# Patient Record
Sex: Male | Born: 1960
Health system: Southern US, Community
[De-identification: ages and names within clinical notes are randomized; demographics above are authoritative.]

## PROBLEM LIST (undated history)

## (undated) DIAGNOSIS — C61 Malignant neoplasm of prostate: Secondary | ICD-10-CM

## (undated) DIAGNOSIS — K219 Gastro-esophageal reflux disease without esophagitis: Principal | ICD-10-CM

## (undated) DIAGNOSIS — M7580 Other shoulder lesions, unspecified shoulder: Secondary | ICD-10-CM

## (undated) DIAGNOSIS — R9431 Abnormal electrocardiogram [ECG] [EKG]: Secondary | ICD-10-CM

## (undated) DIAGNOSIS — K222 Esophageal obstruction: Secondary | ICD-10-CM

## (undated) DIAGNOSIS — R011 Cardiac murmur, unspecified: Secondary | ICD-10-CM

## (undated) DIAGNOSIS — Z9889 Other specified postprocedural states: Secondary | ICD-10-CM

## (undated) DIAGNOSIS — D126 Benign neoplasm of colon, unspecified: Secondary | ICD-10-CM

## (undated) DIAGNOSIS — E785 Hyperlipidemia, unspecified: Secondary | ICD-10-CM

## (undated) DIAGNOSIS — R7303 Prediabetes: Secondary | ICD-10-CM

## (undated) DIAGNOSIS — D374 Neoplasm of uncertain behavior of colon: Secondary | ICD-10-CM

## (undated) DIAGNOSIS — I10 Essential (primary) hypertension: Secondary | ICD-10-CM

## (undated) DIAGNOSIS — K6389 Other specified diseases of intestine: Secondary | ICD-10-CM

## (undated) HISTORY — DX: Neoplasm of uncertain behavior of colon: D37.4

## (undated) HISTORY — DX: Gastro-esophageal reflux disease without esophagitis: K21.9

## (undated) HISTORY — PX: PROSTATE BIOPSY: SHX241

## (undated) HISTORY — DX: Benign neoplasm of colon, unspecified: D12.6

## (undated) HISTORY — DX: Esophageal obstruction: K22.2

## (undated) HISTORY — DX: Other specified diseases of intestine: K63.89

## (undated) HISTORY — DX: Essential (primary) hypertension: I10

## (undated) HISTORY — DX: Malignant neoplasm of prostate: C61

## (undated) HISTORY — DX: Other shoulder lesions, unspecified shoulder: M75.80

## (undated) HISTORY — DX: Hyperlipidemia, unspecified: E78.5

---

## 2002-08-26 ENCOUNTER — Emergency Department (HOSPITAL_COMMUNITY): Admission: EM | Admit: 2002-08-26 | Discharge: 2002-08-26 | Payer: Self-pay | Admitting: Emergency Medicine

## 2002-08-27 ENCOUNTER — Emergency Department (HOSPITAL_COMMUNITY): Admission: EM | Admit: 2002-08-27 | Discharge: 2002-08-27 | Payer: Self-pay | Admitting: Emergency Medicine

## 2004-11-28 ENCOUNTER — Ambulatory Visit: Payer: Self-pay | Admitting: Internal Medicine

## 2004-11-28 ENCOUNTER — Encounter: Admission: RE | Admit: 2004-11-28 | Discharge: 2004-11-28 | Payer: Self-pay | Admitting: Family Medicine

## 2007-11-27 ENCOUNTER — Ambulatory Visit: Payer: Self-pay | Admitting: Family Medicine

## 2007-11-27 DIAGNOSIS — I10 Essential (primary) hypertension: Secondary | ICD-10-CM | POA: Insufficient documentation

## 2007-12-01 LAB — CONVERTED CEMR LAB
ALT: 21 units/L (ref 0–53)
AST: 17 units/L (ref 0–37)
Albumin: 3.7 g/dL (ref 3.5–5.2)
Alkaline Phosphatase: 51 units/L (ref 39–117)
BUN: 10 mg/dL (ref 6–23)
Basophils Absolute: 0 10*3/uL (ref 0.0–0.1)
Basophils Relative: 0.1 % (ref 0.0–1.0)
Bilirubin, Direct: 0.1 mg/dL (ref 0.0–0.3)
CO2: 31 meq/L (ref 19–32)
Calcium: 9.1 mg/dL (ref 8.4–10.5)
Chloride: 103 meq/L (ref 96–112)
Cholesterol: 215 mg/dL (ref 0–200)
Creatinine, Ser: 1.1 mg/dL (ref 0.4–1.5)
Direct LDL: 155.4 mg/dL
Eosinophils Absolute: 0.2 10*3/uL (ref 0.0–0.6)
Eosinophils Relative: 3.1 % (ref 0.0–5.0)
GFR calc Af Amer: 93 mL/min
GFR calc non Af Amer: 77 mL/min
Glucose, Bld: 96 mg/dL (ref 70–99)
HCT: 42 % (ref 39.0–52.0)
HDL: 35 mg/dL — ABNORMAL LOW (ref >39.0)
Hemoglobin: 14.3 g/dL (ref 13.0–17.0)
Lymphocytes Relative: 39.5 % (ref 12.0–46.0)
MCHC: 34.2 g/dL (ref 30.0–36.0)
MCV: 78.7 fL (ref 78.0–100.0)
Monocytes Absolute: 0.6 10*3/uL (ref 0.2–0.7)
Monocytes Relative: 9.5 % (ref 3.0–11.0)
Neutro Abs: 2.7 10*3/uL (ref 1.4–7.7)
Neutrophils Relative %: 47.8 % (ref 43.0–77.0)
PSA: 1.14 ng/mL (ref 0.10–4.00)
Platelets: 219 10*3/uL (ref 150–400)
Potassium: 3.8 meq/L (ref 3.5–5.1)
RBC: 5.34 M/uL (ref 4.22–5.81)
RDW: 13.8 % (ref 11.5–14.6)
Sodium: 141 meq/L (ref 135–145)
TSH: 1.2 microintl units/mL (ref 0.35–5.50)
Total Bilirubin: 0.9 mg/dL (ref 0.3–1.2)
Total CHOL/HDL Ratio: 6.1
Total Protein: 7.4 g/dL (ref 6.0–8.3)
Triglycerides: 93 mg/dL (ref 0–149)
VLDL: 19 mg/dL (ref 0–40)
WBC: 5.8 10*3/uL (ref 4.5–10.5)

## 2007-12-31 ENCOUNTER — Ambulatory Visit: Payer: Self-pay | Admitting: Family Medicine

## 2007-12-31 DIAGNOSIS — E782 Mixed hyperlipidemia: Secondary | ICD-10-CM | POA: Insufficient documentation

## 2008-02-11 ENCOUNTER — Ambulatory Visit: Payer: Self-pay | Admitting: Family Medicine

## 2008-02-17 LAB — CONVERTED CEMR LAB
ALT: 19 units/L (ref 0–53)
AST: 22 units/L (ref 0–37)
BUN: 14 mg/dL (ref 6–23)
CO2: 32 meq/L (ref 19–32)
Calcium: 9.5 mg/dL (ref 8.4–10.5)
Chloride: 103 meq/L (ref 96–112)
Cholesterol: 149 mg/dL (ref 0–200)
Creatinine, Ser: 1.1 mg/dL (ref 0.4–1.5)
GFR calc Af Amer: 93 mL/min
GFR calc non Af Amer: 77 mL/min
Glucose, Bld: 96 mg/dL (ref 70–99)
HDL: 37.5 mg/dL — ABNORMAL LOW (ref >39.0)
LDL Cholesterol: 97 mg/dL (ref 0–99)
Potassium: 4.3 meq/L (ref 3.5–5.1)
Sodium: 140 meq/L (ref 135–145)
Total CHOL/HDL Ratio: 4
Triglycerides: 72 mg/dL (ref 0–149)
VLDL: 14 mg/dL (ref 0–40)

## 2008-02-29 ENCOUNTER — Telehealth (INDEPENDENT_AMBULATORY_CARE_PROVIDER_SITE_OTHER): Payer: Self-pay | Admitting: Internal Medicine

## 2008-05-02 ENCOUNTER — Ambulatory Visit: Payer: Self-pay | Admitting: Family Medicine

## 2008-05-06 ENCOUNTER — Telehealth (INDEPENDENT_AMBULATORY_CARE_PROVIDER_SITE_OTHER): Payer: Self-pay | Admitting: Internal Medicine

## 2008-05-12 ENCOUNTER — Ambulatory Visit: Payer: Self-pay | Admitting: Internal Medicine

## 2008-05-12 DIAGNOSIS — F528 Other sexual dysfunction not due to a substance or known physiological condition: Secondary | ICD-10-CM | POA: Insufficient documentation

## 2008-05-18 ENCOUNTER — Telehealth (INDEPENDENT_AMBULATORY_CARE_PROVIDER_SITE_OTHER): Payer: Self-pay | Admitting: Internal Medicine

## 2008-06-13 ENCOUNTER — Ambulatory Visit: Payer: Self-pay | Admitting: Family Medicine

## 2008-08-30 ENCOUNTER — Ambulatory Visit: Payer: Self-pay | Admitting: Family Medicine

## 2008-10-12 ENCOUNTER — Ambulatory Visit: Payer: Self-pay | Admitting: Family Medicine

## 2009-01-31 ENCOUNTER — Telehealth (INDEPENDENT_AMBULATORY_CARE_PROVIDER_SITE_OTHER): Payer: Self-pay | Admitting: Internal Medicine

## 2009-04-27 ENCOUNTER — Ambulatory Visit: Payer: Self-pay | Admitting: Family Medicine

## 2009-04-27 DIAGNOSIS — E669 Obesity, unspecified: Secondary | ICD-10-CM | POA: Insufficient documentation

## 2009-04-27 LAB — CONVERTED CEMR LAB
ALT: 24 units/L (ref 0–53)
AST: 24 units/L (ref 0–37)
BUN: 13 mg/dL (ref 6–23)
CO2: 29 meq/L (ref 19–32)
Calcium: 9.1 mg/dL (ref 8.4–10.5)
Chloride: 106 meq/L (ref 96–112)
Cholesterol: 157 mg/dL (ref 0–200)
Creatinine, Ser: 1 mg/dL (ref 0.4–1.5)
GFR calc non Af Amer: 102.57 mL/min (ref >60)
Glucose, Bld: 93 mg/dL (ref 70–99)
HDL: 37.6 mg/dL — ABNORMAL LOW (ref >39.00)
LDL Cholesterol: 105 mg/dL — ABNORMAL HIGH (ref 0–99)
PSA: 1.87 ng/mL (ref 0.10–4.00)
Potassium: 4.3 meq/L (ref 3.5–5.1)
Sodium: 140 meq/L (ref 135–145)
Total CHOL/HDL Ratio: 4
Triglycerides: 72 mg/dL (ref 0.0–149.0)
VLDL: 14.4 mg/dL (ref 0.0–40.0)

## 2009-05-25 ENCOUNTER — Ambulatory Visit: Payer: Self-pay | Admitting: Family Medicine

## 2009-05-25 DIAGNOSIS — J309 Allergic rhinitis, unspecified: Secondary | ICD-10-CM | POA: Insufficient documentation

## 2009-06-29 ENCOUNTER — Ambulatory Visit: Payer: Self-pay | Admitting: Family Medicine

## 2009-08-15 ENCOUNTER — Ambulatory Visit: Payer: Self-pay | Admitting: Family Medicine

## 2009-10-31 ENCOUNTER — Ambulatory Visit: Payer: Self-pay | Admitting: Family Medicine

## 2009-11-01 LAB — CONVERTED CEMR LAB
ALT: 20 units/L (ref 0–53)
AST: 17 units/L (ref 0–37)
BUN: 12 mg/dL (ref 6–23)
CO2: 29 meq/L (ref 19–32)
Calcium: 8.6 mg/dL (ref 8.4–10.5)
Chloride: 104 meq/L (ref 96–112)
Cholesterol: 141 mg/dL (ref 0–200)
Creatinine, Ser: 0.9 mg/dL (ref 0.4–1.5)
GFR calc non Af Amer: 115.58 mL/min (ref >60)
Glucose, Bld: 93 mg/dL (ref 70–99)
HDL: 41.3 mg/dL (ref >39.00)
LDL Cholesterol: 86 mg/dL (ref 0–99)
Potassium: 3.9 meq/L (ref 3.5–5.1)
Sodium: 141 meq/L (ref 135–145)
Total CHOL/HDL Ratio: 3
Triglycerides: 68 mg/dL (ref 0.0–149.0)
VLDL: 13.6 mg/dL (ref 0.0–40.0)

## 2009-11-20 ENCOUNTER — Ambulatory Visit: Payer: Self-pay | Admitting: Family Medicine

## 2010-01-31 ENCOUNTER — Ambulatory Visit: Payer: Self-pay | Admitting: Family Medicine

## 2010-04-25 ENCOUNTER — Ambulatory Visit: Payer: Self-pay | Admitting: Family Medicine

## 2010-11-12 ENCOUNTER — Ambulatory Visit: Payer: Self-pay | Admitting: Family Medicine

## 2010-11-13 LAB — CONVERTED CEMR LAB
ALT: 16 units/L (ref 0–53)
AST: 17 units/L (ref 0–37)
Albumin: 4 g/dL (ref 3.5–5.2)
Alkaline Phosphatase: 60 units/L (ref 39–117)
BUN: 14 mg/dL (ref 6–23)
Basophils Absolute: 0 10*3/uL (ref 0.0–0.1)
Bilirubin, Direct: 0.1 mg/dL (ref 0.0–0.3)
CO2: 30 meq/L (ref 19–32)
Calcium: 9.2 mg/dL (ref 8.4–10.5)
Chloride: 103 meq/L (ref 96–112)
Cholesterol: 214 mg/dL — ABNORMAL HIGH (ref 0–200)
Direct LDL: 153.3 mg/dL
Eosinophils Absolute: 0.2 10*3/uL (ref 0.0–0.7)
Eosinophils Relative: 3.1 % (ref 0.0–5.0)
GFR calc non Af Amer: 100.75 mL/min (ref >60)
Glucose, Bld: 115 mg/dL — ABNORMAL HIGH (ref 70–99)
HCT: 43.2 % (ref 39.0–52.0)
HDL: 42.8 mg/dL (ref >39.00)
Hemoglobin: 14.4 g/dL (ref 13.0–17.0)
Lymphocytes Relative: 46.4 % — ABNORMAL HIGH (ref 12.0–46.0)
Lymphs Abs: 3.4 10*3/uL (ref 0.7–4.0)
MCHC: 33.4 g/dL (ref 30.0–36.0)
MCV: 81.8 fL (ref 78.0–100.0)
Monocytes Absolute: 0.5 10*3/uL (ref 0.1–1.0)
Monocytes Relative: 7 % (ref 3.0–12.0)
Neutro Abs: 3.1 10*3/uL (ref 1.4–7.7)
Neutrophils Relative %: 43 % (ref 43.0–77.0)
Platelets: 235 10*3/uL (ref 150.0–400.0)
Potassium: 3.9 meq/L (ref 3.5–5.1)
RDW: 14.6 % (ref 11.5–14.6)
Sodium: 141 meq/L (ref 135–145)
TSH: 2.11 microintl units/mL (ref 0.35–5.50)
Total Bilirubin: 0.5 mg/dL (ref 0.3–1.2)
Triglycerides: 87 mg/dL (ref 0.0–149.0)
VLDL: 17.4 mg/dL (ref 0.0–40.0)
WBC: 7.3 10*3/uL (ref 4.5–10.5)

## 2010-11-28 ENCOUNTER — Ambulatory Visit: Payer: Self-pay | Admitting: Family Medicine

## 2011-01-15 NOTE — Assessment & Plan Note (Signed)
Summary: 2 MONTH FOLLOW UP/[facility]H   Vital Signs:  Patient profile:   50 year old male Height:      73 inches Weight:      265 pounds BMI:     35.09 Temp:     98.2 degrees F oral Pulse rate:   76 / minute Pulse rhythm:   regular BP sitting:   148 / 82  (left arm) Cuff size:   large  Vitals Entered By: Lewanda Rife LPN (Apr 25, 2010 10:55 AM)  Serial Vital Signs/Assessments:  Time      Position  BP       Pulse  Resp  Temp     By                     130/80                         Judith Part MD  CC: two month f/u   CC:  two month f/u.  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 Zestril 2)  ! * Hctz  Past History:  Past Medical History: Last updated: 11/20/2009 Hyperlipidemia Hypertension  Degenerative hypertrophic spurs in the lower thoracic and mid lumbar spine  Family History: Last updated: 11/29/07 Father: died of cirrhoisis liver--ETOH abuse Mother: died at 46's--MI, DM,HPB Siblings: 1 br--L&W  DM-0 MI--Maunt CVA- 0 Prostate Cancer-0 Breast Cancer-0 Ovarian Cancer-0 Uterine Cancer-0 Colon Cancer-0 Drug/ ETOH Abuse-0 Depression-   Social History: Last updated: 04/25/2010 Marital Status: divorced Children: 2--25-20 Occupation: works for the [employer]. works 2 jobs/ long hours  no time for exercise currently non smoker  Risk Factors: Alcohol Use: 0 (04/27/2009) Caffeine Use: 1 (04/27/2009) Exercise: yes (04/27/2009)  Risk Factors: Smoking Status: never (04/27/2009) Passive Smoke Exposure: yes  (29-Nov-2007)  Social History: Marital Status: divorced Children: 2--25-20 Occupation: works for the [employer]. works 2 jobs/ long hours  no time for exercise currently non smoker  Review of Systems General:  Denies fatigue, loss of appetite, and malaise. Eyes:  Denies blurring and eye irritation. CV:  Denies chest pain or discomfort, palpitations, and shortness of breath with exertion. Resp:  Denies cough, shortness of breath, and wheezing. GI:  Denies abdominal pain, change in bowel habits, and indigestion. MS:  Denies muscle aches and cramps. Derm:  Denies poor wound healing and rash. Neuro:  Denies headaches, numbness, and tingling. Psych:  mood is ok . Endo:  Denies cold intolerance, excessive thirst, excessive urination, and heat intolerance. Heme:  Denies abnormal bruising and bleeding.  Physical Exam  General:  overweight but generally well appearing  Head:  normocephalic, atraumatic, and no abnormalities observed.   Eyes:  vision grossly intact, pupils equal, pupils round, and pupils reactive to light.   Mouth:  pharynx pink and moist.   Neck:  supple with full  rom and no masses or thyromegally, no JVD or carotid bruit  Lungs:  Normal respiratory effort, chest expands symmetrically. Lungs are clear to auscultation, no crackles or wheezes. Heart:  Normal rate and regular rhythm. S1 and S2 normal without gallop, murmur, click, rub or other extra sounds. Abdomen:  no renal bruits  Msk:  No deformity or scoliosis noted of thoracic or lumbar spine.   Extremities:  No clubbing, cyanosis, edema, or deformity noted with normal full range of motion of all joints.   Neurologic:  sensation intact to light touch, gait normal, and DTRs symmetrical and normal.   Skin:  Intact without suspicious lesions or rashes Cervical Nodes:  No lymphadenopathy noted Psych:  normal affect, talkative and pleasant    Impression & Recommendations:  Problem # 1:   HYPERTENSION (ICD-401.9) Assessment Improved  much imp bp now on combo of med without side eff disc healthy diet (low simple sugar/ choose complex carbs/ low sat fat) diet and exercise in detail  plan lab and f/u for check up in december  His updated medication list for this problem includes:    Norvasc 10 Mg Tabs (Amlodipine besylate) .Marland Kitchen... 1 once daily for bp    Cozaar 50 Mg Tabs (Losartan potassium) .Marland Kitchen... 1 by mouth once daily  BP today: 148/82-- re check 130/80 L arm with large cuff Prior BP: 160/90 (01/31/2010)  Labs Reviewed: K+: 3.9 (10/31/2009) Creat: : 0.9 (10/31/2009)   Chol: 141 (10/31/2009)   HDL: 41.30 (10/31/2009)   LDL: 86 (10/31/2009)   TG: 68.0 (10/31/2009)  Problem # 2:  HYPERLIPIDEMIA (ICD-272.2) Assessment: Unchanged  well controlled on statin and diet last check  lab and f/u in dec planned is doing better avoiding sat fats in diet - rev this  His updated medication list for this problem includes:    Simvastatin 20 Mg Tabs (Simvastatin) .Marland Kitchen... 1 once daily for cholesterol  Labs Reviewed: SGOT: 17 (10/31/2009)   SGPT: 20 (10/31/2009)   HDL:41.30 (10/31/2009), 37.60 (04/27/2009)  LDL:86 (10/31/2009), 105 (78/46/9629)  Chol:141 (10/31/2009), 157 (04/27/2009)  Trig:68.0 (10/31/2009), 72.0 (04/27/2009)  Complete Medication List: 1)  Simvastatin 20 Mg Tabs (Simvastatin) .Marland Kitchen.. 1 once daily for cholesterol 2)  Norvasc 10 Mg Tabs (Amlodipine besylate) .Marland Kitchen.. 1 once daily for bp 3)  Fish Oil Oil (Fish oil) .... Three daily 4)  Multivitamins Tabs (Multiple vitamin) .... One daily 5)  Clarinex 5 Mg Tabs (Desloratadine) .Marland Kitchen.. 1 once daily for congestion as needed 6)  Cozaar 50 Mg Tabs (Losartan potassium) .Marland Kitchen.. 1 by mouth once daily  Patient Instructions: 1)  blood pressure is better today 2)  no change in medicines 3)  keep working on healthy diet and exercise 4)  schedule fasting labs and  then f/u for PE -- wellness/ lipid v70.0, 401.1   Current Allergies (reviewed  today): ! ZESTRIL ! * HCTZ

## 2011-01-15 NOTE — Assessment & Plan Note (Signed)
Summary: 2 MONTH FOLLOW UP/[facility]   Vital Signs:  Patient profile:   50 year old male Height:      73 inches Weight:      266.75 pounds BMI:     35.32 Temp:     98.5 degrees F oral Pulse rate:   76 / minute Pulse rhythm:   regular BP sitting:   160 / 90  (left arm) Cuff size:   large  Vitals Entered By: Lewanda Rife LPN (January 31, 2010 11:13 AM)  History of Present Illness: last visit -- given zestril - and that gave him some bad cough tried to call and nurse never called him back   bp is much better at home 130s /70s  worse here  that was just with norvasc  coming off hctz stopped the sexual side eff is back to working outside now     Allergies (verified): 1)  ! Zestril 2)  ! 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 works 2 jobs/ long hours  non smoker  Risk Factors: Alcohol Use: 0 (04/27/2009) Caffeine Use: 1 (04/27/2009) Exercise: yes (04/27/2009)  Risk Factors: Smoking Status: never (04/27/2009) Passive Smoke Exposure: yes (December 02, 2007)  Review of Systems General:  Denies fatigue, fever, loss of appetite, and malaise. Eyes:  Denies blurring and eye irritation. CV:  Denies chest pain or discomfort, lightheadness, near fainting, and palpitations. Resp:  Denies cough and wheezing. GI:  Denies abdominal pain, bloody stools, change in bowel habits, and indigestion. GU:  Denies urinary  frequency and urinary hesitancy. MS:  Denies joint pain, joint redness, joint swelling, and muscle aches. Derm:  Denies itching, lesion(s), poor wound healing, and rash. Neuro:  Denies headaches, numbness, tingling, visual disturbances, and weakness. Psych:  Denies anxiety and depression. Endo:  Denies cold intolerance, excessive thirst, excessive urination, and heat intolerance.  Physical Exam  General:  overweight but generally well appearing  Head:  normocephalic, atraumatic, and no abnormalities observed.   Eyes:  vision grossly intact, pupils equal, pupils round, and pupils reactive to light.   Mouth:  pharynx pink and moist.   Neck:  supple with full rom and no masses or thyromegally, no JVD or carotid bruit  Chest Wall:  No deformities, masses, tenderness or gynecomastia noted. Lungs:  Normal respiratory effort, chest expands symmetrically. Lungs are clear to auscultation, no crackles or wheezes. Heart:  Normal rate and regular rhythm. S1 and S2 normal without gallop, murmur, click, rub or other extra sounds. Abdomen:  no renal bruits  Msk:  No deformity or scoliosis noted of thoracic or lumbar spine.   Pulses:  R and L carotid,radial,femoral,dorsalis pedis and posterior tibial pulses are full and equal bilaterally Extremities:  No clubbing, cyanosis, edema, or deformity noted with normal full range of motion of all joints.   Neurologic:  sensation intact to light touch, gait normal, and DTRs symmetrical and normal.   Skin:  Intact without suspicious lesions or rashes Cervical Nodes:  No lymphadenopathy noted Psych:  normal affect, talkative and pleasant  Impression & Recommendations:  Problem # 1:  HYPERTENSION (ICD-401.9) Assessment Deteriorated intolerant of ace with cough-- so stopped that and bp back up trial of cozaar 50-and will update if side eff  disc lifestyle change- more active now f/u 2 mo  The following medications were removed from the medication list:     Zestril 40 Mg Tabs (Lisinopril) .Marland Kitchen... 1 by mouth once daily His updated medication list for this problem includes:    Norvasc 10 Mg Tabs (Amlodipine besylate) .Marland Kitchen... 1 once daily for bp    Cozaar 50 Mg Tabs (Losartan potassium) .Marland Kitchen... 1 by mouth once daily  Orders: Prescription Created Electronically [phone])  Complete Medication List: 1)  Simvastatin 20 Mg Tabs (Simvastatin) .Marland Kitchen.. 1 once daily for cholesterol 2)  Norvasc 10 Mg Tabs (Amlodipine besylate) .Marland Kitchen.. 1 once daily for bp 3)  Fish Oil Oil (Fish oil) .... Three daily 4)  Multivitamins Tabs (Multiple vitamin) .... One daily 5)  Clarinex 5 Mg Tabs (Desloratadine) .Marland Kitchen.. 1 once daily for congestion as needed 6)  Cozaar 50 Mg Tabs (Losartan potassium) .Marland Kitchen.. 1 by mouth once daily  Patient Instructions: 1)  keep checking your blood pressure at home  2)  stay as active as you can  3)  avoid salt and drink a lot of water  4)  follow up with me in 2 months 5)  start cozaar- update me if any side effects or problems 6)  I sent that px to your pharmacy  Prescriptions: COZAAR 50 MG TABS (LOSARTAN POTASSIUM) 1 by mouth once daily  #30 x 11   Entered and Authorized by:   Judith Part MD   Signed by:   Judith Part MD on 01/31/2010   Method used:   Electronically to        [id number] ([facility])       [address]       [place], [place]  [address]       Ph: [phone]       Fax: [phone]   RxID:   [id number]   Current Allergies (reviewed today): ! ZESTRIL ! * HCTZ

## 2011-01-17 NOTE — Assessment & Plan Note (Signed)
Summary: acid reflux/do   Vital Signs:  Patient profile:   50 year old male Height:      73 inches Weight:      269.25 pounds BMI:     35.65 Temp:     98.1 degrees F oral Pulse rate:   84 / minute Pulse rhythm:   regular BP sitting:   150 / 96  (left arm) Cuff size:   large  Vitals Entered By: Delilah Shan CMA Duncan Dull) (November 28, 2010 4:18 PM) CC: acid reflux   History of Present Illness: "Heartburn for about 1 week".  Worse after eating.  Occ burping.  No FC.  Occ nausea.  No pain in arm or jaw.  Occ cough noted with burning in throat.  Can happen at rest.  No related to exertion.  Not SOB.  Taking prilosec with some relief.  No tob, no alcohol.    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  Eating breakfast, lunch and occ skips dinner due to second job.  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  Sleeping okay.  Cough at night.  No NSAIDS.    Allergies: 1)  ! Zestril 2)  ! * Hctz  Social History: Marital Status: divorced, lives wtih girlfriend Children: 2 Occupation: works for the [employer]. works 2 jobs/ long hours  no time for exercise currently non smoker Lives in [place]  Review of Systems       See HPI.  Otherwise negative.    Physical Exam  General:  NAD NCAT, MMM neck w/o LA, supple RRR clear to auscultation bilaterally  abdomen soft ext w/o edema    Impression & Recommendations:  Problem # 1:  HEARTBURN (ICD-787.1) Improving on PPI.  30day sample of prilosec given.  D/w patient ZO:XWRUEA loss, elevating head of bed and using prilosec.  If persistent, would consider further work up (ie possible H pylori).  Okay for outpatient follow up.  He agrees with plan.  D/w patient about avoid triggers and relevant anatomy d/w patient.   Complete Medication List: 1)  Simvastatin 20 Mg Tabs (Simvastatin) .Marland Kitchen.. 1 once daily for cholesterol 2)  Norvasc 10 Mg Tabs (Amlodipine besylate) .Marland Kitchen.. 1 once daily for bp 3)   Multivitamins Tabs (Multiple vitamin) .... One daily 4)  Clarinex 5 Mg Tabs (Desloratadine) .Marland Kitchen.. 1 once daily for congestion as needed 5)  Cozaar 50 Mg Tabs (Losartan potassium) .Marland Kitchen.. 1 by mouth once daily 6)  Prilosec 20 Mg Cpdr (Omeprazole) .Marland Kitchen.. 1 by mouth once daily  Patient Instructions: 1)  Elevate the head of your bed, don't take ibuprofen or aleve, and take 1 prilosec a day.  Try to get off the medicine in about 1 month.  If you aren't getting better, let me know.  Try to work on losing weight gradually.  Take care.    Orders Added: 1)  Est. Patient Level III [54098]    Current Allergies (reviewed today): ! ZESTRIL ! * HCTZ

## 2011-01-29 ENCOUNTER — Ambulatory Visit (INDEPENDENT_AMBULATORY_CARE_PROVIDER_SITE_OTHER): Payer: 59 | Admitting: Family Medicine

## 2011-01-29 ENCOUNTER — Encounter: Payer: Self-pay | Admitting: Family Medicine

## 2011-01-29 DIAGNOSIS — B9789 Other viral agents as the cause of diseases classified elsewhere: Secondary | ICD-10-CM

## 2011-01-30 ENCOUNTER — Other Ambulatory Visit: Payer: Self-pay | Admitting: Family Medicine

## 2011-01-30 ENCOUNTER — Other Ambulatory Visit (INDEPENDENT_AMBULATORY_CARE_PROVIDER_SITE_OTHER): Payer: 59

## 2011-01-30 ENCOUNTER — Encounter (INDEPENDENT_AMBULATORY_CARE_PROVIDER_SITE_OTHER): Payer: Self-pay | Admitting: *Deleted

## 2011-01-30 DIAGNOSIS — I10 Essential (primary) hypertension: Secondary | ICD-10-CM

## 2011-01-30 DIAGNOSIS — E785 Hyperlipidemia, unspecified: Secondary | ICD-10-CM

## 2011-01-30 DIAGNOSIS — E782 Mixed hyperlipidemia: Secondary | ICD-10-CM

## 2011-01-30 LAB — BASIC METABOLIC PANEL
BUN: 16 mg/dL (ref 6–23)
Calcium: 9.2 mg/dL (ref 8.4–10.5)
Chloride: 107 mEq/L (ref 96–112)
Creatinine, Ser: 1 mg/dL (ref 0.4–1.5)
GFR: 97.31 mL/min (ref >60.00)
Potassium: 4.4 mEq/L (ref 3.5–5.1)
Sodium: 141 mEq/L (ref 135–145)

## 2011-01-30 LAB — HEPATIC FUNCTION PANEL
AST: 16 U/L (ref 0–37)
Albumin: 3.9 g/dL (ref 3.5–5.2)
Alkaline Phosphatase: 57 U/L (ref 39–117)
Bilirubin, Direct: 0.1 mg/dL (ref 0.0–0.3)
Total Bilirubin: 0.6 mg/dL (ref 0.3–1.2)
Total Protein: 7.4 g/dL (ref 6.0–8.3)

## 2011-01-30 LAB — LDL CHOLESTEROL, DIRECT: Direct LDL: 150.5 mg/dL

## 2011-01-30 LAB — LIPID PANEL
Cholesterol: 205 mg/dL — ABNORMAL HIGH (ref 0–200)
HDL: 42.2 mg/dL (ref >39.00)
Total CHOL/HDL Ratio: 5
Triglycerides: 74 mg/dL (ref 0.0–149.0)
VLDL: 14.8 mg/dL (ref 0.0–40.0)

## 2011-02-01 ENCOUNTER — Encounter: Payer: 59 | Admitting: Family Medicine

## 2011-02-06 NOTE — Assessment & Plan Note (Signed)
Summary: CONGESTION,WHEEZING/CLE  [facility]   Vital Signs:  Patient profile:   50 year old male Height:      73 inches Weight:      263 pounds BMI:     34.82 O2 Sat:      98 % on Room air Temp:     97.9 degrees F oral Pulse rate:   88 / minute Pulse rhythm:   regular BP sitting:   144 / 80  (left arm) Cuff size:   large  Vitals Entered By: Delilah Shan CMA Duncan Dull) (January 29, 2011 9:14 AM)  O2 Flow:  Room air CC: Congestion, wheezing   History of Present Illness: Some chest congestion.  Sx going for a week.  + Sick contacts.   No wheeze/cough per patient.  Some pain on R side of chest, intermittent, not worse with deep breath.  It may or may not happen with rest or activity.  No fevers but + chills.  Some nausea.  "It feels like I'm breathing cold air- like I'm more sensitive to it."  the dull pain coincides with the recent symptoms.  Exercise tolerance had been good/baseline w/o symptoms until this past week.  No L sided CP, no pain in L arm.  Has had some mild aches recently.    Allergies: 1)  ! Zestril 2)  ! * Hctz  Review of Systems       See HPI.  Otherwise negative.    Physical Exam  General:  no apparent distress normocephalic atraumatic tm wnl op wnl but nasal irritation noted neck supple w/o LA regular rate and rhythm  clear to auscultation bilaterally, no wheeze/rales/ronchi r chest tender to palpation on anterior/inferior ribs w/o skin changes ext well perfused   Impression & Recommendations:  Problem # 1:  VIRAL INFECTION-UNSPEC (ICD-079.99) I think this is likely a benign process that should resolve.  With the aches and chills it is likely an infectious process.  He is nontoxic and I don't suspect sig chest pathology, esp with his exam today.  follow up as needed.  Supportive tx o/w, he agrees.   I talked to him about his labs and he needs to reschedule his physical.  He'll work on this.  he knows his sugar was mildly elevated and this needs attention-  diet/exercise/weight loss.  Complete Medication List: 1)  Simvastatin 20 Mg Tabs (Simvastatin) .Marland Kitchen.. 1 once daily for cholesterol 2)  Norvasc 10 Mg Tabs (Amlodipine besylate) .Marland Kitchen.. 1 once daily for bp 3)  Multivitamins Tabs (Multiple vitamin) .... One daily 4)  Clarinex 5 Mg Tabs (Desloratadine) .Marland Kitchen.. 1 once daily for congestion as needed 5)  Cozaar 50 Mg Tabs (Losartan potassium) .Marland Kitchen.. 1 by mouth once daily 6)  Prilosec 20 Mg Cpdr (Omeprazole) .Marland Kitchen.. 1 by mouth once daily  Patient Instructions: 1)  Get plenty of rest, drink lots of clear liquids, and use Tylenol for fever and comfort.  Let us know if you continue to have symptoms.  Notify a doctor if you get short of breath or have an increase in chest pain.  This should gradually get better.     Orders Added: 1)  Est. Patient Level III [[address]]    Current Allergies (reviewed today): ! ZESTRIL ! * HCTZ

## 2011-02-06 NOTE — Letter (Signed)
Summary: Out of Work  [facility]  [address] [place], [place] [address]   Phone: [phone]  Fax: [phone]    January 29, 2011   Employee:  Daniel [name]    To Whom It May Concern:   For Medical reasons, please excuse the above named employee from work for the following dates:  Start:   today   End:   return 02/04/11, potentially contagious in the meantime  If you need additional information, please feel free to contact our office.         Sincerely,    Crawford Givens MD

## 2011-03-09 ENCOUNTER — Encounter: Payer: Self-pay | Admitting: Family Medicine

## 2011-04-30 ENCOUNTER — Other Ambulatory Visit: Payer: Self-pay | Admitting: *Deleted

## 2011-04-30 MED ORDER — LOSARTAN POTASSIUM 50 MG PO TABS: 50.0000 mg | ORAL_TABLET | Freq: Every day | ORAL | Status: DC

## 2011-06-14 ENCOUNTER — Encounter: Payer: Self-pay | Admitting: Family Medicine

## 2011-06-14 ENCOUNTER — Ambulatory Visit (INDEPENDENT_AMBULATORY_CARE_PROVIDER_SITE_OTHER): Payer: 59 | Admitting: Family Medicine

## 2011-06-14 DIAGNOSIS — I10 Essential (primary) hypertension: Secondary | ICD-10-CM

## 2011-06-14 DIAGNOSIS — E782 Mixed hyperlipidemia: Secondary | ICD-10-CM

## 2011-06-14 DIAGNOSIS — R079 Chest pain, unspecified: Secondary | ICD-10-CM | POA: Insufficient documentation

## 2011-06-14 LAB — LDL CHOLESTEROL, DIRECT: Direct LDL: 148.9 mg/dL

## 2011-06-14 LAB — CK TOTAL AND CKMB (NOT AT ARMC)
CK, MB: 1.5 ng/mL (ref 0.3–4.0)
Relative Index: 0.8 (ref 0.0–2.5)
Total CK: 180 U/L (ref 7–232)

## 2011-06-14 LAB — TROPONIN I: Troponin I: <0.01 ng/mL (ref <0.06)

## 2011-06-14 MED ORDER — OMEPRAZOLE 20 MG PO CPDR: 40.0000 mg | DELAYED_RELEASE_CAPSULE | Freq: Every day | ORAL | Status: DC

## 2011-06-14 NOTE — Patient Instructions (Addendum)
I don't think this is coming from the chest, likely more reflux. increase simvastatin to 40 mg daily (2 pills). Start omeprazole 40mg  daily (2 pills of 20mg ) to see if can help with symptoms. Avoid of citrus, fatty foods, chocolate, peppermint, and excessive alcohol, along with sodas, orange juice (acidic drinks) At least a few hours between dinner and bed, minimize naps after eating. Return in 1 week for follow up with PCP.  Call us with questions. We will call you at [phone] with results.  If you haven't heard by 2pm call us. If not improving as expected, or return of chest pain, please seek urgent medical evaluation.

## 2011-06-14 NOTE — Progress Notes (Signed)
Subjective:    Patient ID: Daniel [name], male    DOB: [birth year], 50 y.o.   MRN: [id number]  HPI CC: chest pain  50yo with h/o HTN, HLD, nonsmoker presents with 2 wk h/o substernal chest pain traveling to left breast.  Pain described as burning in mid chest, left breast pain described as "like someone with a fist in my chest".  Not sharp stabbing, dull achey, no pressure/tightness sensation.  Associated with nausea, not diaphoresis.  Not exhertional.  Occasional shortness of breath.  Movement makes pain better.  Nothing makes it worse.  Sometimes bad.  Did have 1 episode severe acid reflux 2 mo ago.  Currently 5/10.  Worst pain Wednesday 8-9/10, lasted 10-15 min.    Nonsmoker, no EtOH.  On omeprazole 20mg  daily, stopped taking 2 mo ago.    At home bp 137/80 last night.  Previously 160 sbp 3 days ago. Tends to run high occasionally.  Endorses eating good.  Lab Results  Component Value Date   LDLCALC 86 10/31/2009  + family history - mom with CAD/MI at age 72.    Seen 11/2010 with 1 wk h/o chest pain, dx with reflux and pain actually improved with omeprazole.  Medications and allergies reviewed and updated in chart. Patient Active Problem List  Diagnoses  . HYPERLIPIDEMIA  . OBESITY  . ERECTILE DYSFUNCTION  . HYPERTENSION  . HEMORRHOIDS  . ALLERGIC RHINITIS  . HEARTBURN   Past Medical History  Diagnosis Date  . Hyperlipidemia   . Hypertension   . AC (acromioclavicular) joint bone spurs     degenerative hypertrophic spurs in the lower thoracic and mid lumbar spine   No past surgical history on file. History  Substance Use Topics  . Smoking status: Never Smoker   . Smokeless tobacco: Not on file  . Alcohol Use: Not on file   Family History  Problem Relation Age of Onset  . Diabetes Mother   . Heart disease Mother 65    MI  . Hypertension Mother   . Alcohol abuse Father     cirrhoisis of liver-- ETOH abuse   Allergies  Allergen Reactions  . Lisinopril    REACTION: coughing   Current Outpatient Prescriptions on File Prior to Visit  Medication Sig Dispense Refill  . amLODipine (NORVASC) 10 MG tablet Take 10 mg by mouth daily. 1 daily for bp       . losartan (COZAAR) 50 MG tablet Take 1 tablet (50 mg total) by mouth daily.  30 tablet  2  . Multiple Vitamin (MULTIVITAMIN) tablet Take 1 tablet by mouth daily.        Marland Kitchen omeprazole (PRILOSEC) 20 MG capsule Take 20 mg by mouth daily.        . simvastatin (ZOCOR) 20 MG tablet Take 20 mg by mouth daily. For cholesterol       . desloratadine (CLARINEX) 5 MG tablet Take 5 mg by mouth daily. 1 daily as needed prn        Review of Systems Per HPI    Objective:   Physical Exam  Nursing note and vitals reviewed. Constitutional: He appears well-developed and well-nourished. No distress.  HENT:  Head: Normocephalic and atraumatic.  Mouth/Throat: Oropharynx is clear and moist. No oropharyngeal exudate.  Eyes: Conjunctivae and EOM are normal. Pupils are equal, round, and reactive to light. No scleral icterus.  Neck: Normal range of motion. Neck supple.  Cardiovascular: Normal rate, regular rhythm, normal heart sounds and intact distal pulses.  No murmur heard. Pulmonary/Chest: Effort normal and breath sounds normal. No respiratory distress. He has no wheezes. He has no rales. He exhibits no tenderness.  Abdominal: Soft. Bowel sounds are normal. He exhibits no distension and no mass. There is no tenderness. There is no rebound and no guarding.  Skin: Skin is warm and dry. No rash noted.  Psychiatric: He has a normal mood and affect.          Assessment & Plan:

## 2011-06-14 NOTE — Assessment & Plan Note (Addendum)
EKG - NSR 73, no acute ST/T changes.  Nl axis, intervals, no hypertrophy.  One PAC.  Doubt cardiac.  Not pressure/tightness sensation but rather burning, not exhertional. Risk factors present include HTN and HLD with last LDL 150s, obesity. Mother with CAD/MI at age 49yo. Body mass index is 33.97 kg/(m^2). Small improvement with gi cocktail. Obtained stat cardiac enzymes, normal.  Called to discuss, pt states chest pain had resolved. recommend start taking omeprazole 40mg  daily. To go to ER if not resolving with omeprazole.

## 2011-06-14 NOTE — Assessment & Plan Note (Signed)
Elevated today, stressed and anxious, rushing here.   Return 1 wk for f/u, if remains elevated, consider titrating up cozaar.

## 2011-06-14 NOTE — Assessment & Plan Note (Addendum)
Last LDL this year 150s.  Recommend increase in simvastatin but then noted pt is on amlodipine. Consider change to more potent statin, will defer to PCP.

## 2011-06-15 ENCOUNTER — Telehealth: Payer: Self-pay | Admitting: *Deleted

## 2011-06-15 NOTE — Telephone Encounter (Signed)
Noted- pt was already informed of labs by Dr Sharen Hones

## 2011-06-15 NOTE — Telephone Encounter (Signed)
Triage Record Num: [id number] Operator: Tarri Glenn Patient Name: Daniel [name] Call Date & Time: 06/14/2011 8:08:53PM Patient Phone: [phone] PCP: Ruthe Mannan Patient Gender: Male PCP Fax : [phone] Patient DOB: October 31, 1961 Practice Name: [facility] Reason for Call: [facility] lab calling about CKMB. Lab was ordered by Dr. Sharen Hones. Total CK 180. CKMB 1.5 Triponin I < 0.01. Relative index 0.8. Protocol(s) Used: Office Note Recommended Outcome per Protocol: Information Noted and Sent to Office Reason for Outcome: Caller information to office Care Advice: ~ 06/

## 2011-06-18 ENCOUNTER — Telehealth: Payer: Self-pay | Admitting: *Deleted

## 2011-06-18 NOTE — Telephone Encounter (Signed)
Received fax from [facility].  Fax is in your IN box.

## 2011-06-18 NOTE — Telephone Encounter (Signed)
Normal CE x 1.  Already discussed with pt.

## 2011-06-21 ENCOUNTER — Encounter: Payer: Self-pay | Admitting: Family Medicine

## 2011-06-21 ENCOUNTER — Ambulatory Visit (INDEPENDENT_AMBULATORY_CARE_PROVIDER_SITE_OTHER): Payer: 59 | Admitting: Family Medicine

## 2011-06-21 DIAGNOSIS — I10 Essential (primary) hypertension: Secondary | ICD-10-CM

## 2011-06-21 DIAGNOSIS — K219 Gastro-esophageal reflux disease without esophagitis: Secondary | ICD-10-CM

## 2011-06-21 MED ORDER — OMEPRAZOLE 20 MG PO CPDR: 20.0000 mg | DELAYED_RELEASE_CAPSULE | Freq: Every day | ORAL | Status: DC

## 2011-06-21 NOTE — Patient Instructions (Signed)
Keep taking the omeprazole for now, 1 a day.  After 2-3 weeks, you may be able to cut back to every other day. In the meantime, check your blood pressure in the morning.  Get out of bed, urinate, sit down, then check your pressure.  Call me in about 2 weeks with an update on your pressure and symptoms.  Take care.

## 2011-06-21 NOTE — Progress Notes (Signed)
HTN- Bp had been variable at home, occ in 130/70s.  See below. We talked about options today.  No ble edema.  Compliant.   Gerd f/u, improved, head of bed elevated.  Labs d/w pt.  He is on PPI and doing better with fewer and milder episodes. No CP currently.  No vomiting.  No blood in stool per patient.  Not sob and no exertional sx.   Meds, vitals, and allergies reviewed.   ROS: See HPI.  Otherwise, noncontributory.  GEN: nad, alert and oriented HEENT: mucous membranes moist NECK: supple w/o LA CV: rrr PULM: ctab, no inc wob ABD: soft, +bs EXT: no edema SKIN: no acute rash

## 2011-06-23 ENCOUNTER — Encounter: Payer: Self-pay | Admitting: Family Medicine

## 2011-06-23 DIAGNOSIS — K219 Gastro-esophageal reflux disease without esophagitis: Secondary | ICD-10-CM | POA: Insufficient documentation

## 2011-06-23 NOTE — Assessment & Plan Note (Signed)
Continue PPI for now. Head of bed already elevated.  Work on weight and taper of PPI.

## 2011-06-23 NOTE — Assessment & Plan Note (Signed)
He'll check BP at home and we can make plans about lipids after that.  He is working on weight.

## 2011-06-24 ENCOUNTER — Other Ambulatory Visit: Payer: Self-pay | Admitting: *Deleted

## 2011-06-24 MED ORDER — AMLODIPINE BESYLATE 10 MG PO TABS: 10.0000 mg | ORAL_TABLET | Freq: Every day | ORAL | Status: DC

## 2011-06-24 NOTE — Telephone Encounter (Signed)
Received faxed refill request from pharmacy. Refill sent to pharmacy electronically. 

## 2011-07-08 ENCOUNTER — Telehealth: Payer: Self-pay | Admitting: *Deleted

## 2011-07-08 DIAGNOSIS — I10 Essential (primary) hypertension: Secondary | ICD-10-CM

## 2011-07-08 DIAGNOSIS — E7800 Pure hypercholesterolemia, unspecified: Secondary | ICD-10-CM

## 2011-07-08 NOTE — Telephone Encounter (Signed)
Please call pt.  BP has been usually in 130s/70s-low 80s.  No change in BP meds.  I would work on diet/weight in meantime, recheck lipids this fall (ie 10/12) at lab visit and then have f/u OV after that.  Continue simvastatin in meantime. Thanks.

## 2011-07-08 NOTE — Telephone Encounter (Signed)
Patient brought it list of blood pressure readings. List is in your inbox.

## 2011-07-09 NOTE — Telephone Encounter (Signed)
Patient notified as instructed by telephone. Follow-up appointments scheduled. 

## 2011-07-12 ENCOUNTER — Other Ambulatory Visit: Payer: Self-pay | Admitting: *Deleted

## 2011-07-12 MED ORDER — SIMVASTATIN 20 MG PO TABS: 20.0000 mg | ORAL_TABLET | Freq: Every day | ORAL | Status: DC

## 2011-09-16 ENCOUNTER — Other Ambulatory Visit: Payer: Self-pay | Admitting: Family Medicine

## 2011-09-24 ENCOUNTER — Other Ambulatory Visit: Payer: Self-pay | Admitting: Family Medicine

## 2011-09-24 NOTE — Telephone Encounter (Signed)
Will route to PCP 

## 2011-09-25 NOTE — Telephone Encounter (Signed)
Sent!

## 2011-10-11 ENCOUNTER — Other Ambulatory Visit (INDEPENDENT_AMBULATORY_CARE_PROVIDER_SITE_OTHER): Payer: 59

## 2011-10-11 DIAGNOSIS — I10 Essential (primary) hypertension: Secondary | ICD-10-CM

## 2011-10-11 DIAGNOSIS — E7800 Pure hypercholesterolemia, unspecified: Secondary | ICD-10-CM

## 2011-10-11 LAB — LIPID PANEL
Cholesterol: 159 mg/dL (ref 0–200)
LDL Cholesterol: 96 mg/dL (ref 0–99)
Total CHOL/HDL Ratio: 4
Triglycerides: 94 mg/dL (ref 0.0–149.0)

## 2011-10-18 ENCOUNTER — Ambulatory Visit (INDEPENDENT_AMBULATORY_CARE_PROVIDER_SITE_OTHER): Payer: 59 | Admitting: Family Medicine

## 2011-10-18 ENCOUNTER — Encounter: Payer: Self-pay | Admitting: Internal Medicine

## 2011-10-18 ENCOUNTER — Encounter: Payer: Self-pay | Admitting: Family Medicine

## 2011-10-18 DIAGNOSIS — Z1211 Encounter for screening for malignant neoplasm of colon: Secondary | ICD-10-CM

## 2011-10-18 DIAGNOSIS — I10 Essential (primary) hypertension: Secondary | ICD-10-CM

## 2011-10-18 DIAGNOSIS — Z860100 Personal history of colon polyps, unspecified: Secondary | ICD-10-CM | POA: Insufficient documentation

## 2011-10-18 DIAGNOSIS — K219 Gastro-esophageal reflux disease without esophagitis: Secondary | ICD-10-CM

## 2011-10-18 DIAGNOSIS — Z23 Encounter for immunization: Secondary | ICD-10-CM

## 2011-10-18 DIAGNOSIS — Z125 Encounter for screening for malignant neoplasm of prostate: Secondary | ICD-10-CM | POA: Insufficient documentation

## 2011-10-18 DIAGNOSIS — E782 Mixed hyperlipidemia: Secondary | ICD-10-CM

## 2011-10-18 DIAGNOSIS — Z0000 Encounter for general adult medical examination without abnormal findings: Secondary | ICD-10-CM

## 2011-10-18 LAB — PSA: PSA: 1.77 ng/mL (ref 0.10–4.00)

## 2011-10-18 NOTE — Progress Notes (Signed)
CPE- See plan.  Routine anticipatory guidance given to patient.  See health maintenance.  Hypertension:    Using medication without problems or lightheadedness: yes Chest pain with exertion:no Edema:no Short of breath:no Average home BPs: had been controlled on outside checks  Elevated Cholesterol: Using medications without problems: yes Muscle aches: no Diet compliance: "I was doing okay until homecoming." Exercise: yes, mainly at night  Chest discomfort is resolved and is now if off PPI.  Doing well.   Prostate cancer screening. Normal DRE today.  We talked about guidelines.  PSA wnl today.    PMH and SH reviewed  Meds, vitals, and allergies reviewed.   ROS: See HPI.  Otherwise negative.    GEN: nad, alert and oriented HEENT: mucous membranes moist NECK: supple w/o LA CV: rrr. PULM: ctab, no inc wob ABD: soft, +bs EXT: no edema SKIN: no acute rash Prostate gland firm and smooth, no enlargement, nodularity, tenderness, mass, asymmetry or induration.

## 2011-10-18 NOTE — Patient Instructions (Addendum)
Keep an eye on your pressure and if it stays up let me know.  Keep exercising and working on your diet.  You can get your results through our phone system.  Follow the instructions on the blue card. Take care. Glad to see you.  See Shirlee Limerick about your referral before your leave today.

## 2011-10-18 NOTE — Assessment & Plan Note (Signed)
D/w patient NW:[id number] for colon cancer screening, including IFOB vs. colonoscopy.  Risks and benefits of both were discussed and patient voiced understanding.  Pt elects for: colonoscopy

## 2011-10-19 ENCOUNTER — Encounter: Payer: Self-pay | Admitting: Family Medicine

## 2011-10-19 NOTE — Assessment & Plan Note (Signed)
Work on diet, weight.  Continue current meds.  He'd rather work on weight than add meds .

## 2011-10-19 NOTE — Assessment & Plan Note (Signed)
Improved. Off meds now.

## 2011-10-19 NOTE — Assessment & Plan Note (Signed)
PSA and DRE wnl.

## 2011-10-19 NOTE — Assessment & Plan Note (Signed)
Continue work on diet, weight.  Continue meds.  Labs d/w pt.

## 2011-10-19 NOTE — Assessment & Plan Note (Signed)
Flu shot done, healthy habits encouraged.  Diet/exercise d/w pt .

## 2011-11-05 ENCOUNTER — Ambulatory Visit (AMBULATORY_SURGERY_CENTER): Payer: 59 | Admitting: *Deleted

## 2011-11-05 ENCOUNTER — Encounter: Payer: Self-pay | Admitting: Internal Medicine

## 2011-11-05 VITALS — Ht 73.0 in | Wt 250.0 lb

## 2011-11-05 DIAGNOSIS — Z1211 Encounter for screening for malignant neoplasm of colon: Secondary | ICD-10-CM

## 2011-11-05 MED ORDER — PEG-KCL-NACL-NASULF-NA ASC-C 100 G PO SOLR: ORAL | Status: DC

## 2011-11-16 DIAGNOSIS — D126 Benign neoplasm of colon, unspecified: Secondary | ICD-10-CM

## 2011-11-16 HISTORY — DX: Benign neoplasm of colon, unspecified: D12.6

## 2011-11-21 ENCOUNTER — Ambulatory Visit (AMBULATORY_SURGERY_CENTER): Payer: 59 | Admitting: Internal Medicine

## 2011-11-21 ENCOUNTER — Encounter: Payer: Self-pay | Admitting: Internal Medicine

## 2011-11-21 VITALS — BP 149/82 | HR 68 | Temp 96.7°F | Resp 17 | Ht 73.0 in | Wt 250.0 lb

## 2011-11-21 DIAGNOSIS — D126 Benign neoplasm of colon, unspecified: Secondary | ICD-10-CM

## 2011-11-21 DIAGNOSIS — D1330 Benign neoplasm of unspecified part of small intestine: Secondary | ICD-10-CM

## 2011-11-21 DIAGNOSIS — R933 Abnormal findings on diagnostic imaging of other parts of digestive tract: Secondary | ICD-10-CM

## 2011-11-21 DIAGNOSIS — Z1211 Encounter for screening for malignant neoplasm of colon: Secondary | ICD-10-CM

## 2011-11-21 HISTORY — PX: COLONOSCOPY: SHX174

## 2011-11-21 MED ORDER — SODIUM CHLORIDE 0.9 % IV SOLN: 500.0000 mL | INTRAVENOUS | Status: DC

## 2011-11-21 NOTE — Patient Instructions (Addendum)
I removed two polyps today. There is another very large polyp that was biopsied only and there was also an abnormality of the appendix that was biopsied. Once these results are in we will contact you to give you the information and plan what is needed to deal with the large polyp and the abnormal appendix. Iva Boop, MD, [facility]  Please refer to your blue and neon green sheets for instructions regarding diet and activity for the rest of today.  You may resume your medications as you would normally take them.   Colon Polyps A polyp is extra tissue that grows inside your body. Colon polyps grow in the large intestine. The large intestine, also called the colon, is part of your digestive system. It is a long, hollow tube at the end of your digestive tract where your body makes and stores stool. Most polyps are not dangerous. They are benign. This means they are not cancerous. But over time, some types of polyps can turn into cancer. Polyps that are smaller than a pea are usually not harmful. But larger polyps could someday become or may already be cancerous. To be safe, doctors remove all polyps and test them.  WHO GETS POLYPS? Anyone can get polyps, but certain people are more likely than others. You may have a greater chance of getting polyps if:  You are over 50.   You have had polyps before.   Someone in your family has had polyps.   Someone in your family has had cancer of the large intestine.   Find out if someone in your family has had polyps. You may also be more likely to get polyps if you:   Eat a lot of fatty foods.   Smoke.   Drink alcohol.   Do not exercise.   Eat too much.  SYMPTOMS  Most small polyps do not cause symptoms. People often do not know they have one until their caregiver finds it during a regular checkup or while testing them for something else. Some people do have symptoms like these:  Bleeding from the anus. You might notice blood on your underwear or  on toilet paper after you have had a bowel movement.   Constipation or diarrhea that lasts more than a week.   Blood in the stool. Blood can make stool look black or it can show up as red streaks in the stool.  If you have any of these symptoms, see your caregiver. HOW DOES THE DOCTOR TEST FOR POLYPS? The doctor can use four tests to check for polyps:  Digital rectal exam. The caregiver wears gloves and checks your rectum (the last part of the large intestine) to see if it feels normal. This test would find polyps only in the rectum. Your caregiver may need to do one of the other tests listed below to find polyps higher up in the intestine.   Barium enema. The caregiver puts a liquid called barium into your rectum before taking x-rays of your large intestine. Barium makes your intestine look white in the pictures. Polyps are dark, so they are easy to see.   Sigmoidoscopy. With this test, the caregiver can see inside your large intestine. A thin flexible tube is placed into your rectum. The device is called a sigmoidoscope, which has a light and a tiny video camera in it. The caregiver uses the sigmoidoscope to look at the last third of your large intestine.   Colonoscopy. This test is like sigmoidoscopy, but the caregiver  looks at all of the large intestine. It usually requires sedation. This is the most common method for finding and removing polyps.  TREATMENT   The caregiver will remove the polyp during sigmoidoscopy or colonoscopy. The polyp is then tested for cancer.   If you have had polyps, your caregiver may want you to get tested regularly in the future.  PREVENTION  There is not one sure way to prevent polyps. You might be able to lower your risk of getting them if you:  Eat more fruits and vegetables and less fatty food.   Do not smoke.   Avoid alcohol.   Exercise every day.   Lose weight if you are overweight.   Eating more calcium and folate can also lower your risk of  getting polyps. Some foods that are rich in calcium are milk, cheese, and broccoli. Some foods that are rich in folate are chickpeas, kidney beans, and spinach.   Aspirin might help prevent polyps. Studies are under way.  Document Released: 08/28/2004 Document Revised: 08/14/2011 Document Reviewed: 02/03/2008 [facility] Patient Information 2012 [place], [place].

## 2011-11-21 NOTE — Op Note (Signed)
[facility] [address] [place], [place]  [address]  COLONOSCOPY PROCEDURE REPORT  PATIENT:  Daniel, [name]  MR#:  [id number] BIRTHDATE:  17-May-1961, 50 yrs. old  GENDER:  male ENDOSCOPIST:  Iva Boop, MD, [facility] REF. BY:  Crawford Givens, M.D. PROCEDURE DATE:  11/21/2011 PROCEDURE:  Colonoscopy with biopsy and snare polypectomy ASA CLASS:  Class II INDICATIONS:  Routine Risk Screening MEDICATIONS:   These medications were titrated to patient response per physician's verbal order, Fentanyl 50 mcg IV, Versed 8 mg IV  DESCRIPTION OF PROCEDURE:   After the risks benefits and alternatives of the procedure were thoroughly explained, informed consent was obtained.  Digital rectal exam was performed and revealed no abnormalities and normal prostate.   The LB 180AL [id number] endoscope was introduced through the anus and advanced to the cecum, which was identified by both the appendix and ileocecal valve, without limitations.  The quality of the prep was excellent, using MoviPrep.  The instrument was then slowly withdrawn as the colon was fully examined. <<PROCEDUREIMAGES>>  FINDINGS:  A sessile polyp was found in the cecum. It was 2.5 - 3 cm in size. Soft and fleshy, on a fold. about 1/3 circumference. Multiple biopsies were obtained and sent to pathology.  There was a submucosal lesion at the appendiceal orifice. Bulging and somewhat firm. Multiple biopsies were obtained and sent to pathology.  A diminutive polyp was found in the cecum. The polyp was removed using cold biopsy forceps.  A pedunculated polyp was found at the splenic flexure. Polyp was snared, then cauterized with monopolar cautery. Retrieval was successful.  This was otherwise a normal examination of the colon.   Retroflexed views in the rectum revealed no abnormalities.    The time to cecum = 7:52 minutes. The scope was then withdrawn in 10:12 minutes from the cecum and the procedure  completed. COMPLICATIONS:  None ENDOSCOPIC IMPRESSION: 1) 2.5 - 3 cm sessile polyp in the cecum - biopsied only 2) Submucosal lesion at the appendiceal orifice - biopsied 3) Diminutive polyp in the cecum - removed 4) Pedunculated polyp at the splenic flexure - removed 5) Otherwise normal examination with excellent prep RECOMMENDATIONS: 1) Hold aspirin, aspirin products, and anti-inflammatory medication for 2 weeks. 2) Await pathology results 3) Will contact him with results and plans. It is probably possible to remove the large cecal polyp via multiple colonoscopies but need to know what appendiceal abnormality is before planning this out as surgery could be indicated. REPEAT EXAM:  In for Colonoscopy, pending biopsy results.  Iva Boop, MD, [facility]  CC:  Crawford Givens, MD and The Patient  n. eSIGNED:   Iva Boop at 11/21/2011 12:39 PM  Remi Haggard, [phone]

## 2011-11-21 NOTE — Progress Notes (Signed)
Patient did not experience any of the following events: a burn prior to discharge; a fall within the facility; wrong site/side/patient/procedure/implant event; or a hospital transfer or hospital admission upon discharge from the facility. (G8907) Patient did not have preoperative order for IV antibiotic SSI prophylaxis. (G8918)  

## 2011-11-22 ENCOUNTER — Telehealth: Payer: Self-pay | Admitting: *Deleted

## 2011-11-22 NOTE — Telephone Encounter (Signed)
No answer

## 2011-11-27 ENCOUNTER — Emergency Department (HOSPITAL_COMMUNITY): Payer: 59

## 2011-11-27 ENCOUNTER — Encounter (HOSPITAL_COMMUNITY): Payer: Self-pay | Admitting: Cardiology

## 2011-11-27 ENCOUNTER — Encounter: Payer: Self-pay | Admitting: Family Medicine

## 2011-11-27 ENCOUNTER — Inpatient Hospital Stay (HOSPITAL_COMMUNITY)
Admission: EM | Admit: 2011-11-27 | Discharge: 2011-11-28 | DRG: 313 | Disposition: A | Discharge status: Home / Self Care | Payer: 59 | Source: Ambulatory Visit | Attending: Cardiology | Admitting: Cardiology

## 2011-11-27 ENCOUNTER — Other Ambulatory Visit: Payer: Self-pay

## 2011-11-27 DIAGNOSIS — E669 Obesity, unspecified: Secondary | ICD-10-CM | POA: Diagnosis present

## 2011-11-27 DIAGNOSIS — K219 Gastro-esophageal reflux disease without esophagitis: Secondary | ICD-10-CM | POA: Diagnosis present

## 2011-11-27 DIAGNOSIS — E785 Hyperlipidemia, unspecified: Secondary | ICD-10-CM | POA: Diagnosis present

## 2011-11-27 DIAGNOSIS — I10 Essential (primary) hypertension: Secondary | ICD-10-CM | POA: Diagnosis present

## 2011-11-27 DIAGNOSIS — K649 Unspecified hemorrhoids: Secondary | ICD-10-CM | POA: Diagnosis present

## 2011-11-27 DIAGNOSIS — E876 Hypokalemia: Secondary | ICD-10-CM | POA: Diagnosis present

## 2011-11-27 DIAGNOSIS — R0789 Other chest pain: Principal | ICD-10-CM

## 2011-11-27 DIAGNOSIS — J309 Allergic rhinitis, unspecified: Secondary | ICD-10-CM | POA: Diagnosis present

## 2011-11-27 DIAGNOSIS — N529 Male erectile dysfunction, unspecified: Secondary | ICD-10-CM | POA: Diagnosis present

## 2011-11-27 LAB — COMPREHENSIVE METABOLIC PANEL
ALT: 12 U/L (ref 0–53)
ALT: 12 U/L (ref 0–53)
AST: 13 U/L (ref 0–37)
AST: 12 U/L (ref 0–37)
Albumin: 3.7 g/dL (ref 3.5–5.2)
Alkaline Phosphatase: 55 U/L (ref 39–117)
Alkaline Phosphatase: 52 U/L (ref 39–117)
BUN: 14 mg/dL (ref 6–23)
BUN: 12 mg/dL (ref 6–23)
Creatinine, Ser: 0.92 mg/dL (ref 0.50–1.35)
GFR calc Af Amer: >90 mL/min (ref >90)
GFR calc non Af Amer: >90 mL/min (ref >90)
Potassium: 3.2 mEq/L — ABNORMAL LOW (ref 3.5–5.1)
Sodium: 140 mEq/L (ref 135–145)
Total Protein: 7 g/dL (ref 6.0–8.3)

## 2011-11-27 LAB — CARDIAC PANEL(CRET KIN+CKTOT+MB+TROPI)
CK, MB: 1.8 ng/mL (ref 0.3–4.0)
Relative Index: 1.5 (ref 0.0–2.5)
Relative Index: 1.7 (ref 0.0–2.5)
Total CK: 135 U/L (ref 7–232)
Troponin I: <0.3 ng/mL (ref <0.30)
Troponin I: <0.3 ng/mL (ref <0.30)
Troponin I: <0.3 ng/mL (ref <0.30)

## 2011-11-27 LAB — CBC
MCHC: 32.4 g/dL (ref 30.0–36.0)
Platelets: 207 10*3/uL (ref 150–400)
RDW: 13.7 % (ref 11.5–15.5)

## 2011-11-27 LAB — HEPARIN LEVEL (UNFRACTIONATED): Heparin Unfractionated: 0.63 IU/mL (ref 0.30–0.70)

## 2011-11-27 LAB — PROTIME-INR
INR: 1.18 (ref 0.00–1.49)
Prothrombin Time: 15.3 seconds — ABNORMAL HIGH (ref 11.6–15.2)

## 2011-11-27 MED ORDER — POTASSIUM CHLORIDE CRYS ER 20 MEQ PO TBCR
EXTENDED_RELEASE_TABLET | ORAL | Status: AC
  Filled 2011-11-27: qty 2

## 2011-11-27 MED ORDER — PANTOPRAZOLE SODIUM 40 MG PO TBEC
40.0000 mg | DELAYED_RELEASE_TABLET | Freq: Every day | ORAL | Status: DC
  Administered 2011-11-27: 40 mg via ORAL
  Filled 2011-11-27 (×2): qty 1

## 2011-11-27 MED ORDER — POTASSIUM CHLORIDE CRYS ER 20 MEQ PO TBCR
40.0000 meq | EXTENDED_RELEASE_TABLET | Freq: Once | ORAL | Status: AC
  Administered 2011-11-27: 40 meq via ORAL

## 2011-11-27 MED ORDER — ONDANSETRON HCL 4 MG/2ML IJ SOLN
4.0000 mg | Freq: Once | INTRAMUSCULAR | Status: AC
  Administered 2011-11-27: 4 mg via INTRAVENOUS
  Filled 2011-11-27 (×2): qty 2

## 2011-11-27 MED ORDER — METOPROLOL TARTRATE 12.5 MG HALF TABLET
12.5000 mg | ORAL_TABLET | Freq: Two times a day (BID) | ORAL | Status: DC
  Administered 2011-11-27 – 2011-11-28 (×3): 12.5 mg via ORAL
  Filled 2011-11-27 (×4): qty 1

## 2011-11-27 MED ORDER — NITROGLYCERIN 0.4 MG SL SUBL: 0.4000 mg | SUBLINGUAL_TABLET | SUBLINGUAL | Status: DC | PRN

## 2011-11-27 MED ORDER — ONDANSETRON HCL 4 MG/2ML IJ SOLN: 4.0000 mg | Freq: Four times a day (QID) | INTRAMUSCULAR | Status: DC | PRN

## 2011-11-27 MED ORDER — PANTOPRAZOLE SODIUM 40 MG IV SOLR
40.0000 mg | Freq: Once | INTRAVENOUS | Status: AC
  Administered 2011-11-27: 40 mg via INTRAVENOUS
  Filled 2011-11-27 (×2): qty 40

## 2011-11-27 MED ORDER — LOSARTAN POTASSIUM 50 MG PO TABS
50.0000 mg | ORAL_TABLET | Freq: Every day | ORAL | Status: DC
  Administered 2011-11-27 – 2011-11-28 (×2): 50 mg via ORAL
  Filled 2011-11-27 (×2): qty 1

## 2011-11-27 MED ORDER — HEPARIN BOLUS VIA INFUSION
4000.0000 [IU] | Freq: Once | INTRAVENOUS | Status: AC
  Filled 2011-11-27: qty 4000

## 2011-11-27 MED ORDER — SODIUM CHLORIDE 0.9 % IV SOLN: INTRAVENOUS | Status: DC

## 2011-11-27 MED ORDER — HEPARIN SOD (PORCINE) IN D5W 100 UNIT/ML IV SOLN
1300.0000 [IU]/h | INTRAVENOUS | Status: DC
  Administered 2011-11-27 (×2): 1300 [IU]/h via INTRAVENOUS
  Filled 2011-11-27 (×3): qty 250

## 2011-11-27 MED ORDER — ASPIRIN 81 MG PO CHEW
324.0000 mg | CHEWABLE_TABLET | ORAL | Status: AC
  Administered 2011-11-27: 324 mg via ORAL

## 2011-11-27 MED ORDER — ACETAMINOPHEN 325 MG PO TABS: 650.0000 mg | ORAL_TABLET | ORAL | Status: DC | PRN

## 2011-11-27 MED ORDER — MORPHINE SULFATE 4 MG/ML IJ SOLN
4.0000 mg | Freq: Once | INTRAMUSCULAR | Status: AC
  Administered 2011-11-27: 4 mg via INTRAVENOUS
  Filled 2011-11-27: qty 1

## 2011-11-27 MED ORDER — REGADENOSON 0.4 MG/5ML IV SOLN
0.4000 mg | Freq: Once | INTRAVENOUS | Status: AC
  Administered 2011-11-28: 0.4 mg via INTRAVENOUS
  Filled 2011-11-27: qty 5

## 2011-11-27 MED ORDER — NITROGLYCERIN IN D5W 200-5 MCG/ML-% IV SOLN: 3.0000 ug/min | INTRAVENOUS | Status: DC

## 2011-11-27 MED ORDER — AMLODIPINE BESYLATE 10 MG PO TABS
10.0000 mg | ORAL_TABLET | Freq: Every day | ORAL | Status: DC
  Administered 2011-11-27 – 2011-11-28 (×2): 10 mg via ORAL
  Filled 2011-11-27 (×2): qty 1

## 2011-11-27 MED ORDER — HEPARIN SODIUM (PORCINE) 5000 UNIT/ML IJ SOLN
INTRAMUSCULAR | Status: AC
  Administered 2011-11-27: 4000 [IU]
  Filled 2011-11-27: qty 1

## 2011-11-27 MED ORDER — NITROGLYCERIN IN D5W 200-5 MCG/ML-% IV SOLN
2.0000 ug/min | Freq: Once | INTRAVENOUS | Status: AC
  Administered 2011-11-27: 5 ug/min via INTRAVENOUS
  Filled 2011-11-27: qty 250

## 2011-11-27 MED ORDER — SIMVASTATIN 20 MG PO TABS
20.0000 mg | ORAL_TABLET | Freq: Every day | ORAL | Status: DC
  Administered 2011-11-27 – 2011-11-28 (×2): 20 mg via ORAL
  Filled 2011-11-27 (×2): qty 1

## 2011-11-27 MED ORDER — ASPIRIN EC 81 MG PO TBEC
81.0000 mg | DELAYED_RELEASE_TABLET | Freq: Every day | ORAL | Status: DC
  Administered 2011-11-28: 81 mg via ORAL
  Filled 2011-11-27: qty 1

## 2011-11-27 NOTE — ED Notes (Signed)
Patient is resting comfortably. 

## 2011-11-27 NOTE — ED Provider Notes (Signed)
History     CSN: [id number] Arrival date & time: 11/27/2011 12:07 AM   First MD Initiated Contact with Patient 11/27/11 0013      No chief complaint on file.   (Consider location/radiation/quality/duration/timing/severity/associated sxs/prior treatment) The history is provided by the patient.  pt awoke w mid chest pain. Constant. Dull. Non radiating. No hx same pain. Was clammy. No diaphoresis. Mild nausea, improved. No vomiting. ems gave asa, ntg, zofran-mild improvement in pain. No back, neck or jaw pain. Not pleuritic. No sob. No cough or uri c/o. Hx gerd, but pain different. No leg pain or swelling. No dvt or pe hx. No personal or fam hx cad. Non smoker, no drug use. No other recent cp, even w exertion/activity. Felt normal/fine when went to bed tonight.   Past Medical History  Diagnosis Date  . Hyperlipidemia   . Hypertension   . 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 GERD (gastroesophageal reflux disease)     Past Surgical History  Procedure Date  . Colonoscopy 11/21/11    Family History  Problem Relation Age of Onset  . Heart disease Mother 29    MI, died at age 51  . Hypertension Mother   . Alcohol abuse Father     cirrhoisis of liver-- ETOH abuse  . Colon cancer Neg Hx   . Prostate cancer Neg Hx     History  Substance Use Topics  . Smoking status: Never Smoker   . Smokeless tobacco: Never Used  . Alcohol Use: No      Review of Systems  Constitutional: Negative for fever.  HENT: Negative for neck pain.   Eyes: Negative for redness.  Respiratory: Negative for cough and shortness of breath.   Cardiovascular: Positive for chest pain. Negative for palpitations and leg swelling.  Gastrointestinal: Negative for abdominal pain.  Genitourinary: Negative for flank pain.  Musculoskeletal: Negative for back pain.  Skin: Negative for rash.  Neurological: Negative for headaches.    Hematological: Does not bruise/bleed easily.  Psychiatric/Behavioral: Negative for confusion.    Allergies  Lisinopril  Home Medications   Current Outpatient Rx  Name Route Sig Dispense Refill  . AMLODIPINE BESYLATE 10 MG PO TABS Oral Take 1 tablet (10 mg total) by mouth daily. 1 daily for bp 30 tablet 5  . DESLORATADINE 5 MG PO TABS Oral Take 5 mg by mouth daily as needed. For allergies    . LOSARTAN POTASSIUM 50 MG PO TABS Oral Take 50 mg by mouth daily.      Marland Kitchen ONE-DAILY MULTI VITAMINS PO TABS Oral Take 1 tablet by mouth daily.      Marland Kitchen SIMVASTATIN 20 MG PO TABS Oral Take 1 tablet (20 mg total) by mouth daily. For cholesterol 30 tablet 4    BP 135/66  Resp 20  SpO2 98%  Physical Exam  Nursing note and vitals reviewed. Constitutional: He is oriented to person, place, and time. He appears well-developed and well-nourished. No distress.  HENT:  Head: Atraumatic.  Eyes: Pupils are equal, round, and reactive to light.  Neck: Neck supple. No tracheal deviation present.  Cardiovascular: Normal rate, regular rhythm, normal heart sounds and intact distal pulses.  Exam reveals no gallop and no friction rub.   No murmur heard. Pulmonary/Chest: Effort normal and breath sounds normal. No accessory muscle usage. No respiratory distress. He exhibits no tenderness.  Abdominal: Soft. Bowel sounds are normal. He  exhibits no distension and no mass. There is no tenderness. There is no rebound and no guarding.  Musculoskeletal: Normal range of motion. He exhibits no edema and no tenderness.  Neurological: He is alert and oriented to person, place, and time.  Skin: Skin is warm and dry.    ED Course  Procedures (including critical care time)     MDM  Iv ns. O2, monitor. Labs. Ecg. Pt has had asa. ntg gtt and morphine iv. Pcxr.    Date: 11/27/2011  Rate: 90  Rhythm: normal sinus rhythm  QRS Axis: normal  Intervals: normal  ST/T Wave abnormalities: nonspecific ST changes  Conduction  Disutrbances:none  Narrative Interpretation:   Old EKG Reviewed: none available St dep inf/lat         Suzi Roots, MD 12/11/11 [phone]

## 2011-11-27 NOTE — ED Notes (Signed)
New onset chest pain that woke him; diaphoretic; zofran given by EMS for nausea. Pain is substernal and pain not letting up. 10/10.

## 2011-11-27 NOTE — ED Notes (Signed)
Patient is eating his lunch tray.  Patient and family stated that they didn't need anything at this time.

## 2011-11-27 NOTE — H&P (Addendum)
Admit date: 11/27/2011 Referring Physician Dr. Cathren Laine Primary MD  Dr. Clelia Croft Chief complaint/reason for admission:chest pain  HPI: This is a 50yo AA male with history of HTN, GERD and dyslipidemia who presented to the ER with complaints of chest pain.  He was in his usual state of health until around 11pm and awakened feeling funny and then developed substernal 8/10 chest pain located over left breast with no radiation.  He denied any SOB or nausea but did get diaphoretic.  In the ER he was given SL NTG with improvement in the pain but then the pain recurred.  He currently complains of a 2/10 pain.    PMH:    Past Medical History  Diagnosis Date  . Hyperlipidemia   . Hypertension   . 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 GERD (gastroesophageal reflux disease)     PSH:    Past Surgical History  Procedure Date  . Colonoscopy 11/21/11    ALLERGIES:   Lisinopril  Prior to Admit Meds:   (Not in a hospital admission) Family HX:    Family History  Problem Relation Age of Onset  . Heart disease Mother 70    MI, died at age 1  . Hypertension Mother   . Alcohol abuse Father     cirrhoisis of liver-- ETOH abuse  . Colon cancer Neg Hx   . Prostate cancer Neg Hx    Social HX:    History   Social History  . Marital Status: Divorced    Spouse Name: N/A    Number of Children: N/A  . Years of Education: N/A   Occupational History  . Not on file.   Social History Main Topics  . Smoking status: Never Smoker   . Smokeless tobacco: Never Used  . Alcohol Use: No  . Drug Use: No  . Sexually Active: Not on file   Other Topics Concern  . Not on file   Social History Narrative   [place] [employer] [place] son/1 daughter     ROS:  All 11 ROS were addressed and are negative except what is stated in the HPI  PHYSICAL EXAM Filed Vitals:   11/27/11 0200  BP:  126/73  Pulse: 73  Resp: 18   General: Well developed, well nourished, in no acute distress Head: Eyes PERRLA, No xanthomas.   Normal cephalic and atramatic  Lungs:   Clear bilaterally to auscultation and percussion. Heart:   HRRR S1 S2 Pulses are 2+ & equal.            No carotid bruit. No JVD.  No abdominal bruits. No femoral bruits. Abdomen: Bowel sounds are positive, abdomen soft and non-tender without masses Extremities:   No clubbing, cyanosis or edema.  DP +1 Neuro: Alert and oriented X 3. Psych:  Good affect, responds appropriately   Labs:   Lab Results  Component Value Date   WBC 6.4 11/27/2011   HGB 12.5* 11/27/2011   HCT 38.6* 11/27/2011   MCV 80.8 11/27/2011   PLT 207 11/27/2011    Lab 11/27/11 0028  NA 140  K 3.2*  CL 105  CO2 24  BUN 14  CREATININE 0.93  CALCIUM 8.7  PROT 7.0  BILITOT 0.3  ALKPHOS 55  ALT 12  AST 13  GLUCOSE 110*   Lab Results  Component Value Date   CKTOTAL 135 11/27/2011  CKMB 2.0 11/27/2011   TROPONINI <0.30 11/27/2011       Lab Results  Component Value Date   CHOL 159 10/11/2011   CHOL 205* 01/30/2011   CHOL 214* 11/12/2010   Lab Results  Component Value Date   HDL 44.70 10/11/2011   HDL 42.20 01/30/2011   HDL 16.10 11/12/2010   Lab Results  Component Value Date   LDLCALC 96 10/11/2011   LDLCALC 86 10/31/2009   LDLCALC 105* 04/27/2009   Lab Results  Component Value Date   TRIG 94.0 10/11/2011   TRIG 74.0 01/30/2011   TRIG 87.0 11/12/2010   Lab Results  Component Value Date   CHOLHDL 4 10/11/2011   CHOLHDL 5 01/30/2011   CHOLHDL 5 11/12/2010   Lab Results  Component Value Date   LDLDIRECT 148.9 06/14/2011   LDLDIRECT 150.5 01/30/2011   LDLDIRECT 153.3 11/12/2010      Radiology: *RADIOLOGY REPORT*  Clinical Data: New onset chest pain and diaphoresis.  PORTABLE CHEST - 1 VIEW  Comparison: None.  Findings: Shallow inspiration. The heart size and pulmonary  vascularity are normal. The lungs appear clear  and expanded without  focal air space disease or consolidation. No blunting of the  costophrenic angles. No pneumothorax.  IMPRESSION:  No evidence of active pulmonary disease.  Original Report Authenticated By: Marlon Pel, M.D.   EKG:  NSR with nonspecific ST abnormality in the inferolateral leads  ASSESSMENT:  1.  USAP currently with 2/10 pain.  Cardiac enzymes negative x1.  EKG with nonspecific ST abnormality. 2.  HTN controlled 3.  Dyslipidemia 4.  GERD 5.  Hypokalemia  PLAN:   1.  Admit to tele bed 2.  Cycle cardiac enzymes 3.  IV Heparin and NTG gtts 4.  ASA 5.  Lopressor 12.5mg  BID 6.  NPO 7.  Further w/u with cath vs. Nuclear stress test per [facility] cardiology 8.  Replete potassium 9.  Check BMET in am  Quintella Reichert, MD  11/27/2011  2:46 AM

## 2011-11-27 NOTE — Progress Notes (Signed)
ANTICOAGULATION CONSULT NOTE - Follow Up Consult  Pharmacy Consult for Heparin Indication: chest pain/ACS  Allergies  Allergen Reactions  . Lisinopril     REACTION: coughing    Patient Measurements: Height: 6' (182.9 cm) Weight: 248 lb 3.8 oz (112.6 kg) IBW/kg (Calculated) : 77.6  Adjusted Body Weight:   Vital Signs: Temp: 98.5 F (36.9 C) (12/12 1700) Temp src: Oral (12/12 1700) BP: 128/85 mmHg (12/12 1800) Pulse Rate: 69  (12/12 1800)  Labs:  Basename 11/27/11 1746 11/27/11 1124 11/27/11 0740 11/27/11 0028 11/27/11 0026  HGB -- -- -- 12.5* --  HCT -- -- -- 38.6* --  PLT -- -- -- 207 --  APTT -- -- -- -- --  LABPROT -- -- 15.3* -- --  INR -- -- 1.18 -- --  HEPARINUNFRC 0.42 0.63 -- -- --  CREATININE -- -- 0.92 0.93 --  CKTOTAL -- 112 114 -- 135  CKMB -- 1.9 2.0 -- 2.0  TROPONINI -- <0.30 <0.30 -- <0.30   Estimated Creatinine Clearance: 124.5 ml/min (by C-G formula based on Cr of 0.92).   Medications:  Heparin 1300 units/hr  Assessment: 50yom on heparin for ACS/CP. Heparin level (0.42) continues to be therapeutic with rate of 1300 units/hr. - No significant bleeding reported  Goal of Therapy:  Heparin level 0.3-0.7 units/ml   Plan:  1. Continue heparin drip 1300 units/hr (13 ml/hr) 2. Follow-up AM heparin level and Cardiology recommendatoins  Cleon Dew [phone] 11/27/2011,6:34 PM

## 2011-11-27 NOTE — ED Notes (Signed)
4 zofran; 3 nitro SL; and 6 morphine; aspirin given by EMS. Pain relieved some with meds.

## 2011-11-27 NOTE — ED Notes (Addendum)
Patient is resting comfortably.wife at bedside

## 2011-11-27 NOTE — Progress Notes (Signed)
Patient seen and examined. History reviewed. Patient presents with chest pain in the left breast area. It is described as a sharp pain without radiation. His initial enzymes are negative. His electrocardiogram shows sinus rhythm with nonspecific T-wave changes. No risk factors for pulmonary embolus. His chest pain is reproduced with palpation. I think it may be musculoskeletal. Plan continue to cycle enzymes. If negative proceed with Myoview. Olga Millers

## 2011-11-27 NOTE — Progress Notes (Signed)
ANTICOAGULATION CONSULT NOTE - Follow Up Consult  Pharmacy Consult for heparin Indication: chest pain/ACS  Allergies  Allergen Reactions  . Lisinopril     REACTION: coughing    Patient Measurements:   Adjusted Body Weight:   Vital Signs: BP: 140/84 mmHg (12/12 1236) Pulse Rate: 64  (12/12 1238)  Labs:  Basename 11/27/11 1124 11/27/11 0740 11/27/11 0028 11/27/11 0026  HGB -- -- 12.5* --  HCT -- -- 38.6* --  PLT -- -- 207 --  APTT -- -- -- --  LABPROT -- 15.3* -- --  INR -- 1.18 -- --  HEPARINUNFRC 0.63 -- -- --  CREATININE -- 0.92 0.93 --  CKTOTAL 112 114 -- 135  CKMB 1.9 2.0 -- 2.0  TROPONINI <0.30 <0.30 -- <0.30   The CrCl is unknown because both a height and weight (above a minimum accepted value) are required for this calculation.   Medications:  Scheduled:    . amLODipine  10 mg Oral Daily  . aspirin EC  81 mg Oral Daily  . heparin      . heparin  4,000 Units Intravenous Once  . losartan  50 mg Oral Daily  . metoprolol tartrate  12.5 mg Oral BID  .  morphine injection  4 mg Intravenous Once  . nitroGLYCERIN  2-200 mcg/min Intravenous Once  . ondansetron (ZOFRAN) IV  4 mg Intravenous Once  . pantoprazole  40 mg Oral Q1200  . pantoprazole (PROTONIX) IV  40 mg Intravenous Once  . potassium chloride  40 mEq Oral Once  . regadenoson  0.4 mg Intravenous Once   Infusions:    . sodium chloride    . heparin 1,300 Units/hr (11/27/11 0518)  . nitroGLYCERIN      Assessment: 50 yo male with chest pain is currently on therapeutic heparin.  Goal of Therapy:  Heparin level 0.3-0.7 units/ml   Plan:  1) Continue heparin at 1300 units/hr (=13 ml/hr). 2) Check a heparin level at 1800 to reconfirm.  Kasyn Rolph, Tsz-Yin 11/27/2011,1:00 PM

## 2011-11-27 NOTE — Progress Notes (Signed)
ANTICOAGULATION CONSULT NOTE - Initial Consult  Pharmacy Consult for heparin Indication: chest pain/ACS  Allergies  Allergen Reactions  . Lisinopril     REACTION: coughing    Vital Signs: BP: 127/80 mmHg (12/12 0415) Pulse Rate: 67  (12/12 0415)  Labs:  Basename 11/27/11 0028 11/27/11 0026  HGB 12.5* --  HCT 38.6* --  PLT 207 --  APTT -- --  LABPROT -- --  INR -- --  HEPARINUNFRC -- --  CREATININE 0.93 --  CKTOTAL -- 135  CKMB -- 2.0  TROPONINI -- <0.30   The CrCl is unknown because both a height and weight (above a minimum accepted value) are required for this calculation.  Medical History: Past Medical History  Diagnosis Date  . Hyperlipidemia   . Hypertension   . 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 GERD (gastroesophageal reflux disease)     Assessment: 50 yom presented to the ED with chest pain to start empiric IV heparin. Plan for cath or nuclear study.   Goal of Therapy:  Heparin level 0.3-0.7 units/ml   Plan:  Heparin bolus 4000units IV x 1 Heparin gtt 1300units/hr Check 6 hour heparin level Daily heparin level and CBC  Tai Skelly, Drake Leach 11/27/2011,4:48 AM

## 2011-11-28 ENCOUNTER — Inpatient Hospital Stay (HOSPITAL_COMMUNITY): Payer: 59

## 2011-11-28 DIAGNOSIS — R0789 Other chest pain: Secondary | ICD-10-CM

## 2011-11-28 DIAGNOSIS — R079 Chest pain, unspecified: Secondary | ICD-10-CM

## 2011-11-28 LAB — CBC
MCH: 26.5 pg (ref 26.0–34.0)
MCV: 82.7 fL (ref 78.0–100.0)
Platelets: 221 10*3/uL (ref 150–400)
RBC: 4.86 MIL/uL (ref 4.22–5.81)
RDW: 14.2 % (ref 11.5–15.5)
WBC: 7.6 10*3/uL (ref 4.0–10.5)

## 2011-11-28 LAB — HEPARIN LEVEL (UNFRACTIONATED): Heparin Unfractionated: 0.76 IU/mL — ABNORMAL HIGH (ref 0.30–0.70)

## 2011-11-28 MED ORDER — PRAVASTATIN SODIUM 40 MG PO TABS: 40.0000 mg | ORAL_TABLET | Freq: Every day | ORAL | Status: DC

## 2011-11-28 MED ORDER — TECHNETIUM TC 99M TETROFOSMIN IV KIT
30.0000 | PACK | Freq: Once | INTRAVENOUS | Status: AC | PRN
  Administered 2011-11-28: 30 via INTRAVENOUS

## 2011-11-28 MED ORDER — ASPIRIN 81 MG PO TBEC: 81.0000 mg | DELAYED_RELEASE_TABLET | Freq: Every day | ORAL | Status: DC

## 2011-11-28 MED ORDER — LOSARTAN POTASSIUM 50 MG PO TABS: 75.0000 mg | ORAL_TABLET | Freq: Every day | ORAL | Status: DC

## 2011-11-28 MED ORDER — TECHNETIUM TC 99M TETROFOSMIN IV KIT
10.0000 | PACK | Freq: Once | INTRAVENOUS | Status: AC | PRN
  Administered 2011-11-28: 10 via INTRAVENOUS

## 2011-11-28 NOTE — Discharge Summary (Signed)
CARDIOLOGY DISCHARGE SUMMARY   Patient ID: Daniel [name] MRN: [id number] DOB/AGE: 1961-05-15 50 y.o.  Admit date: 11/27/2011 Discharge date: 11/28/2011  Primary Discharge Diagnosis: Chest pain Secondary Discharge Diagnosis:  Patient Active Problem List  Diagnoses  . HYPERLIPIDEMIA  . OBESITY  . ERECTILE DYSFUNCTION  . HYPERTENSION  . HEMORRHOIDS  . ALLERGIC RHINITIS  . GERD (gastroesophageal reflux disease)  . Prostate cancer screening  . Routine general medical examination at a health care facility  . Colon cancer screening  . Chest pain    Significant Diagnostic Studies: Myoview, CXR  Hospital Course: Holzer is a 50 year old male with no previous history of coronary artery disease. He came to the emergency room where he was admitted for further evaluation and treatment.  His cardiac enzymes were negative for MI. His blood pressure was running in the 140's/80s at home so his Cozaar was increased. His chest pain was reproducible with palpation. It resolved. A Lexi scan Myoview was performed to evaluate for ischemia. The scan showed no scar or ischemia and an EF of 41%. Because of his decreased EF, he needs an echocardiogram and followup as an outpatient. However, since his chest pain has resolved and he is otherwise stable, he can be safely discharged home in improved condition, to followup as an outpatient.  Labs: Lab Results  Component Value Date   WBC 7.6 11/28/2011   HGB 12.9* 11/28/2011   HCT 40.2 11/28/2011   MCV 82.7 11/28/2011   PLT 221 11/28/2011    Lab 11/27/11 0740  NA 140  K 4.0  CL 105  CO2 29  BUN 12  CREATININE 0.92  CALCIUM 8.5  PROT 7.0  BILITOT 0.4  ALKPHOS 52  ALT 12  AST 12  GLUCOSE 100*   Lab Results  Component Value Date   CKTOTAL 104 11/27/2011   CKMB 1.8 11/27/2011   TROPONINI <0.30 11/27/2011      CK, MB  2.0   2.0 1.9 1.8      Total CK  135   114 112 104      Troponin I  <0.30    <0.30  <0.30  <0.30         Radiology: PORTABLE CHEST - 1 VIEW Comparison: None. Findings: Shallow inspiration. The heart size and pulmonary vascularity are normal. The lungs appear clear and expanded without focal air space disease or consolidation. No blunting of the costophrenic angles. No pneumothorax. IMPRESSION: No evidence of active pulmonary disease.  EKG: 12/13 Normal sinus rhythm with sinus arrhythmia Normal ECG  ECG: 12/12 - Read as atrial flutter/fibrillation but upon review,  it is sinus rhythm. The patient has not had Atrial fib/flutter  Myoview: IMPRESSION: No evidence of ischemia or infarct. Ejection fraction 41%.  FOLLOW UP PLANS AND APPOINTMENTS Discharge Orders    Future Orders Please Complete By Expires   Diet - low sodium heart healthy      Increase activity slowly        Current Discharge Medication List    START taking these medications   Details  pravastatin (PRAVACHOL) 40 MG tablet Take 1 tablet (40 mg total) by mouth daily. Qty: 30 tablet, Refills: 11      CONTINUE these medications which have NOT CHANGED   Details  amLODipine (NORVASC) 10 MG tablet Take 1 tablet (10 mg total) by mouth daily. 1 daily for bp Qty: 30 tablet, Refills: 5    desloratadine (CLARINEX) 5 MG tablet Take 5 mg by mouth daily as needed. For  allergies         Multiple Vitamin (MULTIVITAMIN) tablet Take 1 tablet by mouth daily.      omeprazole (PRILOSEC) 20 MG capsule Take 20 mg by mouth daily.        STOP taking these medications     simvastatin (ZOCOR) 20 MG tablet         CONTINUE these medications which have CHANGED     losartan (COZAAR) 50 MG tablet Take 1-1/2 tabs, 75 mg, by mouth daily.         Follow-up Information    Follow up with Crawford Givens, MD in 2 weeks. (As needed )       Follow up with Olga Millers, MD or Tereso Newcomer, PA-C. The office will call    Contact information:   [address] [place], [address] [place]  [address] [phone]          BRING ALL MEDICATIONS WITH YOU TO FOLLOW UP APPOINTMENTS  Time spent with patient to include physician time: Signed: Theodore Demark 11/28/2011, 6:13 PM Co-Sign MD

## 2011-11-28 NOTE — Progress Notes (Signed)
Quick Note:  Office   Call patient - polyps are all benign but not sure what abnormality of appendix is He needs a CT abd/pelvis with IV contrast to evaluate abnormal appendix as seen at colonoscopy (793.4)  Will call with results/plans after that  [facility] - no letter, recall timing not yet determined ______

## 2011-11-28 NOTE — Progress Notes (Signed)
Lexiscan Myoview Pre-test - no CP/SOB With Lexiscan, pt felt flushed and hot. No CP/SOB.  ECG showed increased HR but no acute ischemic changes. Sx began to ease off and resolved in about 4 min. Images pending.

## 2011-11-28 NOTE — Progress Notes (Signed)
@   Subjective:  Denies CP or dyspnea   Objective:  Filed Vitals:   11/28/11 0300 11/28/11 0400 11/28/11 0500 11/28/11 0600  BP: 99/55 100/58  130/87  Pulse: 53 63  62  Temp:  97.7 F (36.5 C)    TempSrc:  Oral    Resp: 13 13  17   Height:      Weight:   247 lb 9.2 oz (112.3 kg)   SpO2: 98% 97%  99%    Intake/Output from previous day:  Intake/Output Summary (Last 24 hours) at 11/28/11 1191 Last data filed at 11/28/11 0600  Gross per 24 hour  Intake 958.73 ml  Output   2525 ml  Net -1566.27 ml    Physical Exam: Physical exam: Well-developed well-nourished in no acute distress.  Skin is warm and dry.  HEENT is normal.  Neck is supple. No thyromegaly.  Chest is clear to auscultation with normal expansion.  Cardiovascular exam is regular rate and rhythm.  Abdominal exam nontender or distended. No masses palpated. Extremities show no edema. neuro grossly intact    Lab Results: Basic Metabolic Panel:  Basename 11/27/11 0740 11/27/11 0028  NA 140 140  K 4.0 3.2*  CL 105 105  CO2 29 24  GLUCOSE 100* 110*  BUN 12 14  CREATININE 0.92 0.93  CALCIUM 8.5 8.7  MG 1.9 --  PHOS -- --   CBC:  Basename 11/28/11 0505 11/27/11 0028  WBC 7.6 6.4  NEUTROABS -- --  HGB 12.9* 12.5*  HCT 40.2 38.6*  MCV 82.7 80.8  PLT 221 207   Cardiac Enzymes:  Basename 11/27/11 1746 11/27/11 1124 11/27/11 0740  CKTOTAL 104 112 114  CKMB 1.8 1.9 2.0  CKMBINDEX -- -- --  TROPONINI <0.30 <0.30 <0.30     Assessment/Plan:  1) Chest pain - symptoms atypical; reproduced with palpation yesterday and now resolved; enzymes negative. Plan myoview today; if neg, dc home and fu with his primary care. 2) Hypertension - BP controlled; continue present meds. 3) Hyperlipidemia - given use of norvasc, dc zocor at dc and treat with pravachol 40 mg daily; lipids and liver in six weeks. >30 min PA and physician time D2 Olga Millers 11/28/2011, 7:28 AM

## 2011-11-28 NOTE — Progress Notes (Signed)
Pt educated r/t and given dc instructions.  Pt verbalized understanding of f/u visit with MD, medication purpose and use. Family at bedside.  Pt d/ced via wheelchair.

## 2011-12-02 ENCOUNTER — Other Ambulatory Visit: Payer: Self-pay

## 2011-12-02 DIAGNOSIS — R933 Abnormal findings on diagnostic imaging of other parts of digestive tract: Secondary | ICD-10-CM

## 2011-12-05 ENCOUNTER — Telehealth: Payer: Self-pay | Admitting: Family Medicine

## 2011-12-05 ENCOUNTER — Other Ambulatory Visit: Payer: Self-pay

## 2011-12-05 ENCOUNTER — Encounter (HOSPITAL_COMMUNITY): Payer: Self-pay | Admitting: *Deleted

## 2011-12-05 ENCOUNTER — Emergency Department (HOSPITAL_COMMUNITY)
Admission: EM | Admit: 2011-12-05 | Discharge: 2011-12-05 | Disposition: A | Discharge status: Home / Self Care | Payer: 59 | Attending: Emergency Medicine | Admitting: Emergency Medicine

## 2011-12-05 DIAGNOSIS — R55 Syncope and collapse: Secondary | ICD-10-CM

## 2011-12-05 LAB — CBC
MCHC: 32.9 g/dL (ref 30.0–36.0)
MCV: 82.3 fL (ref 78.0–100.0)
Platelets: 223 10*3/uL (ref 150–400)
RDW: 13.9 % (ref 11.5–15.5)
WBC: 5.6 10*3/uL (ref 4.0–10.5)

## 2011-12-05 LAB — POCT I-STAT TROPONIN I: Troponin i, poc: 0.01 ng/mL (ref 0.00–0.08)

## 2011-12-05 LAB — DIFFERENTIAL
Basophils Absolute: 0 10*3/uL (ref 0.0–0.1)
Basophils Relative: 1 % (ref 0–1)
Eosinophils Relative: 2 % (ref 0–5)
Lymphocytes Relative: 36 % (ref 12–46)
Monocytes Absolute: 0.4 10*3/uL (ref 0.1–1.0)
Neutro Abs: 3 10*3/uL (ref 1.7–7.7)

## 2011-12-05 LAB — BASIC METABOLIC PANEL
CO2: 29 mEq/L (ref 19–32)
Calcium: 9.3 mg/dL (ref 8.4–10.5)
Creatinine, Ser: 1.02 mg/dL (ref 0.50–1.35)
GFR calc Af Amer: >90 mL/min (ref >90)
Sodium: 138 mEq/L (ref 135–145)

## 2011-12-05 MED ORDER — SODIUM CHLORIDE 0.9 % IV BOLUS (SEPSIS)
1000.0000 mL | Freq: Once | INTRAVENOUS | Status: AC
  Administered 2011-12-05: 1000 mL via INTRAVENOUS

## 2011-12-05 NOTE — ED Notes (Signed)
Patient had syncopal episode.

## 2011-12-05 NOTE — ED Provider Notes (Signed)
Patient presents to the emergency room after a syncopal episode.  Patient was brought in on a spine board. Patient denies any neck pain or back pain. Physical Exam  BP 141/91  Pulse 65  Temp 97.8 F (36.6 C)  Resp 18  SpO2 100%  Physical Exam Alert, no distress ED Course  Procedures  MDM Her neck cyst criteria, patient was removed from the spine board and the cervical collar was removed.  Medical screening exam initiated      Celene Kras, MD 12/05/11 0700

## 2011-12-05 NOTE — Telephone Encounter (Signed)
Went to hospital this morning.  Had episode of being dizzy, passed out and the hospital ran tests and examined patient and told him it was probably due to a medication that he received at dentist office yesterday.  The hospital told him to make doctor aware in case they needed records/additional info.  Call back is [phone].

## 2011-12-05 NOTE — ED Notes (Signed)
Patient states he started taking oxycodone for toothache yest after taking first dose felt a little light headed, took another dose before going to bed last pm, woke up this am to go to the bathroom and had a syncopal episode doesn't remember falling just remembers waking up on the floor. Denies neck or back pain. Currently alert and oriented. Family at bedside

## 2011-12-05 NOTE — ED Notes (Signed)
Patient removed from long spine board by Dr Roselyn Bering.

## 2011-12-06 ENCOUNTER — Encounter: Payer: Self-pay | Admitting: Family Medicine

## 2011-12-06 DIAGNOSIS — R55 Syncope and collapse: Secondary | ICD-10-CM | POA: Insufficient documentation

## 2011-12-06 NOTE — Telephone Encounter (Signed)
Spoke with patient and the dentist gave him Oxycodone for pain and per the hospital that interacted with his BP meds and the pt passed out. Patient states he's fine now and doesn't need a f/u.

## 2011-12-06 NOTE — Telephone Encounter (Signed)
Per pt he had stress test on 11/28/11. He couldn't remember the cardiologist name but its a male and his next appt is the 1st of Jan.

## 2011-12-06 NOTE — Telephone Encounter (Signed)
Please call and get info on what med pt got at dentist and let me know.  See what his plan is in terms of f/u here the clinic, ie help him get an appointment for next week or two, OV.  Thanks.

## 2011-12-06 NOTE — Telephone Encounter (Signed)
Noted  

## 2011-12-06 NOTE — Telephone Encounter (Signed)
Added to intolerant list.  He was supposed to have f/u with cards re: prev admission for chest pain.  Please make sure that is happening.

## 2011-12-13 ENCOUNTER — Ambulatory Visit (INDEPENDENT_AMBULATORY_CARE_PROVIDER_SITE_OTHER)
Admission: RE | Admit: 2011-12-13 | Discharge: 2011-12-13 | Disposition: A | Discharge status: Home / Self Care | Payer: 59 | Source: Ambulatory Visit | Attending: Internal Medicine | Admitting: Internal Medicine

## 2011-12-13 DIAGNOSIS — R933 Abnormal findings on diagnostic imaging of other parts of digestive tract: Secondary | ICD-10-CM

## 2011-12-13 MED ORDER — IOHEXOL 300 MG/ML  SOLN
80.0000 mL | Freq: Once | INTRAMUSCULAR | Status: AC | PRN
  Administered 2011-12-13: 80 mL via INTRAVENOUS

## 2011-12-16 ENCOUNTER — Telehealth: Payer: Self-pay | Admitting: *Deleted

## 2011-12-16 ENCOUNTER — Encounter: Payer: Self-pay | Admitting: Internal Medicine

## 2011-12-16 NOTE — Progress Notes (Signed)
Quick Note:  1) Call patient and let him know the CT shows a mass in the appendix and some thickening of the intestine before it - probably some sort of tumor but still cannot tell what. 2) He needs a surgery referral - please ask if he has a general surgeon already and let me know the answer - then we can schedule appointment 3) Needs colonoscopy recall for 11/2012 entered 4) I can call him to discuss later today or later in week - when is good time and number? ______

## 2011-12-16 NOTE — Telephone Encounter (Signed)
Message copied by Daphine Deutscher on Mon Dec 16, 2011  9:32 AM ------      Message from: Stan Head E      Created: Mon Dec 16, 2011  8:20 AM       1) Call patient and let him know the CT shows a mass in the appendix and some thickening of the intestine before it - probably some sort of tumor but still cannot tell what.      2) He needs a surgery referral - please ask if he has a general surgeon already and let me know the answer - then we can schedule appointment      3) Needs colonoscopy recall for 11/2012 entered      4) I can call him to discuss later today or later in week - when is good time and number?

## 2011-12-16 NOTE — Telephone Encounter (Signed)
Recall in EPIC. Spoke with patient and he does not have a Careers adviser. He can be reached anytime at his cell number- [phone].

## 2011-12-17 HISTORY — PX: LAPAROSCOPIC RIGHT COLON RESECTION: SHX1935

## 2011-12-17 HISTORY — PX: APPENDECTOMY: SHX54

## 2011-12-18 ENCOUNTER — Other Ambulatory Visit: Payer: Self-pay

## 2011-12-18 DIAGNOSIS — R7401 Elevation of levels of liver transaminase levels: Secondary | ICD-10-CM

## 2011-12-18 DIAGNOSIS — R933 Abnormal findings on diagnostic imaging of other parts of digestive tract: Secondary | ICD-10-CM

## 2011-12-18 NOTE — Telephone Encounter (Signed)
I reviewed with him also - he needs a surgery appointment with a surgeon able to do lap-assisted colon/small bowel resections. Please arrange appointment

## 2011-12-18 NOTE — Telephone Encounter (Signed)
Patient aware of appt details for Dr Derrell Lolling with [facility] 01/06/12 8:45, he is asked to arrive by 8:15.  I have provided him with the appt date, time, location.

## 2012-01-06 ENCOUNTER — Encounter (INDEPENDENT_AMBULATORY_CARE_PROVIDER_SITE_OTHER): Payer: Self-pay | Admitting: General Surgery

## 2012-01-06 ENCOUNTER — Ambulatory Visit (INDEPENDENT_AMBULATORY_CARE_PROVIDER_SITE_OTHER): Payer: 59 | Admitting: General Surgery

## 2012-01-06 ENCOUNTER — Encounter (INDEPENDENT_AMBULATORY_CARE_PROVIDER_SITE_OTHER): Payer: Self-pay

## 2012-01-06 VITALS — BP 178/100 | HR 72 | Temp 98.2°F | Resp 16 | Ht 73.0 in | Wt 250.6 lb

## 2012-01-06 DIAGNOSIS — K6389 Other specified diseases of intestine: Secondary | ICD-10-CM

## 2012-01-06 NOTE — Patient Instructions (Signed)
There is a solid mass in your right colon near the appendix. I do not know whether this is cancerous or not. You will be scheduled for a right colon resection. We will do some of this operation laparoscopically, if possible.  Open Colon Resection Colon resection is surgery to take out part or all of the large intestine (colon). It is also called a colectomy.  LET YOUR CAREGIVER KNOW ABOUT:  Any allergies.   All medicines you are taking, including:   Herbs, eyedrops, over-the-counter medicines and creams.   Blood thinners (anticoagulants), aspirin, or other drugs that could affect blood clotting.   Use of steroids (by mouth or as creams).   Previous problems with anesthetics, including local anesthetics.   Possibility of pregnancy, if this applies.   Any history of blood clots.   Any history of bleeding or other blood problems.   Previous surgery.   Smoking history.   Any recent symptoms of colds or infections.   Other health problems.  RISKS AND COMPLICATIONS  There are always risks for surgery with medicine that makes you sleep (general anesthetic). They include breathing and heart problems. However, this risk is low for people who have no other health problems. Other complications from colon resection may include:  An infection developing in the area where the surgery was done.   Problems with the incisions including:   Bleeding from an incision.   The wound reopening.   Tissues from inside the abdomen bulging through the incision (hernia).   Bleeding inside the abdomen.   Reopening of the colon where it was stitched or stapled together. This is a serious complication. Another procedure may be needed to fix the problem.   Damage to other organs in the abdomen.   A blood clot forming in a vein and traveling to the lungs.   Future blockage of the colon.  BEFORE THE PROCEDURE  A medical evaluation will be done. This may include:   A physical exam.   Blood  tests.   A test to check the heart's rhythm (electrocardiogram).   X-rays, such as magnetic resonance imaging (MRI). This can take pictures of the colon. An MRI uses a magnet, radio waves, and a computer to create a picture of your colon.   Talking with the person who will be in charge of the medicine during the procedure. An open colon resection requires general anesthesia. Ask what you can expect.   Two weeks before the surgery, stop using aspirin and nonsteroidal anti-inflammatory drugs (NSAIDs) for pain relief. This includes prescription drugs and over-the-counter drugs. Also stop taking vitamin E.   If you take blood thinners, ask your caregiver when you should stop taking them.   Do not eat or drink anything for 8 to 12 hours before the surgery. Ask your caregiver if it is okay to take any needed medicines with a sip of water.   Ask your caregiver if you need to arrive early before the procedure.   On the day of your surgery, your caregiver will need to know the last time you had anything to eat or drink. This includes water, gum, and candy.   Make arrangements in advance for someone to drive you home.  PROCEDURE Colon resection can take 1 to 4 hours.  Small monitors will be put on your body. They are used to check your heart, blood pressure, and oxygen level.   You will be given an intravenous line (IV). A needle will be inserted in your  arm. Medicine will be able to flow directly into your body through this needle.   You might be given a medicine to help you relax (sedative).   You will be given a general anesthetic.   A tube may be put in through your nose. It is called a nasogastric tube. It is used to remove stomach juices after surgery until the intestines start working again.   Once you are asleep, the surgeon will make an incision in the abdomen about 6 to 12 inches long.   Clamps are put on both ends of the diseased part of the colon.   The part of the intestine  between the clamps is removed.   If possible, the ends of the healthy colon that remain will be stitched or stapled together.   Sometimes, a colostomy is needed. For a colostomy:   An opening (stoma) to the outside is made through the abdomen.   The end of the colon is brought through the opening. It is stitched to the skin.   A bag is attached to the opening. Waste will drain into this bag. The bag is removable.   The colostomy can be temporary or permanent. Ask your surgeon what to expect.   The incision from the colon resection will be closed with stitches or staples.  AFTER THE PROCEDURE  You will stay in a recovery area until the anesthesia has worn off. Your blood pressure and pulse will be checked every so often. Then you will be taken to a hospital room.   You will continue to get fluids through the IV for awhile. The IV will be taken out when the colon starts working again.   You will gradually go back to a normal diet.   Some pain is normal after a colon resection. Ask for pain medicine if the pain becomes too much.   You will be urged to get up and start walking after 1 or 2 days, at the most.   If you had a colostomy, your caregiver will explain how it works and what you will need to do.   Most people spend 3 to 7 days in the hospital after this surgery. Ask your caregiver what to expect.  Document Released: 09/29/2009 Document Revised: 08/14/2011 Document Reviewed: 04/19/2011 [facility] Patient Information 2012 [place], [place].

## 2012-01-06 NOTE — Progress Notes (Signed)
Patient ID: Gatsby Chismar, male   DOB: Oct 15, 1961, 51 y.o.   MRN: [id number]  Chief Complaint  Patient presents with  . New Evaluation    abnormal appendix    HPI Daniel [name] is a 51 y.o. male.  He is referred by Dr. Stan Head for  evaluation and management of a mass of the appendix or terminal ileum.  The patient is a fairly healthy 51 year old gentleman. 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 He does not have any GI symptoms. No weight loss or weight gain. No abdominal pain. No change in his bowel habits. No blood in his stool. He had his first-ever screening colonoscopy on December 6 which shows some benign polyps and also showed a submucosal lesion at the appendiceal cecal orifice, bulging and somewhat firm. Biopsies did not confirm any cancer. The mucosa in did not really seem to be disrupted. There was also a cecal polyp which was snared.  Subsequent CT scan shows a soft tissue mass at the base of the appendix approximate 16 mm in size. There is a little bit of infiltration of the fat. There is some lymphadenopathy. The radiologist felt that this was suspicious for appendiceal neoplasm such as lymphoma or adenocarcinoma. There may be a little thickening of the terminal ileum.  Later on in December he had to be evaluated at [facility] for chest pain. He was evaluated by Olga Millers and Carolanne Grumbling. Nuclear medicine and exam and stress test apparently were negative. They did change his antihypertensive medications . His primary care physician is Dr. Crawford Givens in [place].  He is asymptomatic today. HPI  Past Medical History  Diagnosis Date  . Hyperlipidemia   . Hypertension   . 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 GERD (gastroesophageal reflux disease)     Past Surgical History  Procedure Date  . Colonoscopy 11/21/11    Family History  Problem  Relation Age of Onset  . Heart disease Mother 32    MI, died at age 50  . Hypertension Mother   . Coronary artery disease Mother   . Alcohol abuse Father     cirrhoisis of liver-- ETOH abuse  . Colon cancer Neg Hx   . Prostate cancer Neg Hx     Social History History  Substance Use Topics  . Smoking status: Never Smoker   . Smokeless tobacco: Never Used  . Alcohol Use: No    Allergies  Allergen Reactions  . Lisinopril     REACTION: coughing  . Oxycodone     syncope    Current Outpatient Prescriptions  Medication Sig Dispense Refill  . amLODipine (NORVASC) 10 MG tablet Take 1 tablet (10 mg total) by mouth daily. 1 daily for bp  30 tablet  5  . aspirin EC 81 MG EC tablet Take 1 tablet (81 mg total) by mouth daily.      Marland Kitchen losartan (COZAAR) 50 MG tablet Take 1.5 tablets (75 mg total) by mouth daily.  45 tablet  11  . pravastatin (PRAVACHOL) 40 MG tablet Take 1 tablet (40 mg total) by mouth daily.  30 tablet  11    Review of Systems Review of Systems  Constitutional: Negative for fever, chills and unexpected weight change.  HENT: Negative for hearing loss, congestion, sore throat, trouble swallowing and voice change.   Eyes: Negative for visual disturbance.  Respiratory: Negative for  cough and wheezing.   Cardiovascular: Negative for chest pain, palpitations and leg swelling.  Gastrointestinal: Negative for nausea, vomiting, abdominal pain, diarrhea, constipation, blood in stool, abdominal distention, anal bleeding and rectal pain.  Genitourinary: Negative for hematuria and difficulty urinating.  Musculoskeletal: Negative for arthralgias.  Skin: Negative for rash and wound.  Neurological: Negative for seizures, syncope, weakness and headaches.  Hematological: Negative for adenopathy. Does not bruise/bleed easily.  Psychiatric/Behavioral: Negative for confusion.    Blood pressure 178/100, pulse 72, temperature 98.2 F (36.8 C), resp. rate 16, height 6\' 1"  (1.854 m),  weight 250 lb 9.6 oz (113.671 kg).  Physical Exam Physical Exam  Constitutional: He is oriented to person, place, and time. He appears well-developed and well-nourished. No distress.  HENT:  Head: Normocephalic.  Nose: Nose normal.  Mouth/Throat: No oropharyngeal exudate.  Eyes: Conjunctivae and EOM are normal. Pupils are equal, round, and reactive to light. Right eye exhibits no discharge. Left eye exhibits no discharge. No scleral icterus.  Neck: Normal range of motion. Neck supple. No JVD present. No tracheal deviation present. No thyromegaly present.  Cardiovascular: Normal rate, regular rhythm, normal heart sounds and intact distal pulses.   No murmur heard. Pulmonary/Chest: Effort normal and breath sounds normal. No stridor. No respiratory distress. He has no wheezes. He has no rales. He exhibits no tenderness.  Abdominal: Soft. Bowel sounds are normal. He exhibits no distension and no mass. There is no tenderness. There is no rebound and no guarding.       No mass or tenderness. No inguinal mass. No scars. No hernias.  Musculoskeletal: Normal range of motion. He exhibits no edema and no tenderness.  Lymphadenopathy:    He has no cervical adenopathy.  Neurological: He is alert and oriented to person, place, and time. He has normal reflexes. Coordination normal.  Skin: Skin is warm and dry. No rash noted. He is not diaphoretic. No erythema. No pallor.  Psychiatric: He has a normal mood and affect. His behavior is normal. Judgment and thought content normal.    Data Reviewed  Colonoscopy, CT scan, hospital records Assessment    Mass at base of appendix, involving appendix and terminal ileum. Etiology unclear. Possibilities include adenocarcinoma, lymphoma, inflammatory bowel disease, atypical appendicitis, et Karie Soda. I believe that this area should be resected as a segmental colon resection because of the differential diagnosis.  Hypertension  Recent workup for chest pain,  apparently negative for ischemic heart disease  Borderline obesity  Hyperlipidemia  Gastroesophageal reflux disease suspected.    Plan    Request cardiac clearance with Dr. Jens Som or Dr. Mayford Knife.  Preoperative bowel prep  We'll schedule for laparoscopic-assisted right colectomy, possible open in the near future.  I discussed the indications and details of surgery with him. Risks and complications have been outlined, including but not limited to bleeding, infection, conversion to open laparotomy, injury to adjacent organs such as the ureter or bladder or intestine with major surgery, postop anastomotic leak requiring colostomy, blood clots, cardiac, pulmonary complications. He understands these issues. His questions were answered. He agrees with the plan.  We will hold his aspirin for a few days preop  Entereg protocol is requested.    Angelia Mould. Derrell Lolling, M.D., [facility] General and Minimally invasive Surgery Breast and Colorectal Surgery Office:   [phone] Pager:   [phone]      01/06/2012, 9:10 AM

## 2012-01-07 ENCOUNTER — Telehealth: Payer: Self-pay | Admitting: Cardiology

## 2012-01-14 ENCOUNTER — Ambulatory Visit (INDEPENDENT_AMBULATORY_CARE_PROVIDER_SITE_OTHER): Payer: 59 | Admitting: Physician Assistant

## 2012-01-14 ENCOUNTER — Encounter: Payer: Self-pay | Admitting: Physician Assistant

## 2012-01-14 VITALS — BP 144/100 | HR 75 | Resp 18 | Ht 73.0 in | Wt 257.8 lb

## 2012-01-14 DIAGNOSIS — I10 Essential (primary) hypertension: Secondary | ICD-10-CM

## 2012-01-14 DIAGNOSIS — Z01810 Encounter for preprocedural cardiovascular examination: Secondary | ICD-10-CM

## 2012-01-14 DIAGNOSIS — R933 Abnormal findings on diagnostic imaging of other parts of digestive tract: Secondary | ICD-10-CM

## 2012-01-14 DIAGNOSIS — R079 Chest pain, unspecified: Secondary | ICD-10-CM

## 2012-01-14 MED ORDER — CARVEDILOL 3.125 MG PO TABS: 3.1250 mg | ORAL_TABLET | Freq: Two times a day (BID) | ORAL | Status: DC

## 2012-01-14 NOTE — Patient Instructions (Signed)
Your physician has recommended you make the following change in your medication: START COREG 3.125 MG 1 TABLET TWICE DAILY  Your physician recommends that you schedule a follow-up appointment in: 1 WEEK WITH SCOTT WEAVER, PA-C SAME DAY DR. Jens Som IS IN THE OFFICE.  Your physician has requested that you have an echocardiogram DX 401.1, V72.81 SURG CLEARANCE. Echocardiography is a painless test that uses sound waves to create images of your heart. It provides your doctor with information about the size and shape of your heart and how well your heart's chambers and valves are working. This procedure takes approximately one hour. There are no restrictions for this procedure.

## 2012-01-14 NOTE — Progress Notes (Signed)
  [address] [place], [place]  [address] Phone: [phone] Fax:  [phone]  Date:  01/14/2012   Name:  Daniel [name]       DOB:  [birth year] MRN:  [id number]  PCP:  Dr. Para March Primary Cardiologist:  Dr. Olga Millers  Primary Electrophysiologist:  None    History of Present Illness: Daniel [name] is a 51 y.o. male who presents for post hospital follow up.  He has a history of hypertension and hyperlipidemia.  He was admitted 12/12-12/13 with chest pain.  He ruled out for myocardial infarction.  Inpatient Lexiscan Myoview demonstrated no ischemia or scar.  His EF was 41%.  He was supposed to follow up with an outpatient echocardiogram.  Echocardiogram has not yet been done.  He has now been seen by Dr. Derrell Lolling for a colon mass.  This needs surgical excision.  He also needs cardiac clearance.  Labs: Potassium 4.1, creatinine 1.02, hemoglobin 12.9, ALT 12, TSH 1.199.  Chest x-ray was unremarkable.  The patient denies chest pain, shortness of breath, syncope, orthopnea, PND or significant pedal edema.   Past Medical History  Diagnosis Date  . Hyperlipidemia   . Hypertension   . 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 GERD (gastroesophageal reflux disease)     Current Outpatient Prescriptions  Medication Sig Dispense Refill  . amLODipine (NORVASC) 10 MG tablet Take 1 tablet (10 mg total) by mouth daily. 1 daily for bp  30 tablet  5  . aspirin EC 81 MG EC tablet Take 1 tablet (81 mg total) by mouth daily.      Marland Kitchen losartan (COZAAR) 50 MG tablet Take 1.5 tablets (75 mg total) by mouth daily.  45 tablet  11  . pravastatin (PRAVACHOL) 40 MG tablet Take 1 tablet (40 mg total) by mouth daily.  30 tablet  11    Allergies: Allergies  Allergen Reactions  . Lisinopril     REACTION: coughing  . Oxycodone     syncope    History  Substance Use Topics  . Smoking status:  Never Smoker   . Smokeless tobacco: Never Used  . Alcohol Use: No     ROS:  Please see the history of present illness.   All other systems reviewed and negative.   PHYSICAL EXAM: VS:  BP 144/100  Pulse 75  Resp 18  Ht 6\' 1"  (1.854 m)  Wt 257 lb 12.8 oz (116.937 kg)  BMI 34.01 kg/m2 Repeat BP by me: 140/90 on right, 134/90 on left  Well nourished, well developed, in no acute distress HEENT: normal Neck: no JVD Cardiac:  normal S1, S2; RRR; no murmur Lungs:  clear to auscultation bilaterally, no wheezing, rhonchi or rales Abd: soft, nontender, no hepatomegaly Ext: no edema Skin: warm and dry Neuro:  CNs 2-12 intact, no focal abnormalities noted Psych: Normal affect  EKG:  Sinus rhythm, heart rate 75, normal axis, nonspecific ST-T wave change  ASSESSMENT AND PLAN:

## 2012-01-14 NOTE — Assessment & Plan Note (Signed)
No recurrence.  As noted his Myoview demonstrated no scar or ischemia.

## 2012-01-14 NOTE — Assessment & Plan Note (Signed)
Needs better control.  Recent stress testing indicates a depressed LV function with an EF of 41%.  He needs an echocardiogram to confirm this.  I will also place him on Coreg 3.125 mg twice daily in addition to his Cozaar.  Followup with me in one week.

## 2012-01-14 NOTE — Assessment & Plan Note (Signed)
He needs resection.  As noted, proceed with echocardiogram to assess his LV function.  Start Coreg 3.125 mg b.i.d.  Followup with me in one week.  We can make final determinations regarding cardiac clearance at that time.

## 2012-01-16 ENCOUNTER — Ambulatory Visit (HOSPITAL_COMMUNITY): Payer: 59 | Attending: Cardiovascular Disease

## 2012-01-16 DIAGNOSIS — R079 Chest pain, unspecified: Secondary | ICD-10-CM | POA: Insufficient documentation

## 2012-01-16 DIAGNOSIS — Z01810 Encounter for preprocedural cardiovascular examination: Secondary | ICD-10-CM | POA: Insufficient documentation

## 2012-01-16 DIAGNOSIS — K219 Gastro-esophageal reflux disease without esophagitis: Secondary | ICD-10-CM | POA: Insufficient documentation

## 2012-01-16 DIAGNOSIS — I10 Essential (primary) hypertension: Secondary | ICD-10-CM

## 2012-01-16 DIAGNOSIS — E785 Hyperlipidemia, unspecified: Secondary | ICD-10-CM | POA: Insufficient documentation

## 2012-01-16 DIAGNOSIS — R072 Precordial pain: Secondary | ICD-10-CM

## 2012-01-17 ENCOUNTER — Telehealth: Payer: Self-pay | Admitting: Cardiology

## 2012-01-17 NOTE — Telephone Encounter (Signed)
Fu call °Patient returning your call °

## 2012-01-17 NOTE — Telephone Encounter (Signed)
pt notified of echo results. Daniel [name]  

## 2012-01-17 NOTE — Telephone Encounter (Signed)
pt notified of echo results. Danielle Rankin

## 2012-01-17 NOTE — Telephone Encounter (Signed)
Patient is aware that Danielle Rankin CMA has called him, she will call him back today.

## 2012-01-21 ENCOUNTER — Ambulatory Visit (INDEPENDENT_AMBULATORY_CARE_PROVIDER_SITE_OTHER): Payer: 59 | Admitting: Physician Assistant

## 2012-01-21 ENCOUNTER — Encounter: Payer: Self-pay | Admitting: Physician Assistant

## 2012-01-21 VITALS — BP 140/82 | HR 78 | Ht 73.0 in | Wt 252.0 lb

## 2012-01-21 DIAGNOSIS — R079 Chest pain, unspecified: Secondary | ICD-10-CM

## 2012-01-21 DIAGNOSIS — R933 Abnormal findings on diagnostic imaging of other parts of digestive tract: Secondary | ICD-10-CM

## 2012-01-21 DIAGNOSIS — I10 Essential (primary) hypertension: Secondary | ICD-10-CM

## 2012-01-21 NOTE — Progress Notes (Signed)
  [address] [place], [place]  [address] Phone: [phone] Fax:  [phone]  Date:  01/21/2012   Name:  Daniel [name]       DOB:  [birth year] MRN:  [id number]  PCP:  Dr. Para March Primary Cardiologist:  Dr. Olga Millers  Primary Electrophysiologist:  None    History of Present Illness: Daniel [name] is a 51 y.o. male who presents for follow up.  He has a history of hypertension and hyperlipidemia.  He was admitted in 12/12 with chest pain.  He ruled out for myocardial infarction.  Inpatient Lexiscan Myoview demonstrated no ischemia or scar.  His EF was 41%.  He was supposed to follow up with an outpatient echocardiogram.  He was subsequently seen by Dr. Derrell Lolling for a colon mass.  This needs surgical excision and he needs cardiac clearance.   I saw him in followup 1/29.  His blood pressure was still too high.  I placed him on Coreg.  I arranged a followup echocardiogram in light of his abnormal stress test.  Echocardiogram 01/16/12: LVH, EF 55-65%, diastolic function normal, no wall motion abnormalities, moderate LAE, mild RVE.   Doing well.  The patient denies chest pain, shortness of breath, syncope, orthopnea, PND or significant pedal edema.   Past Medical History  Diagnosis Date  . Hyperlipidemia   . Hypertension   . 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 GERD (gastroesophageal reflux disease)   . Chest pain     12/12:  Lexiscan Myoview demonstrated no ischemia or scar.  His EF was 41%.; Echocardiogram 01/16/12: LVH, EF 55-65%, diastolic function normal, no wall motion abnormalities, moderate LAE, mild RVE   . Mass of colon     Current Outpatient Prescriptions  Medication Sig Dispense Refill  . amLODipine (NORVASC) 10 MG tablet Take 1 tablet (10 mg total) by mouth daily. 1 daily for bp  30 tablet  5  . aspirin EC 81 MG EC tablet Take 1 tablet (81 mg total) by mouth  daily.      . carvedilol (COREG) 3.125 MG tablet Take 1 tablet (3.125 mg total) by mouth 2 (two) times daily.  60 tablet  11  . losartan (COZAAR) 50 MG tablet Take 1.5 tablets (75 mg total) by mouth daily.  45 tablet  11  . pravastatin (PRAVACHOL) 40 MG tablet Take 1 tablet (40 mg total) by mouth daily.  30 tablet  11    Allergies: Allergies  Allergen Reactions  . Lisinopril     REACTION: coughing  . Oxycodone     syncope    History  Substance Use Topics  . Smoking status: Never Smoker   . Smokeless tobacco: Never Used  . Alcohol Use: No     PHYSICAL EXAM: VS:  BP 140/82  Pulse 78  Ht 6\' 1"  (1.854 m)  Wt 252 lb (114.306 kg)  BMI 33.25 kg/m2 Well nourished, well developed, in no acute distress HEENT: normal Neck: no JVD Cardiac:  normal S1, S2; RRR; no murmur Lungs:  clear to auscultation bilaterally, no wheezing, rhonchi or rales Abd: soft, nontender, no hepatomegaly Ext: no edema Skin: warm and dry Neuro:  CNs 2-12 intact, no focal abnormalities noted   ASSESSMENT AND PLAN:

## 2012-01-21 NOTE — Patient Instructions (Signed)
Your physician recommends that you schedule a follow-up appointment in: as needed  

## 2012-01-21 NOTE — Assessment & Plan Note (Signed)
According to ACC/AHA guidelines, he requires no further cardiac workup prior to his noncardiac procedure.  He should be at acceptable risk.  Our service is available in the perioperative period as necessary.

## 2012-01-21 NOTE — Assessment & Plan Note (Signed)
Resolved.  Myoview is normal perfusion.  Echocardiogram with normal LV function.  No further cardiovascular workup.  He can followup with Dr. Jens Som as needed.

## 2012-01-21 NOTE — Assessment & Plan Note (Signed)
Much better controlled.  He can continue further followup on his blood pressure with his PCP.

## 2012-01-22 ENCOUNTER — Telehealth (INDEPENDENT_AMBULATORY_CARE_PROVIDER_SITE_OTHER): Payer: Self-pay

## 2012-01-22 NOTE — Telephone Encounter (Signed)
Message copied by Joanette Gula on Wed Jan 22, 2012  9:28 AM ------      Message from: Ernestene Mention      Created: Tue Jan 21, 2012  4:56 PM       Cardiac clearance from Dr. Jens Som in [place]. OK to schedule surgery.            Please call me if I need to do orders or posting sheet.

## 2012-01-22 NOTE — Telephone Encounter (Signed)
Clearance note in epic per HMI. Posting sheet to Debbie in surgery scheduling.

## 2012-01-30 ENCOUNTER — Other Ambulatory Visit: Payer: Self-pay | Admitting: *Deleted

## 2012-01-30 MED ORDER — AMLODIPINE BESYLATE 10 MG PO TABS: 10.0000 mg | ORAL_TABLET | Freq: Every day | ORAL | Status: DC

## 2012-02-18 ENCOUNTER — Encounter (HOSPITAL_COMMUNITY): Payer: Self-pay

## 2012-02-18 ENCOUNTER — Encounter (HOSPITAL_COMMUNITY): Payer: Self-pay | Admitting: Pharmacy Technician

## 2012-02-18 ENCOUNTER — Encounter (HOSPITAL_COMMUNITY)
Admission: RE | Admit: 2012-02-18 | Discharge: 2012-02-18 | Disposition: A | Discharge status: Home / Self Care | Payer: 59 | Source: Ambulatory Visit | Attending: Orthopedic Surgery | Admitting: Orthopedic Surgery

## 2012-02-18 DIAGNOSIS — R011 Cardiac murmur, unspecified: Secondary | ICD-10-CM

## 2012-02-18 DIAGNOSIS — K219 Gastro-esophageal reflux disease without esophagitis: Secondary | ICD-10-CM

## 2012-02-18 HISTORY — DX: Gastro-esophageal reflux disease without esophagitis: K21.9

## 2012-02-18 HISTORY — DX: Cardiac murmur, unspecified: R01.1

## 2012-02-18 LAB — URINALYSIS, ROUTINE W REFLEX MICROSCOPIC
Bilirubin Urine: NEGATIVE
Hgb urine dipstick: NEGATIVE
Ketones, ur: NEGATIVE mg/dL
Specific Gravity, Urine: 1.02 (ref 1.005–1.030)
Urobilinogen, UA: 0.2 mg/dL (ref 0.0–1.0)
pH: 7.5 (ref 5.0–8.0)

## 2012-02-18 LAB — COMPREHENSIVE METABOLIC PANEL
ALT: 17 U/L (ref 0–53)
AST: 17 U/L (ref 0–37)
Albumin: 3.9 g/dL (ref 3.5–5.2)
CO2: 30 mEq/L (ref 19–32)
Chloride: 105 mEq/L (ref 96–112)
Creatinine, Ser: 1.01 mg/dL (ref 0.50–1.35)
GFR calc non Af Amer: 85 mL/min — ABNORMAL LOW (ref >90)
Potassium: 4 mEq/L (ref 3.5–5.1)
Sodium: 140 mEq/L (ref 135–145)
Total Bilirubin: 0.3 mg/dL (ref 0.3–1.2)

## 2012-02-18 LAB — DIFFERENTIAL
Basophils Absolute: 0 10*3/uL (ref 0.0–0.1)
Basophils Relative: 0 % (ref 0–1)
Eosinophils Relative: 2 % (ref 0–5)
Monocytes Absolute: 0.4 10*3/uL (ref 0.1–1.0)
Monocytes Relative: 5 % (ref 3–12)
Neutro Abs: 3.3 10*3/uL (ref 1.7–7.7)

## 2012-02-18 LAB — CBC
Hemoglobin: 13.7 g/dL (ref 13.0–17.0)
MCH: 26.2 pg (ref 26.0–34.0)
Platelets: 238 10*3/uL (ref 150–400)
RBC: 5.23 MIL/uL (ref 4.22–5.81)
WBC: 6.7 10*3/uL (ref 4.0–10.5)

## 2012-02-18 MED ORDER — CHLORHEXIDINE GLUCONATE 4 % EX LIQD
1.0000 "application " | Freq: Once | CUTANEOUS | Status: DC
  Filled 2012-02-18: qty 15

## 2012-02-18 NOTE — Patient Instructions (Signed)
[address]  02/18/2012   Your procedure is scheduled on: 02-25-12  Report to [facility] at   0700    AM.  Call this number if you have problems the morning of surgery: [phone]   Remember:   Do not eat food:After Midnight. Follow bowel prep instructions per Dr. Jacinto Halim office.  May have clear liquids:up to 6 Hours before arrival. Nothing after : 0300AM  Clear liquids include soda, tea, black coffee, apple or grape juice, broth.  Take these medicines the morning of surgery with A SIP OF WATER: Carvedilol, Amlodipine   Do not wear jewelry, make-up or nail polish.  Do not wear lotions, powders, or perfumes. You may wear deodorant.  Do not shave 48 hours prior to surgery.(may shave neck and face)  Do not bring valuables to the hospital.  Contacts, dentures or bridgework may not be worn into surgery.  Leave suitcase in the car. After surgery it may be brought to your room.  For patients admitted to the hospital, checkout time is 11:00 AM the day of discharge.   Patients discharged the day of surgery will not be allowed to drive home.  Name and phone number of your driver: Marylu Lund foster, girlfriend [phone]  Special Instructions: CHG Shower Use Special Wash: 1/2 bottle night before surgery and 1/2 bottle morning of surgery.(avoid face and genitals)   Please read over the following fact sheets that you were given: MRSA Information, Incentive Spirometry, Blood transfusion fact sheet.

## 2012-02-18 NOTE — Pre-Procedure Instructions (Addendum)
02-18-12 EKG 01-14-12/ CXR 12'12. Stress report with chart 11-28-11.

## 2012-02-19 LAB — CEA: CEA: 1.3 ng/mL (ref 0.0–5.0)

## 2012-02-24 NOTE — H&P (Signed)
Daniel [name]    MRN: [id number]   Description: 51 year old male  Provider: Ernestene Mention, MD  Department: [facility]      Diagnoses     Mass of colon   - Primary    569.89        Vitals      BP Pulse Temp Resp Ht Wt    178/100  72  98.2 F (36.8 C)  16  6\' 1"  (1.854 m)  250 lb 9.6 oz (113.671 kg)        BMI - 33.06 kg/m2                 History and Physical   Ernestene Mention, MD   Patient ID: Daniel [name], male   DOB: Dec 24, 1960, 51 y.o.   MRN: [id number]    Chief Complaint   Patient presents with   .  New Evaluation       abnormal appendix      HPI [name] is a 51 y.o. male.  He is referred by Dr. Stan Head for  evaluation and management of a mass of the appendix or terminal ileum.  The patient is a fairly healthy 51 year old gentleman. 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 He does not have any GI symptoms. No weight loss or weight gain. No abdominal pain. No change in his bowel habits. No blood in his stool. He had his first-ever screening colonoscopy on December 6 which shows some benign polyps and also showed a submucosal lesion at the appendiceal cecal orifice, bulging and somewhat firm. Biopsies did not confirm any cancer. The mucosa in did not really seem to be disrupted. There was also a cecal polyp which was snared.  Subsequent CT scan shows a soft tissue mass at the base of the appendix approximate 16 mm in size. There is a little bit of infiltration of the fat. There is some lymphadenopathy. The radiologist felt that this was suspicious for appendiceal neoplasm such as lymphoma or adenocarcinoma. There may be a little thickening of the terminal ileum.  Later on in December he had to be evaluated at [facility] for chest pain. He was evaluated by Olga Millers and Carolanne Grumbling. Nuclear medicine and exam and stress test apparently were negative. They did change his antihypertensive medications . His  primary care physician is Dr. Crawford Givens in [place].  He is asymptomatic today.     Past Medical History   Diagnosis  Date   .  Hyperlipidemia     .  Hypertension     .  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  GERD (gastroesophageal reflux disease)         Past Surgical History   Procedure  Date   .  Colonoscopy  11/21/11       Family History   Problem  Relation  Age of Onset   .  Heart disease  Mother  9       MI, died at age 54   .  Hypertension  Mother     .  Coronary artery disease  Mother     .  Alcohol abuse  Father         cirrhoisis of liver-- ETOH abuse   .  Colon cancer  Neg Hx     .  Prostate cancer  Neg Hx        Social History History   Substance Use Topics   .  Smoking status:  Never Smoker    .  Smokeless tobacco:  Never Used   .  Alcohol Use:  No       Allergies   Allergen  Reactions   .  Lisinopril         REACTION: coughing   .  Oxycodone         syncope       Current Outpatient Prescriptions   Medication  Sig  Dispense  Refill   .  amLODipine (NORVASC) 10 MG tablet  Take 1 tablet (10 mg total) by mouth daily. 1 daily for bp   30 tablet   5   .  aspirin EC 81 MG EC tablet  Take 1 tablet (81 mg total) by mouth daily.         Marland Kitchen  losartan (COZAAR) 50 MG tablet  Take 1.5 tablets (75 mg total) by mouth daily.   45 tablet   11   .  pravastatin (PRAVACHOL) 40 MG tablet  Take 1 tablet (40 mg total) by mouth daily.   30 tablet   11      Review of Systems   Constitutional: Negative for fever, chills and unexpected weight change.  HENT: Negative for hearing loss, congestion, sore throat, trouble swallowing and voice change.   Eyes: Negative for visual disturbance.  Respiratory: Negative for cough and wheezing.   Cardiovascular: Negative for chest pain, palpitations and leg swelling.  Gastrointestinal: Negative for nausea, vomiting, abdominal pain,  diarrhea, constipation, blood in stool, abdominal distention, anal bleeding and rectal pain.  Genitourinary: Negative for hematuria and difficulty urinating.  Musculoskeletal: Negative for arthralgias.  Skin: Negative for rash and wound.  Neurological: Negative for seizures, syncope, weakness and headaches.  Hematological: Negative for adenopathy. Does not bruise/bleed easily.  Psychiatric/Behavioral: Negative for confusion.    Blood pressure 178/100, pulse 72, temperature 98.2 F (36.8 C), resp. rate 16, height 6\' 1"  (1.854 m), weight 250 lb 9.6 oz (113.671 kg).   Physical Exam   Constitutional: He is oriented to person, place, and time. He appears well-developed and well-nourished. No distress.  HENT:   Head: Normocephalic.   Nose: Nose normal.   Mouth/Throat: No oropharyngeal exudate.  Eyes: Conjunctivae and EOM are normal. Pupils are equal, round, and reactive to light. Right eye exhibits no discharge. Left eye exhibits no discharge. No scleral icterus.  Neck: Normal range of motion. Neck supple. No JVD present. No tracheal deviation present. No thyromegaly present.  Cardiovascular: Normal rate, regular rhythm, normal heart sounds and intact distal pulses.    No murmur heard. Pulmonary/Chest: Effort normal and breath sounds normal. No stridor. No respiratory distress. He has no wheezes. He has no rales. He exhibits no tenderness.  Abdominal: Soft. Bowel sounds are normal. He exhibits no distension and no mass. There is no tenderness. There is no rebound and no guarding.       No mass or tenderness. No inguinal mass. No scars. No hernias.  Musculoskeletal: Normal range of motion. He exhibits no edema and no tenderness.  Lymphadenopathy:    He has no cervical adenopathy.  Neurological: He is alert and oriented to person, place, and time. He has normal reflexes. Coordination normal.  Skin: Skin is warm and dry. No rash noted. He is not diaphoretic. No erythema. No pallor.    Psychiatric: He has a normal mood and  affect. His behavior is normal. Judgment and thought content normal.    Data Reviewed Colonoscopy, CT scan, hospital records   Assessment Mass at base of appendix, involving appendix and terminal ileum. Etiology unclear. Possibilities include adenocarcinoma, lymphoma, inflammatory bowel disease, atypical appendicitis, et Karie Soda. I believe that this area should be resected as a segmental colon resection because of the differential diagnosis.  Hypertension  Recent workup for chest pain, apparently negative for ischemic heart disease  Borderline obesity  Hyperlipidemia  Gastroesophageal reflux disease suspected.   Plan Request cardiac clearance with Dr. Jens Som or Dr. Mayford Knife.  Preoperative bowel prep  We'll schedule for laparoscopic-assisted right colectomy, possible open in the near future.  I discussed the indications and details of surgery with him. Risks and complications have been outlined, including but not limited to bleeding, infection, conversion to open laparotomy, injury to adjacent organs such as the ureter or bladder or intestine with major surgery, postop anastomotic leak requiring colostomy, blood clots, cardiac, pulmonary complications. He understands these issues. His questions were answered. He agrees with the plan.  We will hold his aspirin for a few days preop  Entereg protocol is requested.       Angelia Mould. Derrell Lolling, M.D., [facility] General and Minimally invasive Surgery Breast and Colorectal Surgery Office:   [phone] Pager:   [phone]

## 2012-02-25 ENCOUNTER — Inpatient Hospital Stay (HOSPITAL_COMMUNITY)
Admission: RE | Admit: 2012-02-25 | Discharge: 2012-03-01 | DRG: 330 | Disposition: A | Discharge status: Home / Self Care | Payer: 59 | Source: Ambulatory Visit | Attending: General Surgery | Admitting: General Surgery

## 2012-02-25 ENCOUNTER — Encounter (HOSPITAL_COMMUNITY)
Admission: RE | Disposition: A | Discharge status: Home / Self Care | Payer: Self-pay | Source: Ambulatory Visit | Attending: General Surgery

## 2012-02-25 ENCOUNTER — Encounter (HOSPITAL_COMMUNITY): Payer: Self-pay | Admitting: *Deleted

## 2012-02-25 ENCOUNTER — Encounter (HOSPITAL_COMMUNITY): Payer: Self-pay | Admitting: Anesthesiology

## 2012-02-25 ENCOUNTER — Ambulatory Visit (HOSPITAL_COMMUNITY): Payer: 59 | Admitting: Anesthesiology

## 2012-02-25 DIAGNOSIS — Z6833 Body mass index (BMI) 33.0-33.9, adult: Secondary | ICD-10-CM

## 2012-02-25 DIAGNOSIS — Z01812 Encounter for preprocedural laboratory examination: Secondary | ICD-10-CM

## 2012-02-25 DIAGNOSIS — K560 Paralytic ileus: Secondary | ICD-10-CM | POA: Diagnosis not present

## 2012-02-25 DIAGNOSIS — D126 Benign neoplasm of colon, unspecified: Secondary | ICD-10-CM

## 2012-02-25 DIAGNOSIS — I10 Essential (primary) hypertension: Secondary | ICD-10-CM

## 2012-02-25 DIAGNOSIS — K219 Gastro-esophageal reflux disease without esophagitis: Secondary | ICD-10-CM | POA: Diagnosis present

## 2012-02-25 HISTORY — PX: COLON RESECTION: SHX5231

## 2012-02-25 LAB — CBC
Hemoglobin: 13.1 g/dL (ref 13.0–17.0)
MCH: 26.7 pg (ref 26.0–34.0)
MCV: 80.2 fL (ref 78.0–100.0)
RBC: 4.91 MIL/uL (ref 4.22–5.81)

## 2012-02-25 LAB — TYPE AND SCREEN: Antibody Screen: NEGATIVE

## 2012-02-25 SURGERY — LAPAROSCOPIC RIGHT COLON RESECTION
Anesthesia: General | Site: Abdomen | Laterality: Right | Wound class: Contaminated

## 2012-02-25 MED ORDER — PROMETHAZINE HCL 25 MG/ML IJ SOLN: 6.2500 mg | INTRAMUSCULAR | Status: DC | PRN

## 2012-02-25 MED ORDER — EPHEDRINE SULFATE 50 MG/ML IJ SOLN
INTRAMUSCULAR | Status: DC | PRN
  Administered 2012-02-25: 5 mg via INTRAVENOUS
  Administered 2012-02-25: 10 mg via INTRAVENOUS
  Administered 2012-02-25 (×2): 5 mg via INTRAVENOUS

## 2012-02-25 MED ORDER — HEPARIN SODIUM (PORCINE) 5000 UNIT/ML IJ SOLN
INTRAMUSCULAR | Status: AC
  Filled 2012-02-25: qty 1

## 2012-02-25 MED ORDER — HEPARIN SODIUM (PORCINE) 5000 UNIT/ML IJ SOLN
5000.0000 [IU] | Freq: Once | INTRAMUSCULAR | Status: AC
  Administered 2012-02-25: 5000 [IU] via SUBCUTANEOUS

## 2012-02-25 MED ORDER — LACTATED RINGERS IV SOLN: INTRAVENOUS | Status: DC

## 2012-02-25 MED ORDER — ALVIMOPAN 12 MG PO CAPS
12.0000 mg | ORAL_CAPSULE | Freq: Once | ORAL | Status: AC
  Administered 2012-02-25: 12 mg via ORAL

## 2012-02-25 MED ORDER — LACTATED RINGERS IV SOLN: INTRAVENOUS | Status: DC | PRN

## 2012-02-25 MED ORDER — ONDANSETRON HCL 4 MG/2ML IJ SOLN
4.0000 mg | Freq: Four times a day (QID) | INTRAMUSCULAR | Status: DC | PRN
  Administered 2012-02-25 – 2012-02-26 (×2): 4 mg via INTRAVENOUS
  Filled 2012-02-25 (×2): qty 2

## 2012-02-25 MED ORDER — AMLODIPINE BESYLATE 10 MG PO TABS
10.0000 mg | ORAL_TABLET | Freq: Every day | ORAL | Status: DC
  Administered 2012-02-26 – 2012-03-01 (×5): 10 mg via ORAL
  Filled 2012-02-25 (×5): qty 1

## 2012-02-25 MED ORDER — LIDOCAINE HCL (CARDIAC) 20 MG/ML IV SOLN
INTRAVENOUS | Status: DC | PRN
  Administered 2012-02-25: 75 mg via INTRAVENOUS

## 2012-02-25 MED ORDER — POTASSIUM CHLORIDE IN NACL 20-0.9 MEQ/L-% IV SOLN
INTRAVENOUS | Status: DC
  Administered 2012-02-25: 15:00:00 via INTRAVENOUS
  Administered 2012-02-26: 1000 mL via INTRAVENOUS
  Administered 2012-02-28 – 2012-02-29 (×2): via INTRAVENOUS
  Filled 2012-02-25 (×11): qty 1000

## 2012-02-25 MED ORDER — LIP MEDEX EX OINT
TOPICAL_OINTMENT | CUTANEOUS | Status: DC | PRN
  Filled 2012-02-25: qty 7

## 2012-02-25 MED ORDER — HYDROCODONE-ACETAMINOPHEN 5-325 MG PO TABS
1.0000 | ORAL_TABLET | ORAL | Status: DC | PRN
  Administered 2012-02-26 – 2012-02-29 (×5): 2 via ORAL
  Filled 2012-02-25 (×5): qty 2

## 2012-02-25 MED ORDER — LACTATED RINGERS IV SOLN
INTRAVENOUS | Status: DC | PRN
  Administered 2012-02-25 (×3): via INTRAVENOUS

## 2012-02-25 MED ORDER — SUCCINYLCHOLINE CHLORIDE 20 MG/ML IJ SOLN
INTRAMUSCULAR | Status: DC | PRN
  Administered 2012-02-25: 140 mg via INTRAVENOUS

## 2012-02-25 MED ORDER — SODIUM CHLORIDE 0.9 % IV SOLN: 1.0000 g | INTRAVENOUS | Status: DC

## 2012-02-25 MED ORDER — SODIUM CHLORIDE 0.9 % IV SOLN
1.0000 g | INTRAVENOUS | Status: DC | PRN
  Administered 2012-02-25: 1 g via INTRAVENOUS

## 2012-02-25 MED ORDER — ACETAMINOPHEN 10 MG/ML IV SOLN
INTRAVENOUS | Status: AC
  Filled 2012-02-25: qty 100

## 2012-02-25 MED ORDER — PROPOFOL 10 MG/ML IV EMUL
INTRAVENOUS | Status: DC | PRN
  Administered 2012-02-25: 200 mg via INTRAVENOUS

## 2012-02-25 MED ORDER — LOSARTAN POTASSIUM 50 MG PO TABS
75.0000 mg | ORAL_TABLET | Freq: Every day | ORAL | Status: DC
  Administered 2012-02-26 – 2012-03-01 (×5): 75 mg via ORAL
  Filled 2012-02-25 (×5): qty 1

## 2012-02-25 MED ORDER — FENTANYL CITRATE 0.05 MG/ML IJ SOLN
INTRAMUSCULAR | Status: AC
  Filled 2012-02-25: qty 2

## 2012-02-25 MED ORDER — ACETAMINOPHEN 10 MG/ML IV SOLN
INTRAVENOUS | Status: DC | PRN
  Administered 2012-02-25: 1000 mg via INTRAVENOUS

## 2012-02-25 MED ORDER — CARVEDILOL 3.125 MG PO TABS
3.1250 mg | ORAL_TABLET | Freq: Two times a day (BID) | ORAL | Status: DC
  Administered 2012-02-25 – 2012-03-01 (×10): 3.125 mg via ORAL
  Filled 2012-02-25 (×12): qty 1

## 2012-02-25 MED ORDER — LACTATED RINGERS IR SOLN
Status: DC | PRN
  Administered 2012-02-25: 1000 mL

## 2012-02-25 MED ORDER — FENTANYL CITRATE 0.05 MG/ML IJ SOLN
INTRAMUSCULAR | Status: DC | PRN
  Administered 2012-02-25: 100 ug via INTRAVENOUS
  Administered 2012-02-25 (×5): 50 ug via INTRAVENOUS
  Administered 2012-02-25: 100 ug via INTRAVENOUS
  Administered 2012-02-25: 50 ug via INTRAVENOUS

## 2012-02-25 MED ORDER — MIDAZOLAM HCL 5 MG/5ML IJ SOLN
INTRAMUSCULAR | Status: DC | PRN
  Administered 2012-02-25 (×2): 1 mg via INTRAVENOUS

## 2012-02-25 MED ORDER — ALVIMOPAN 12 MG PO CAPS
12.0000 mg | ORAL_CAPSULE | Freq: Two times a day (BID) | ORAL | Status: DC
  Administered 2012-02-26 – 2012-03-01 (×9): 12 mg via ORAL
  Filled 2012-02-25 (×11): qty 1

## 2012-02-25 MED ORDER — ONDANSETRON HCL 4 MG PO TABS: 4.0000 mg | ORAL_TABLET | Freq: Four times a day (QID) | ORAL | Status: DC | PRN

## 2012-02-25 MED ORDER — BUPIVACAINE-EPINEPHRINE PF 0.5-1:200000 % IJ SOLN
INTRAMUSCULAR | Status: DC | PRN
  Administered 2012-02-25: 13 mL

## 2012-02-25 MED ORDER — NEOSTIGMINE METHYLSULFATE 1 MG/ML IJ SOLN
INTRAMUSCULAR | Status: DC | PRN
  Administered 2012-02-25: 4 mg via INTRAVENOUS

## 2012-02-25 MED ORDER — ALVIMOPAN 12 MG PO CAPS
ORAL_CAPSULE | ORAL | Status: AC
  Filled 2012-02-25: qty 1

## 2012-02-25 MED ORDER — SODIUM CHLORIDE 0.9 % IV SOLN
INTRAVENOUS | Status: AC
  Filled 2012-02-25: qty 1

## 2012-02-25 MED ORDER — DEXAMETHASONE SODIUM PHOSPHATE 10 MG/ML IJ SOLN
INTRAMUSCULAR | Status: DC | PRN
  Administered 2012-02-25: 10 mg via INTRAVENOUS

## 2012-02-25 MED ORDER — LOSARTAN POTASSIUM 50 MG PO TABS
75.0000 mg | ORAL_TABLET | Freq: Every day | ORAL | Status: DC
  Filled 2012-02-25: qty 1

## 2012-02-25 MED ORDER — HYDROMORPHONE HCL PF 1 MG/ML IJ SOLN
2.0000 mg | INTRAMUSCULAR | Status: DC | PRN
  Administered 2012-02-25 – 2012-02-26 (×9): 2 mg via INTRAVENOUS
  Administered 2012-02-27: 1 mg via INTRAVENOUS
  Administered 2012-02-27 (×3): 2 mg via INTRAVENOUS
  Filled 2012-02-25 (×8): qty 2
  Filled 2012-02-25: qty 1
  Filled 2012-02-25 (×3): qty 2

## 2012-02-25 MED ORDER — BUPIVACAINE-EPINEPHRINE (PF) 0.5% -1:200000 IJ SOLN
INTRAMUSCULAR | Status: AC
  Filled 2012-02-25: qty 10

## 2012-02-25 MED ORDER — ONDANSETRON HCL 4 MG/2ML IJ SOLN
INTRAMUSCULAR | Status: DC | PRN
  Administered 2012-02-25 (×2): 1 mg via INTRAVENOUS
  Administered 2012-02-25: 2 mg via INTRAVENOUS

## 2012-02-25 MED ORDER — POTASSIUM CHLORIDE IN NACL 20-0.9 MEQ/L-% IV SOLN
INTRAVENOUS | Status: AC
  Filled 2012-02-25: qty 1000

## 2012-02-25 MED ORDER — HEPARIN SODIUM (PORCINE) 5000 UNIT/ML IJ SOLN
5000.0000 [IU] | Freq: Three times a day (TID) | INTRAMUSCULAR | Status: DC
  Administered 2012-02-26 – 2012-03-01 (×12): 5000 [IU] via SUBCUTANEOUS
  Filled 2012-02-25 (×16): qty 1

## 2012-02-25 MED ORDER — HYDROMORPHONE HCL PF 1 MG/ML IJ SOLN
INTRAMUSCULAR | Status: AC
  Administered 2012-02-25: 2 mg via INTRAVENOUS
  Filled 2012-02-25: qty 2

## 2012-02-25 MED ORDER — CISATRACURIUM BESYLATE 2 MG/ML IV SOLN
INTRAVENOUS | Status: DC | PRN
  Administered 2012-02-25: 2 mg via INTRAVENOUS
  Administered 2012-02-25: 8 mg via INTRAVENOUS
  Administered 2012-02-25: 2 mg via INTRAVENOUS
  Administered 2012-02-25: 4 mg via INTRAVENOUS

## 2012-02-25 MED ORDER — SODIUM CHLORIDE 0.9 % IV SOLN
1.0000 g | INTRAVENOUS | Status: AC
  Administered 2012-02-26: 1 g via INTRAVENOUS
  Filled 2012-02-25: qty 1

## 2012-02-25 MED ORDER — 0.9 % SODIUM CHLORIDE (POUR BTL) OPTIME
TOPICAL | Status: DC | PRN
  Administered 2012-02-25: 2000 mL

## 2012-02-25 MED ORDER — FENTANYL CITRATE 0.05 MG/ML IJ SOLN
25.0000 ug | INTRAMUSCULAR | Status: DC | PRN
  Administered 2012-02-25 (×2): 50 ug via INTRAVENOUS

## 2012-02-25 MED ORDER — GLYCOPYRROLATE 0.2 MG/ML IJ SOLN
INTRAMUSCULAR | Status: DC | PRN
  Administered 2012-02-25: 0.6 mg via INTRAVENOUS

## 2012-02-25 SURGICAL SUPPLY — 70 items
APPLIER CLIP 5 13 M/L LIGAMAX5 (MISCELLANEOUS)
APPLIER CLIP ROT 10 11.4 M/L (STAPLE)
APR CLP MED LRG 11.4X10 (STAPLE)
APR CLP MED LRG 5 ANG JAW (MISCELLANEOUS)
BLADE EXTENDED COATED 6.5IN (ELECTRODE) ×2 IMPLANT
BLADE HEX COATED 2.75 (ELECTRODE) ×2 IMPLANT
BLADE SURG SZ10 CARB STEEL (BLADE) ×2 IMPLANT
CANISTER SUCTION 2500CC (MISCELLANEOUS) ×2 IMPLANT
CANNULA ENDOPATH XCEL 11M (ENDOMECHANICALS) ×1 IMPLANT
CELLS DAT CNTRL 66122 CELL SVR (MISCELLANEOUS) ×1 IMPLANT
CLAMP ENDO BABCK 10MM (STAPLE) ×1 IMPLANT
CLIP APPLIE 5 13 M/L LIGAMAX5 (MISCELLANEOUS) ×1 IMPLANT
CLIP APPLIE ROT 10 11.4 M/L (STAPLE) ×1 IMPLANT
CLOTH BEACON ORANGE TIMEOUT ST (SAFETY) ×2 IMPLANT
COVER MAYO STAND STRL (DRAPES) ×2 IMPLANT
DECANTER SPIKE VIAL GLASS SM (MISCELLANEOUS) ×1 IMPLANT
DEVICE TROCAR PUNCTURE CLOSURE (ENDOMECHANICALS) ×1 IMPLANT
DRAPE LAPAROSCOPIC ABDOMINAL (DRAPES) ×2 IMPLANT
DRAPE LG THREE QUARTER DISP (DRAPES) ×1 IMPLANT
DRAPE POUCH INSTRU U-SHP 10X18 (DRAPES) ×1 IMPLANT
DRAPE WARM FLUID 44X44 (DRAPE) ×2 IMPLANT
ELECT REM PT RETURN 9FT ADLT (ELECTROSURGICAL) ×2
ELECTRODE REM PT RTRN 9FT ADLT (ELECTROSURGICAL) ×1 IMPLANT
ENSEAL DEVICE STD TIP 35CM (ENDOMECHANICALS) ×1 IMPLANT
FILTER SMOKE EVAC LAPAROSHD (FILTER) ×1 IMPLANT
GLOVE BIOGEL PI IND STRL 7.0 (GLOVE) ×1 IMPLANT
GLOVE BIOGEL PI INDICATOR 7.0 (GLOVE)
GLOVE EUDERMIC 7 POWDERFREE (GLOVE) ×5 IMPLANT
GOWN STRL NON-REIN LRG LVL3 (GOWN DISPOSABLE) ×2 IMPLANT
GOWN STRL REIN XL XLG (GOWN DISPOSABLE) ×6 IMPLANT
GRASPER LAPSCPC 5X35 EPIX (ENDOMECHANICALS) ×2 IMPLANT
HAND ACTIVATED (MISCELLANEOUS) ×2 IMPLANT
KIT BASIN OR (CUSTOM PROCEDURE TRAY) ×2 IMPLANT
LEGGING LITHOTOMY PAIR STRL (DRAPES) ×1 IMPLANT
LIGASURE IMPACT 36 18CM CVD LR (INSTRUMENTS) ×2 IMPLANT
NS IRRIG 1000ML POUR BTL (IV SOLUTION) ×3 IMPLANT
PENCIL BUTTON HOLSTER BLD 10FT (ELECTRODE) ×2 IMPLANT
RELOAD PROXIMATE 75MM BLUE (ENDOMECHANICALS) ×4 IMPLANT
RELOAD STAPLE 75 3.8 BLU REG (ENDOMECHANICALS) IMPLANT
RETRACTOR WND ALEXIS 18 MED (MISCELLANEOUS) ×1 IMPLANT
RTRCTR WOUND ALEXIS 18CM MED (MISCELLANEOUS) ×2
SCISSORS LAP 5X35 DISP (ENDOMECHANICALS) ×1 IMPLANT
SET IRRIG TUBING LAPAROSCOPIC (IRRIGATION / IRRIGATOR) ×2 IMPLANT
SOLUTION ANTI FOG 6CC (MISCELLANEOUS) ×2 IMPLANT
SPONGE GAUZE 4X4 12PLY (GAUZE/BANDAGES/DRESSINGS) ×1 IMPLANT
SPONGE LAP 18X18 X RAY DECT (DISPOSABLE) ×4 IMPLANT
STAPLER GUN LINEAR PROX 60 (STAPLE) ×1 IMPLANT
STAPLER PROXIMATE 75MM BLUE (STAPLE) ×1 IMPLANT
STAPLER VISISTAT 35W (STAPLE) ×2 IMPLANT
STRIP CLOSURE SKIN 1/2X4 (GAUZE/BANDAGES/DRESSINGS) ×1 IMPLANT
SUCTION POOLE TIP (SUCTIONS) ×2 IMPLANT
SUT PDS AB 1 CT1 27 (SUTURE) ×2 IMPLANT
SUT PDS AB 1 TP1 96 (SUTURE) ×2 IMPLANT
SUT SILK 2 0 (SUTURE) ×2
SUT SILK 2 0 SH CR/8 (SUTURE) ×2 IMPLANT
SUT SILK 2-0 18XBRD TIE 12 (SUTURE) ×1 IMPLANT
SUT SILK 3 0 (SUTURE) ×2
SUT SILK 3 0 SH CR/8 (SUTURE) ×3 IMPLANT
SUT SILK 3-0 18XBRD TIE 12 (SUTURE) ×1 IMPLANT
TAPE CLOTH SURG 4X10 WHT LF (GAUZE/BANDAGES/DRESSINGS) ×1 IMPLANT
TOWEL OR 17X26 10 PK STRL BLUE (TOWEL DISPOSABLE) ×4 IMPLANT
TRAY FOLEY CATH 14FRSI W/METER (CATHETERS) ×2 IMPLANT
TRAY LAP CHOLE (CUSTOM PROCEDURE TRAY) ×2 IMPLANT
TROCAR BLADELESS OPT 5 75 (ENDOMECHANICALS) ×5 IMPLANT
TROCAR XCEL BLUNT TIP 100MML (ENDOMECHANICALS) ×2 IMPLANT
TROCAR XCEL NON-BLD 11X100MML (ENDOMECHANICALS) ×1 IMPLANT
TUBING INSUFFLATION 10FT LAP (TUBING) ×2 IMPLANT
WATER STERILE IRR 1500ML POUR (IV SOLUTION) ×1 IMPLANT
YANKAUER SUCT BULB TIP 10FT TU (MISCELLANEOUS) ×2 IMPLANT
YANKAUER SUCT BULB TIP NO VENT (SUCTIONS) ×2 IMPLANT

## 2012-02-25 NOTE — Anesthesia Preprocedure Evaluation (Addendum)
Anesthesia Evaluation  Patient identified by MRN, date of birth, ID band Patient awake    Reviewed: Allergy & Precautions, H&P , NPO status , Patient's Chart, lab work & pertinent test results, reviewed documented beta blocker date and time   Airway Mallampati: II TM Distance: >3 FB Neck ROM: Full    Dental  (+) Teeth Intact and Dental Advisory Given   Pulmonary neg pulmonary ROS,  breath sounds clear to auscultation  Pulmonary exam normal       Cardiovascular hypertension, Pt. on home beta blockers and Pt. on medications negative cardio ROS  + Valvular Problems/Murmurs Rhythm:Regular Rate:Normal     Neuro/Psych negative neurological ROS  negative psych ROS   GI/Hepatic negative GI ROS, Neg liver ROS, GERD-  Medicated,  Endo/Other  Morbid obesity  Renal/GU negative Renal ROS  negative genitourinary   Musculoskeletal negative musculoskeletal ROS (+)   Abdominal (+) + obese,   Peds  Hematology negative hematology ROS (+)   Anesthesia Other Findings   Reproductive/Obstetrics negative OB ROS                          Anesthesia Physical Anesthesia Plan  ASA: II  Anesthesia Plan: General   Post-op Pain Management:    Induction: Intravenous  Airway Management Planned: Oral ETT  Additional Equipment:   Intra-op Plan:   Post-operative Plan: Extubation in OR  Informed Consent: I have reviewed the patients History and Physical, chart, labs and discussed the procedure including the risks, benefits and alternatives for the proposed anesthesia with the patient or authorized representative who has indicated his/her understanding and acceptance.   Dental advisory given  Plan Discussed with: CRNA  Anesthesia Plan Comments:         Anesthesia Quick Evaluation

## 2012-02-25 NOTE — Op Note (Signed)
Patient Name:           Daniel [name]   Date of Surgery:        02/25/2012  Pre op Diagnosis:      Intestinal mass of the cecum and base of appendix.  Post op Diagnosis:    same  Procedure:                 Laparoscopic-assisted right colectomy  Surgeon:                     Angelia Mould. Derrell Lolling, M.D., FACS  Assistant:                      Cyndia Bent, M.D., FACS  Operative Indications:   Daniel [name] is a 51 y.o. male. He is referred by Dr. Stan Head for evaluation and management of a mass of the appendix or terminal ileum.  The patient is a fairly healthy 51 year old gentleman. 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 He does not have any GI symptoms. No weight loss or weight gain. No abdominal pain. No change in his bowel habits. No blood in his stool. He had his first-ever screening colonoscopy on December 6 which shows some benign polyps and also showed a submucosal lesion at the appendiceal cecal orifice, bulging and somewhat firm. Biopsies did not confirm any cancer. The mucosa  did not really seem to be disrupted. There was also a cecal polyp which was snared.  Subsequent CT scan shows a soft tissue mass at the base of the appendix approximate 16 mm in size. There is a little bit of infiltration of the fat. There is some mild lymphadenopathy. The radiologist felt that this was suspicious for appendiceal neoplasm such as lymphoma or adenocarcinoma. There may be a little thickening of the terminal ileum. Lab work, including CEA is normal. He was counseled as an outpatient. He has undergone a bowel prep and is brought to the operating room electively for right colectomy.   Operative Findings:       There was a smooth, firm, 2 cm palpable mass at the base of the appendix. I could not tell whether this was actually in the wall of the cecum or the terminal ileum. There was mild palpable adenopathy in the mesentery. There were no other abnormalities. The  liver, gallbladder, stomach, duodenum, sigmoid colon, and peritoneal surfaces looked normal. The omentum looked normal.  Procedure in Detail:          Following the induction of general endotracheal anesthesia, a Foley catheter was inserted. Intravenous antibiotics were given. The abdomen was prepped and draped in a sterile fashion. Surgical time out was performed.  A vertically oriented incision was made in the midline just above the umbilicus. The fascia was incised in the midline and the abdominal cavity entered under direct vision. An 11 mm Hassan trocar was inserted and secured with a pursestring suture of 0 Vicryl. Pneumoperitoneum was created and a videocamera was inserted. I millimeter trochrs were placed in the left mid-abdomen, right mid-abdomen, infraumbilical midline, and mid epigastrium. The patient was positioned. We identified the terminal ileum and ileocecal valve and the appendix. We divided the lateral peritoneal attachments mobilizing the terminal ileum and cecum and right colon extensively from lateral to medial. We identified the ureter in its usual location on the right noticing peristalsis in the ureter. We continued the dissection cephalad and dissected the hepatic flexure off of  the duodenum and the right kidney. We had good visualization of the duodenum. We then repositioned the patient and divided the attachments of the transverse colon from the undersurface of the liver. Great care was taken to avoid injury to the gallbladder. We ultimately had the right half of the transverse colon completely mobilized. At this point we checked that we had good mobility. There was no bleeding. We released the pneumoperitoneum and removed the [employer] trocar. I made an 8 cm incision between the upper 2 trocar sites. The fascia was divided in the midline. Self-retaining retractor and wound protector was placed. The colon was then delivered through the wound protector. We had good mobilization of terminal  ileum, right colon, and hepatic flexure. The terminal ileum was transected with a GIA stapling device about 8 inches proximal to the ileocecal valve. The right transverse colon was divided with a GIA stapling device to the right of the middle colic vessels. Ileocolic vessels were isolated clamped and suture ligated with 2-0 silk ties. Smaller vessels were divided with the LigaSure device. We sent the specimen to the lab. The pathologist looked at this  grossly and elected not to perform a frozen section, desiring to do permanent sections with good evaluation of the depth of penetration, in case this was a malignant lesion. I felt that was appropriate.  The anastomosis was created between the terminal ileum and the mid-ransverse colon with the GIA stapling device. The common defect in the bowel wall was closed with a TA 60 device. We placed a few extra sutures of 3-0 silk to reinforce the staple line a critical points. The mesentery was closed with interrupted sutures of 2-0 silk. We inspected the mesenteric closure and the anastomosis and it looked good.the anastomosis was at least 2.5 cm diameter by palpation. We were very careful to avoid any twisting of the anastomosis and it looked like it lay quite nicely.  We then changed our instruments and gloves. We irrigated out the abdomen and pelvis extensively with multiple liters of saline. There  did not appear to be any bleeding. The midline fascia was closed with a running suture of #1 double stranded PDS. After irrigating the wound we closed the skin with skin staples.  We reinsufflated and reinserted the video camera. We looked around and found some very thin serosanguineous irrigation fluid in the pelvis and up around the liver and removed all of that. The anastomosis looked fine. There was no bleeding anywhere that we could detect. The pneumoperitoneum was released. The trocars were removed. The rest of the skin incisions were closed with skin staples.  Bandages were placed and the patient taken to recovery room in stable condition. Estimated blood loss was about 50 cc. Complications none. Counts correct.     Angelia Mould. Derrell Lolling, M.D., FACS General and Minimally Invasive Surgery Breast and Colorectal Surgery  02/25/2012 11:34 AM

## 2012-02-25 NOTE — Transfer of Care (Signed)
Immediate Anesthesia Transfer of Care Note  Patient: Daniel [name]  Procedure(s) Performed: Procedure(s) (LRB): LAPAROSCOPIC RIGHT COLON RESECTION (Right)  Patient Location: PACU  Anesthesia Type: General  Level of Consciousness: awake, alert  and oriented  Airway & Oxygen Therapy: Patient Spontanous Breathing and Patient connected to face mask oxygen  Post-op Assessment: Report given to PACU RN and Post -op Vital signs reviewed and stable  Post vital signs: Reviewed and stable  Complications: No apparent anesthesia complications

## 2012-02-25 NOTE — Anesthesia Postprocedure Evaluation (Signed)
Anesthesia Post Note  Patient: Daniel [name]  Procedure(s) Performed: Procedure(s) (LRB): LAPAROSCOPIC RIGHT COLON RESECTION (Right)  Anesthesia type: General  Patient location: PACU  Post pain: Pain level controlled  Post assessment: Post-op Vital signs reviewed  Last Vitals:  Filed Vitals:   02/25/12 1215  BP: 144/67  Pulse: 64  Temp:   Resp: 16    Post vital signs: Reviewed  Level of consciousness: sedated  Complications: No apparent anesthesia complications

## 2012-02-25 NOTE — Interval H&P Note (Signed)
History and Physical Interval Note:  02/25/2012 8:39 AM  Daniel [name]  has presented today for surgery, with the diagnosis of right colon mass  The goals of treatment and the various methods of treatment have been discussed with the patient and family. After consideration of risks, benefits and other options for treatment, the patient has consented to  Procedure(s) (LRB): LAPAROSCOPIC RIGHT COLON RESECTION, POSSIBLE OPEN  (Right) as a surgical intervention .  The patients' history has been reviewed today, the patient examined today , no change in status, stable for surgery.  I have reviewed the patients' chart and labs.  Questions were answered to the patient's satisfaction.     Ernestene Mention

## 2012-02-25 NOTE — Plan of Care (Signed)
Problem: Diagnosis - Type of Surgery Goal: General Surgical Patient Education (See Patient Education module for education specifics) Outcome: Progressing progressing  Problem: Phase I Progression Outcomes Goal: Pain controlled with appropriate interventions Outcome: Progressing given Goal: Incision/dressings dry and intact Outcome: Progressing In progress Goal: Tubes/drains patent Outcome: Progressing Progress

## 2012-02-26 LAB — CBC
Hemoglobin: 13.3 g/dL (ref 13.0–17.0)
MCH: 27.8 pg (ref 26.0–34.0)
MCV: 80.2 fL (ref 78.0–100.0)
RBC: 4.79 MIL/uL (ref 4.22–5.81)

## 2012-02-26 LAB — BASIC METABOLIC PANEL
CO2: 25 mEq/L (ref 19–32)
Chloride: 101 mEq/L (ref 96–112)
Glucose, Bld: 123 mg/dL — ABNORMAL HIGH (ref 70–99)
Potassium: 3.9 mEq/L (ref 3.5–5.1)
Sodium: 134 mEq/L — ABNORMAL LOW (ref 135–145)

## 2012-02-26 MED ORDER — DIPHENHYDRAMINE HCL 50 MG/ML IJ SOLN
INTRAMUSCULAR | Status: AC
  Administered 2012-02-26: 25 mg via INTRAVENOUS
  Filled 2012-02-26: qty 1

## 2012-02-26 MED ORDER — DIPHENHYDRAMINE HCL 50 MG/ML IJ SOLN
25.0000 mg | Freq: Four times a day (QID) | INTRAMUSCULAR | Status: DC | PRN
  Administered 2012-02-26: 25 mg via INTRAVENOUS

## 2012-02-26 NOTE — Progress Notes (Signed)
1 Day Post-Op  Subjective: Patient alert. Denies nausea or vomiting. Complaining of incisional pain. Tolerating ice chips. Urine output excellent. As dangled at bedside.  Objective: Vital signs in last 24 hours: Temp:  [96.4 F (35.8 C)-98.1 F (36.7 C)] 98.1 F (36.7 C) (03/13 1000) Pulse Rate:  [64-90] 77  (03/13 1000) Resp:  [12-18] 18  (03/13 1000) BP: (136-176)/(59-80) 151/77 mmHg (03/13 1000) SpO2:  [97 %-100 %] 100 % (03/13 1000) Weight:  [253 lb (114.76 kg)] 253 lb (114.76 kg) (03/12 1335)    Intake/Output from previous day: 03/12 0701 - 03/13 0700 In: 4400 [I.V.:4400] Out: 2750 [Urine:2750] Intake/Output this shift: Total I/O In: -  Out: 420 [Urine:420]  General appearance: alert Resp: clear to auscultation bilaterally GI: abdomen soft. Wounds look normal. No bleeding. Not distended. No bowel sounds.  Lab Results:  Results for orders placed during the hospital encounter of 02/25/12 (from the past 24 hour(s))  CBC     Status: Abnormal   Collection Time   02/25/12  1:40 PM      Component Value Range   WBC 12.1 (*) 4.0 - 10.5 (K/uL)   RBC 4.91  4.22 - 5.81 (MIL/uL)   Hemoglobin 13.1  13.0 - 17.0 (g/dL)   HCT 16.1  09.6 - 04.5 (%)   MCV 80.2  78.0 - 100.0 (fL)   MCH 26.7  26.0 - 34.0 (pg)   MCHC 33.2  30.0 - 36.0 (g/dL)   RDW 40.9  81.1 - 91.4 (%)   Platelets 207  150 - 400 (K/uL)  CREATININE, SERUM     Status: Abnormal   Collection Time   02/25/12  1:40 PM      Component Value Range   Creatinine, Ser 1.04  0.50 - 1.35 (mg/dL)   GFR calc non Af Amer 82 (*) >90 (mL/min)   GFR calc Af Amer >90  >90 (mL/min)  BASIC METABOLIC PANEL     Status: Abnormal   Collection Time   02/26/12  4:55 AM      Component Value Range   Sodium 134 (*) 135 - 145 (mEq/L)   Potassium 3.9  3.5 - 5.1 (mEq/L)   Chloride 101  96 - 112 (mEq/L)   CO2 25  19 - 32 (mEq/L)   Glucose, Bld 123 (*) 70 - 99 (mg/dL)   BUN 11  6 - 23 (mg/dL)   Creatinine, Ser 7.82  0.50 - 1.35 (mg/dL)   Calcium 8.8  8.4 - 95.6 (mg/dL)   GFR calc non Af Amer >90  >90 (mL/min)   GFR calc Af Amer >90  >90 (mL/min)  CBC     Status: Abnormal   Collection Time   02/26/12  4:55 AM      Component Value Range   WBC 16.8 (*) 4.0 - 10.5 (K/uL)   RBC 4.79  4.22 - 5.81 (MIL/uL)   Hemoglobin 13.3  13.0 - 17.0 (g/dL)   HCT 21.3 (*) 08.6 - 52.0 (%)   MCV 80.2  78.0 - 100.0 (fL)   MCH 27.8  26.0 - 34.0 (pg)   MCHC 34.6  30.0 - 36.0 (g/dL)   RDW 57.8  46.9 - 62.9 (%)   Platelets 239  150 - 400 (K/uL)     Studies/Results: @RISRSLT24 @     . 0.9 % NaCl with KCl 20 mEq / L      . alvimopan      . alvimopan  12 mg Oral BID  . amLODipine  10 mg  Oral Daily  . carvedilol  3.125 mg Oral BID AC  . ertapenem (INVANZ) IV  1 g Intravenous Q24H  . fentaNYL      . heparin      . heparin  5,000 Units Subcutaneous Q8H  . losartan  75 mg Oral Daily  . DISCONTD: ertapenem  1 g Intravenous 60 min Pre-Op  . DISCONTD: losartan  75 mg Oral Daily     Assessment/Plan: s/p Procedure(s): LAPAROSCOPIC RIGHT COLON RESECTION   POD #1 stable Begin clear liquids. Remove Foley tomorrow. Mobilize out of bed. Operative findings discussed with patient. VTE prophylaxis with heparin to start today.    LOS: 1 day    Tali Cleaves M 02/26/2012  . .prob

## 2012-02-27 MED ORDER — BISACODYL 10 MG RE SUPP
10.0000 mg | Freq: Once | RECTAL | Status: AC
  Administered 2012-02-27: 10 mg via RECTAL
  Filled 2012-02-27: qty 1

## 2012-02-27 NOTE — Progress Notes (Signed)
2 Days Post-Op  Subjective: Feels better. No nausea. Tolerating clear liquids. No stool or flatus. Foley catheter just removed. Urine output remains high. Blood pressure is borderline elevated. Has resumed antihypertensive medications.  Final pathology report shows tubular adenoma, no dysplasia or malignancy. Lymph nodes negative. The patient was informed of this and we discussed this. Followup colonoscopy in one year recommended.  Objective: Vital signs in last 24 hours: Temp:  [98.1 F (36.7 C)-99.7 F (37.6 C)] 99.7 F (37.6 C) (03/14 0608) Pulse Rate:  [72-91] 90  (03/14 0608) Resp:  [17-18] 18  (03/14 0608) BP: (144-163)/(77-95) 144/86 mmHg (03/14 0608) SpO2:  [96 %-100 %] 99 % (03/14 0608)    Intake/Output from previous day: 03/13 0701 - 03/14 0700 In: -  Out: 3340 [Urine:3340] Intake/Output this shift: Total I/O In: -  Out: 2600 [Urine:2600]  General appearance: alert Resp: clear to auscultation bilaterally GI: abdomen is soft and nondistended. Minimal bowel sounds. Wound is clean. No active bleeding.  Lab Results:  No results found for this or any previous visit (from the past 24 hour(s)).   Studies/Results: @RISRSLT24 @     . alvimopan  12 mg Oral BID  . amLODipine  10 mg Oral Daily  . carvedilol  3.125 mg Oral BID AC  . ertapenem (INVANZ) IV  1 g Intravenous Q24H  . heparin  5,000 Units Subcutaneous Q8H  . losartan  75 mg Oral Daily     Assessment/Plan: s/p Procedure(s): LAPAROSCOPIC RIGHT COLON RESECTION For Tubular Adenoma   POD #2. Stable Await resolution of expected ileus Advance to full liquids dulcolax suppository. p.o.  Analgesics. Increase activity.    LOS: 2 days    Zaara Sprowl M. Derrell Lolling, M.D., [facility] General and Minimally invasive Surgery Breast and Colorectal Surgery Office:   [phone] Pager:   [phone]  02/27/2012  . .prob

## 2012-02-28 MED ORDER — BISACODYL 10 MG RE SUPP
10.0000 mg | Freq: Two times a day (BID) | RECTAL | Status: AC
  Administered 2012-02-28 (×2): 10 mg via RECTAL
  Filled 2012-02-28 (×2): qty 1

## 2012-02-28 NOTE — Progress Notes (Signed)
3 Days Post-Op  Subjective: Stable and alert. Voiding without difficulty. Ambulating in halls several times a day. No flatus or stool yet. Feels a little pressure in the lower abdomen.  Vital signs looked good. No fever or tachycardia. Borderline diastolic hypertension. We have resumed his antihypertensive medications and reduced his IV rate.  Objective: Vital signs in last 24 hours: Temp:  [97 F (36.1 C)-99.7 F (37.6 C)] 97 F (36.1 C) (03/14 2200) Pulse Rate:  [74-90] 78  (03/14 2200) Resp:  [18-19] 19  (03/14 2200) BP: (144-168)/(86-98) 146/89 mmHg (03/14 2200) SpO2:  [97 %-100 %] 100 % (03/14 2200)    Intake/Output from previous day: 03/14 0701 - 03/15 0700 In: 620 [P.O.:620] Out: 1000 [Urine:1000] Intake/Output this shift: Total I/O In: -  Out: 500 [Urine:500]  General appearance: patient floor. In no distress. Resp: clear to auscultation bilaterally GI: abdomen is soft, nondistended, minimally tender. Wound looks fine. Occasional bowel sounds.  Lab Results:  No results found for this or any previous visit (from the past 24 hour(s)).   Studies/Results: @RISRSLT24 @     . alvimopan  12 mg Oral BID  . amLODipine  10 mg Oral Daily  . bisacodyl  10 mg Rectal Once  . carvedilol  3.125 mg Oral BID AC  . heparin  5,000 Units Subcutaneous Q8H  . losartan  75 mg Oral Daily     Assessment/Plan: s/p Procedure(s): LAPAROSCOPIC RIGHT COLON RESECTION  Stable. Await resolution of ileus. Continue full liquids, dulcolaxsuppository, prune-juice. Home when GI function normalizes.  Hypertension. Continue to monitor.    LOS: 3 days    Shaquella Stamant M 02/28/2012  . .prob

## 2012-02-29 NOTE — Progress Notes (Signed)
4 Days Post-Op  Subjective: Passing some flatus and had a couple of stools. Appetite increased. No nausea. Feels better.  Objective: Vital signs in last 24 hours: Temp:  [97.8 F (36.6 C)-99.2 F (37.3 C)] 98.2 F (36.8 C) (03/16 0622) Pulse Rate:  [61-83] 68  (03/16 0622) Resp:  [18] 18  (03/16 0622) BP: (142-149)/(78-87) 149/83 mmHg (03/16 0622) SpO2:  [90 %-95 %] 95 % (03/16 0622) Last BM Date: 02/28/12  Intake/Output from previous day: 03/15 0701 - 03/16 0700 In: 1473.3 [P.O.:480; I.V.:993.3] Out: 900 [Urine:900] Intake/Output this shift:    General appearance: alert GI: abdomen is soft, flat, nondistended, nontender. Wounds looked fine.  Lab Results:  No results found for this or any previous visit (from the past 24 hour(s)).   Studies/Results: @RISRSLT24 @     . alvimopan  12 mg Oral BID  . amLODipine  10 mg Oral Daily  . bisacodyl  10 mg Rectal BID  . carvedilol  3.125 mg Oral BID AC  . heparin  5,000 Units Subcutaneous Q8H  . losartan  75 mg Oral Daily     Assessment/Plan: s/p Procedure(s): LAPAROSCOPIC RIGHT COLON RESECTION  Ileus resolving. Advanced to low-fat diet. IV at [facility]. Hopefully discharge home tomorrow.    LOS: 4 days    Toba Claudio M. Derrell Lolling, M.D., [facility] General and Minimally invasive Surgery Breast and Colorectal Surgery Office:   [phone] Pager:   [phone]  02/29/2012  . .prob

## 2012-02-29 NOTE — Progress Notes (Signed)
Dr. Derrell Lolling aware via phone IV due to be changed today. Requested to dc. Plan to dc home in am. Pt status reported ie, pt walking, tolerating diet and po fluids, voiding wnl, & bm's x2 today. See new order received and entered into EPIC.

## 2012-03-01 ENCOUNTER — Encounter: Payer: Self-pay | Admitting: Family Medicine

## 2012-03-01 ENCOUNTER — Encounter: Payer: Self-pay | Admitting: Internal Medicine

## 2012-03-01 MED ORDER — HYDROCODONE-ACETAMINOPHEN 5-325 MG PO TABS: 1.0000 | ORAL_TABLET | ORAL | Status: AC | PRN

## 2012-03-01 NOTE — Discharge Summary (Signed)
Patient ID: Daniel [name] [id number] 51 y.o. 1960-12-22  02/25/2012  Discharge date and time: March 01, 2012  Admitting Physician: Ernestene Mention  Discharge Physician: Ernestene Mention  Admission Diagnoses: right colon mass  Discharge Diagnoses: Tubular adenoma of cecum  Operations: Procedure(s): LAPAROSCOPIC RIGHT COLON RESECTION  Admission Condition: good  Discharged Condition: good  Indication for Admission: The patient is a fairly healthy 51 year old gentleman.  He does not have any GI symptoms. No weight loss or weight gain. No abdominal pain. No change in his bowel habits. No blood in his stool. He had his first-ever screening colonoscopy on December 6 which shows some benign polyps and also showed a submucosal lesion at the appendiceal cecal orifice, bulging and somewhat firm. Biopsies did not confirm any cancer. The mucosa  did not really seem to be disrupted. There was also a cecal polyp which was snared.  Subsequent CT scan shows a soft tissue mass at the base of the appendix approximate 16 mm in size. There is a little bit of infiltration of the fat. There is some lymphadenopathy. The radiologist felt that this was suspicious for appendiceal neoplasm such as lymphoma or adenocarcinoma. There may be a little thickening of the terminal ileum. The patient was evaluated by me and by Dr. Faith Rogue as an outpatient. He is brought to the operating room electively for right colon resection following elective bowel prep at home.   Hospital Course: on the day of admission the patient was taken to the operating room and underwent a laparoscopic right colectomy. The surgery was uneventful. The final pathology report showed a benign tubular adenoma, 4 cm in diameter. 12 lymph nodes were examined and they were also negative. The patient was informed of his diagnosis and given a copy of the pathology report.  Postoperatively the patient did fairly well. He had an ileus for a couple  of days and then advanced in his diet and activities without any problem. He was discharged on postop day #5. At that time he was tolerating a regular diet and had begun to have bowel movements. He felt well and wanted to go home. His abdomen was nontender and his incision looked good.  He was instructed to return to see me in the office in one week for staple removal. He was given a prescription for Vicodin for pain. Diet and activities were discussed.  Consults: None  Significant Diagnostic Studies: histopathology  Treatments: surgery:   Disposition: Home  Patient Instructions:   Norton, Bivins  Home Medication Instructions [id number]   Printed on:03/01/12 0938  Medication Information                    carvedilol (COREG) 3.125 MG tablet Take 1 tablet (3.125 mg total) by mouth 2 (two) times daily.           Multiple Vitamin (MULITIVITAMIN WITH MINERALS) TABS Take 1 tablet by mouth every morning.           aspirin 81 MG EC tablet Take 81 mg by mouth every morning.           pravastatin (PRAVACHOL) 40 MG tablet Take 40 mg by mouth every evening.           losartan (COZAAR) 50 MG tablet Take 75 mg by mouth every morning.           amLODipine (NORVASC) 10 MG tablet Take 10 mg by mouth every morning.  Activity: no lifting, driving, or strenuous exercise for 3 weeks Diet: low fat, low cholesterol diet Wound Care: as directed  Follow-up:  With Dr. Derrell Lolling in 1 week.  Signed: Angelia Mould. Derrell Lolling, M.D., FACS General and minimally invasive surgery Breast and Colorectal Surgery  03/01/2012, 9:38 AM

## 2012-03-01 NOTE — Progress Notes (Signed)
5 Days Post-Op  Subjective: Doing well. Having bowel movements.  Objective: Vital signs in last 24 hours: Temp:  [98.2 F (36.8 C)-99.2 F (37.3 C)] 98.2 F (36.8 C) (03/17 0539) Pulse Rate:  [62-67] 67  (03/17 0539) Resp:  [18] 18  (03/17 0539) BP: (120-143)/(68-87) 132/68 mmHg (03/17 0539) SpO2:  [97 %-99 %] 99 % (03/17 0539) Last BM Date: 02/29/12  Intake/Output from previous day: 03/16 0701 - 03/17 0700 In: 1106.3 [P.O.:780; I.V.:326.3] Out: 250 [Urine:250] Intake/Output this shift:    General appearance: alert. Appears comfortable. GI: abdomen is soft, nontender, nondistended. Wound clean.  Lab Results:  No results found for this or any previous visit (from the past 24 hour(s)).   Studies/Results: @RISRSLT24 @     . alvimopan  12 mg Oral BID  . amLODipine  10 mg Oral Daily  . carvedilol  3.125 mg Oral BID AC  . heparin  5,000 Units Subcutaneous Q8H  . losartan  75 mg Oral Daily     Assessment/Plan: s/p Procedure(s): LAPAROSCOPIC RIGHT COLON RESECTION  Discharge home today. Prescription for Vicodin given. Diet and activities discussed. Return to office in 1 week for staple removal. The patient told he needs colonoscopy in one year.      LOS: 5 days    Mahreen Schewe M. Derrell Lolling, M.D., [facility] General and Minimally invasive Surgery Breast and Colorectal Surgery Office:   [phone] Pager:   [phone]  03/01/2012  . .prob

## 2012-03-01 NOTE — Progress Notes (Signed)
Assessment unchanged. Pt and wife verbalized understanding of dc instructions. Script x1 given as provided by MD. Pt discharged via wheelchair to front entrance to meet awaiting vehicle to carry home. Accompanied by wife and nurse tech.

## 2012-03-01 NOTE — Progress Notes (Signed)
Will plan for routine repeat colonoscopy in/around 11/2012

## 2012-03-11 ENCOUNTER — Encounter (HOSPITAL_COMMUNITY): Payer: Self-pay | Admitting: General Surgery

## 2012-03-12 ENCOUNTER — Ambulatory Visit (INDEPENDENT_AMBULATORY_CARE_PROVIDER_SITE_OTHER): Payer: 59 | Admitting: General Surgery

## 2012-03-12 ENCOUNTER — Encounter (INDEPENDENT_AMBULATORY_CARE_PROVIDER_SITE_OTHER): Payer: Self-pay | Admitting: General Surgery

## 2012-03-12 VITALS — BP 142/84 | HR 70 | Temp 97.8°F | Resp 18 | Ht 73.0 in | Wt 243.2 lb

## 2012-03-12 DIAGNOSIS — D374 Neoplasm of uncertain behavior of colon: Secondary | ICD-10-CM

## 2012-03-12 DIAGNOSIS — D371 Neoplasm of uncertain behavior of stomach: Secondary | ICD-10-CM

## 2012-03-12 DIAGNOSIS — D378 Neoplasm of uncertain behavior of other specified digestive organs: Secondary | ICD-10-CM

## 2012-03-12 HISTORY — DX: Neoplasm of uncertain behavior of colon: D37.4

## 2012-03-12 NOTE — Patient Instructions (Signed)
Your incision is healing without any obvious problem.  You have been given a copy of the pathology report which shows that the large growth in your colon was  noncancerous.  You may return to work on April 1 under limited activities. 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  No sports or lifting more than 20 pounds until after April 15.  Return to see Dr. Derrell Lolling in 6 weeks for a postop check.

## 2012-03-12 NOTE — Progress Notes (Signed)
Subjective:     Patient ID: Daniel [name], male   DOB: [birth year], 51 y.o.   MRN: [id number]  HPI This gentleman underwent a laparoscopic right colectomy on February 25, 2012. Final pathology report showed a villous adenoma, 4 cm diameter. This was described as being in the cecum. Lymph nodes were negative. No evidence of malignancy.  He has been given a copy of the pathology report.  He is doing well. Appetite normal. Bowel movements have returned to normal. No wound problems. No pain. He wants to go back to work.  Review of Systems     Objective:   Physical Exam Patient looks well. He is in no distress.  Abdomen soft flat nontender. All the wounds are healing well. Staples are removed.    Assessment:     Villous adenoma of the cecum, recovering uneventfully following laparoscopic right colectomy.    Plan:     He may return to work April 1, but with limited activities. 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  Return to see me in 6 weeks to make sure that wound healing has progressed normally.  He will need a colonoscopy in one year.   Angelia Mould. Derrell Lolling, M.D., [facility] General and Minimally invasive Surgery Breast and Colorectal Surgery Office:   [phone] Pager:   [phone]

## 2012-04-24 ENCOUNTER — Encounter (INDEPENDENT_AMBULATORY_CARE_PROVIDER_SITE_OTHER): Payer: 59 | Admitting: General Surgery

## 2012-06-08 ENCOUNTER — Encounter (INDEPENDENT_AMBULATORY_CARE_PROVIDER_SITE_OTHER): Payer: Self-pay | Admitting: General Surgery

## 2012-06-08 ENCOUNTER — Ambulatory Visit (INDEPENDENT_AMBULATORY_CARE_PROVIDER_SITE_OTHER): Payer: 59 | Admitting: General Surgery

## 2012-06-08 VITALS — BP 160/96 | HR 68 | Temp 98.3°F | Resp 16 | Ht 72.0 in | Wt 262.8 lb

## 2012-06-08 DIAGNOSIS — D374 Neoplasm of uncertain behavior of colon: Secondary | ICD-10-CM

## 2012-06-08 DIAGNOSIS — D378 Neoplasm of uncertain behavior of other specified digestive organs: Secondary | ICD-10-CM

## 2012-06-08 DIAGNOSIS — D371 Neoplasm of uncertain behavior of stomach: Secondary | ICD-10-CM

## 2012-06-08 NOTE — Patient Instructions (Signed)
All of your surgery incisions have completely healed without any obvious complication.  There are no restrictions on your physical activity.  Be sure to get a followup colonoscopy in one year with Dr. Stan Head.  Return to see Dr. Derrell Lolling if any new problems arise.

## 2012-06-08 NOTE — Progress Notes (Signed)
Subjective:     Patient ID: Daniel [name], male   DOB: 08/18/1961, 51 y.o.   MRN: [id number]  HPI  This patient underwent laparoscopic-assisted right colectomy on 02/25/2012.  Final pathology shows benign tubular adenoma, 4 cm diameter no dysplasia, negative nodes.  He is back to work full-time without restriction. He has no complaints. No pain. No wound healing problems Review of Systems     Objective:   Physical Exam The patient looks well. In no distress.  Abdomen soft nontender, incisions well healed no hernia.    Assessment:     A 4 cm tubular adenoma right colon, uneventful recovery following laparoscopic assisted right colectomy    Plan:     Norestriction of activities.  Colonoscopy in one year  See me when necessary.    Angelia Mould. Derrell Lolling, M.D., [facility] General and Minimally invasive Surgery Breast and Colorectal Surgery Office:   [phone] Pager:   [phone]

## 2012-11-27 ENCOUNTER — Other Ambulatory Visit: Payer: Self-pay | Admitting: Internal Medicine

## 2012-11-27 NOTE — Telephone Encounter (Signed)
Patient has not been seen here since 10/18/11.  No upcoming appts scheduled.  Please advise.

## 2012-11-29 NOTE — Telephone Encounter (Signed)
Needs CPE.  rx sent with 1 rf.

## 2012-11-30 NOTE — Telephone Encounter (Signed)
Patient advised.

## 2012-12-31 ENCOUNTER — Other Ambulatory Visit (HOSPITAL_COMMUNITY): Payer: Self-pay | Admitting: Physician Assistant

## 2013-02-03 ENCOUNTER — Other Ambulatory Visit: Payer: Self-pay | Admitting: Family Medicine

## 2013-02-04 NOTE — Telephone Encounter (Signed)
Electronic refill request.  Patient has not been seen since 2012.  Please advise.

## 2013-02-05 NOTE — Telephone Encounter (Signed)
Patient advised.  Will call next week to schedule appt.

## 2013-02-05 NOTE — Telephone Encounter (Signed)
Sent, please schedule CPE.  Thanks.  

## 2013-05-04 ENCOUNTER — Other Ambulatory Visit: Payer: Self-pay | Admitting: Family Medicine

## 2013-05-04 NOTE — Telephone Encounter (Signed)
Electronic refill request. Patient not seen in our office in > 1 year. Please advise.

## 2013-05-04 NOTE — Telephone Encounter (Signed)
Sent, schedule CPE.

## 2013-05-05 NOTE — Telephone Encounter (Signed)
Patient notified by telephone and physical scheduled.

## 2013-05-05 NOTE — Telephone Encounter (Signed)
Left message on voice mail  to call back

## 2013-05-31 ENCOUNTER — Encounter: Payer: Self-pay | Admitting: Internal Medicine

## 2013-07-12 ENCOUNTER — Ambulatory Visit (INDEPENDENT_AMBULATORY_CARE_PROVIDER_SITE_OTHER): Payer: 59 | Admitting: Family Medicine

## 2013-07-12 ENCOUNTER — Encounter: Payer: Self-pay | Admitting: Family Medicine

## 2013-07-12 VITALS — BP 144/86 | HR 73 | Temp 98.4°F | Wt 267.2 lb

## 2013-07-12 DIAGNOSIS — D374 Neoplasm of uncertain behavior of colon: Secondary | ICD-10-CM

## 2013-07-12 DIAGNOSIS — I10 Essential (primary) hypertension: Secondary | ICD-10-CM

## 2013-07-12 DIAGNOSIS — E669 Obesity, unspecified: Secondary | ICD-10-CM

## 2013-07-12 DIAGNOSIS — Z125 Encounter for screening for malignant neoplasm of prostate: Secondary | ICD-10-CM

## 2013-07-12 DIAGNOSIS — K219 Gastro-esophageal reflux disease without esophagitis: Secondary | ICD-10-CM

## 2013-07-12 DIAGNOSIS — E782 Mixed hyperlipidemia: Secondary | ICD-10-CM

## 2013-07-12 DIAGNOSIS — Z0000 Encounter for general adult medical examination without abnormal findings: Secondary | ICD-10-CM

## 2013-07-12 LAB — COMPREHENSIVE METABOLIC PANEL
ALT: 19 U/L (ref 0–53)
AST: 22 U/L (ref 0–37)
Albumin: 4.2 g/dL (ref 3.5–5.2)
Alkaline Phosphatase: 58 U/L (ref 39–117)
Calcium: 9.3 mg/dL (ref 8.4–10.5)
Chloride: 102 mEq/L (ref 96–112)
Creatinine, Ser: 1.1 mg/dL (ref 0.4–1.5)
Potassium: 4.1 mEq/L (ref 3.5–5.1)

## 2013-07-12 LAB — LIPID PANEL
HDL: 42.6 mg/dL (ref >39.00)
Total CHOL/HDL Ratio: 5
Triglycerides: 104 mg/dL (ref 0.0–149.0)

## 2013-07-12 LAB — PSA: PSA: 2.45 ng/mL (ref 0.10–4.00)

## 2013-07-12 MED ORDER — LOSARTAN POTASSIUM 50 MG PO TABS: 75.0000 mg | ORAL_TABLET | ORAL | Status: DC

## 2013-07-12 MED ORDER — AMLODIPINE BESYLATE 10 MG PO TABS: ORAL_TABLET | ORAL | Status: DC

## 2013-07-12 MED ORDER — PRAVASTATIN SODIUM 40 MG PO TABS: ORAL_TABLET | ORAL | Status: DC

## 2013-07-12 MED ORDER — CARVEDILOL 3.125 MG PO TABS: 3.1250 mg | ORAL_TABLET | Freq: Two times a day (BID) | ORAL | Status: DC

## 2013-07-12 NOTE — Assessment & Plan Note (Signed)
Controlled with PPI, he'll work on diet and try to get off PPI.

## 2013-07-12 NOTE — Assessment & Plan Note (Signed)
He'll call about f/u colonoscopy.  Scar on abd well healed.

## 2013-07-12 NOTE — Assessment & Plan Note (Signed)
Controlled, restart/continue current meds (had run out of amlodipine).  See notes on labs.

## 2013-07-12 NOTE — Progress Notes (Signed)
CPE- See plan.  Routine anticipatory guidance given to patient.  See health maintenance. Tetanus 2014- He had a needle stick at work with [employer].  Had f/u testing and tetanus at that point.  Flu shot prev done.   Shingles and PNA shot d/w pt.  He had shingles this year.  Resolved now.   He'll call about the repeat colonoscopy.   PSA d/w pt.  Prostate cancer screening and PSA options (with potential risks and benefits of testing vs not testing) were discussed along with recent recs/guidelines.  He accepted testing PSA at this point. Living will.  Would have his daughter designated if incapacitated.  Diet and exercise.  Getting ready to be better on both, he's going to retire and will work out more. He's going to work on diet when his work schedule improves.   He's looking forward to retirement.  He plans to fish a lot.    Hypertension:    Using medication without problems or lightheadedness: yes Chest pain with exertion:no Edema:no Short of breath:no  Elevated Cholesterol: Using medications without problems:yes Muscle aches: discussed Diet compliance: see above Exercise: see above  PMH and SH reviewed  Meds, vitals, and allergies reviewed.   ROS: See HPI.  Otherwise negative.    GEN: nad, alert and oriented HEENT: mucous membranes moist NECK: supple w/o LA CV: rrr. PULM: ctab, no inc wob ABD: soft, +bs EXT: no edema SKIN: no acute rash

## 2013-07-12 NOTE — Assessment & Plan Note (Signed)
Routine anticipatory guidance given to patient.  See health maintenance. Tetanus 2014- He had a needle stick at work with [employer].  Had f/u testing and tetanus at that point.  Flu shot prev done.   Shingles and PNA shot d/w pt.  He had shingles this year.  Resolved now.   He'll call about the repeat colonoscopy.   PSA d/w pt.  Prostate cancer screening and PSA options (with potential risks and benefits of testing vs not testing) were discussed along with recent recs/guidelines.  He accepted testing PSA at this point. Living will.  Would have his daughter designated if incapacitated.  Diet and exercise.  Getting ready to be better on both, he's going to retire and will work out more. He's going to work on diet when his work schedule improves.   He's looking forward to retirement.  He plans to fish a lot.

## 2013-07-12 NOTE — Assessment & Plan Note (Signed)
D/w pt about weight loss.  

## 2013-07-12 NOTE — Assessment & Plan Note (Signed)
Continue statin, see notes on labs.  

## 2013-07-12 NOTE — Patient Instructions (Signed)
Go to the lab on the way out.  We'll contact you with your lab report. Keep exercising and working on your diet.  Take care.  Glad to see you.  I would get a flu shot each fall.   Call about the colonoscopy.

## 2013-07-13 ENCOUNTER — Encounter: Payer: Self-pay | Admitting: *Deleted

## 2013-09-30 ENCOUNTER — Encounter: Payer: Self-pay | Admitting: Family Medicine

## 2013-09-30 ENCOUNTER — Encounter: Payer: Self-pay | Admitting: Internal Medicine

## 2013-09-30 ENCOUNTER — Ambulatory Visit (INDEPENDENT_AMBULATORY_CARE_PROVIDER_SITE_OTHER): Payer: 59 | Admitting: Family Medicine

## 2013-09-30 VITALS — BP 130/84 | HR 61 | Temp 98.1°F | Wt 260.8 lb

## 2013-09-30 DIAGNOSIS — K219 Gastro-esophageal reflux disease without esophagitis: Secondary | ICD-10-CM

## 2013-09-30 DIAGNOSIS — R0789 Other chest pain: Secondary | ICD-10-CM

## 2013-09-30 MED ORDER — OMEPRAZOLE 20 MG PO CPDR: 40.0000 mg | DELAYED_RELEASE_CAPSULE | Freq: Every day | ORAL | Status: DC

## 2013-09-30 MED ORDER — GI COCKTAIL ~~LOC~~
30.0000 mL | Freq: Once | ORAL | Status: AC
  Administered 2013-09-30: 30 mL via ORAL

## 2013-09-30 NOTE — Patient Instructions (Signed)
Take 2 prilosec a day.  See Shirlee Limerick about your referral on the way out.  Take care.

## 2013-09-30 NOTE — Progress Notes (Signed)
Anterior chest pain. Going on for the last week.  Similar to prev GERD.  Prev did well on PPI, see note from 06/2013.  It can happen right after eating. It can happen at rest or with exertion.  Stopping exertion doesn't help.  Occ change if taste in mouth.  Taking prilosec prev helped, but off now as the effect wore off.  Not SOB usually.  No jaw or L arm pain.  Can happen with food or fasting.  Variable duration and intensity.  Stress test w/o ischemia 12/12.  No blood in stool.  No vomiting.  Occ feeling of food sticking in mid esophagus.   Meds, vitals, and allergies reviewed.   ROS: See HPI.  Otherwise, noncontributory.  GEN: nad, alert and oriented HEENT: mucous membranes moist NECK: supple w/o LA CV: rrr.  PULM: ctab, no inc wob ABD: soft, +bs EXT: no edema SKIN: no acute rash  EKG wnl. Not resolved with GI cocktail.

## 2013-10-01 NOTE — Assessment & Plan Note (Signed)
Likely GERD vs stricture vs hiatal hernia.  I don't think this is cardiac. Would inc PPI for now and have pt see GI given the sensation of dysphagia.  He agrees.

## 2013-11-08 ENCOUNTER — Encounter: Payer: Self-pay | Admitting: Internal Medicine

## 2013-11-08 ENCOUNTER — Ambulatory Visit (INDEPENDENT_AMBULATORY_CARE_PROVIDER_SITE_OTHER): Payer: 59 | Admitting: Internal Medicine

## 2013-11-08 VITALS — BP 154/98 | HR 80 | Ht 72.0 in | Wt 260.2 lb

## 2013-11-08 DIAGNOSIS — R1314 Dysphagia, pharyngoesophageal phase: Secondary | ICD-10-CM

## 2013-11-08 DIAGNOSIS — R1319 Other dysphagia: Secondary | ICD-10-CM

## 2013-11-08 DIAGNOSIS — K219 Gastro-esophageal reflux disease without esophagitis: Secondary | ICD-10-CM

## 2013-11-08 DIAGNOSIS — K222 Esophageal obstruction: Secondary | ICD-10-CM

## 2013-11-08 DIAGNOSIS — R1310 Dysphagia, unspecified: Secondary | ICD-10-CM

## 2013-11-08 HISTORY — DX: Esophageal obstruction: K22.2

## 2013-11-08 MED ORDER — PANTOPRAZOLE SODIUM 40 MG PO TBEC: 40.0000 mg | DELAYED_RELEASE_TABLET | Freq: Every day | ORAL | Status: DC

## 2013-11-08 NOTE — Patient Instructions (Signed)
You have been scheduled for an endoscopy with propofol. Please follow written instructions given to you at your visit today. If you use inhalers (even only as needed), please bring them with you on the day of your procedure. Your physician has requested that you go to www.startemmi.com and enter the access code given to you at your visit today. This web site gives a general overview about your procedure. However, you should still follow specific instructions given to you by our office regarding your preparation for the procedure.   Stop your Prilosec and start Pantoprazole daily.  This has been sent to your pharmacy.  Today you have been given a GERD diet to read and follow.  I appreciate the opportunity to care for you.

## 2013-11-08 NOTE — Progress Notes (Signed)
Subjective:    Patient ID: Daniel [name], male    DOB: 1960-12-19, 52 y.o.   MRN: [id number]  HPI The patient is a middle-aged [place] American man here because of heartburn symptoms, chest pain and dysphagia. This is been coming on, he says ever since he had a right colectomy for adenoma of the cecum in 2013. He has gained weight since then as well. He describes intermittent heartburn symptoms, he was awakening at night with the discomfort in the center the chest. He also has symptoms of solid food dysphagia with a midsternal sticking point at times. Dr. Para March has put him on Prilosec OTC twice daily, actually taking 2 in the morning and then as alleviated things quite a bit but not completely. GI review of systems is otherwise negative. He does not use caffeine  Allergies  Allergen Reactions  . Oxycodone     syncope  . Lisinopril Cough   Outpatient Prescriptions Prior to Visit  Medication Sig Dispense Refill  . amLODipine (NORVASC) 10 MG tablet TAKE 1 TABLET BY MOUTH EVERY DAY FOR BLOOD PRESSURE  90 tablet  3  . aspirin 81 MG tablet Take 81 mg by mouth daily.      . carvedilol (COREG) 3.125 MG tablet Take 1 tablet (3.125 mg total) by mouth 2 (two) times daily.  180 tablet  3  . losartan (COZAAR) 50 MG tablet Take 1.5 tablets (75 mg total) by mouth every morning.  135 tablet  3  . Multiple Vitamin (MULITIVITAMIN WITH MINERALS) TABS Take 1 tablet by mouth every morning.      . pravastatin (PRAVACHOL) 40 MG tablet TAKE 1 TABLET BY MOUTH DAILY.  90 tablet  3  . omeprazole (PRILOSEC) 20 MG capsule Take 2 capsules (40 mg total) by mouth daily.       No facility-administered medications prior to visit.   Past Medical History  Diagnosis Date  . Hyperlipidemia   . Hypertension   . 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 Chest pain     12/12:  Lexiscan Myoview demonstrated no ischemia or scar.  His EF was  41%.; Echocardiogram 01/16/12: LVH, EF 55-65%, diastolic function normal, no wall motion abnormalities, moderate LAE, mild RVE   . Mass of colon   . Heart murmur 02-18-12    as child, not now  . GERD (gastroesophageal reflux disease) 02-18-12    none in 5 months  . Tubular adenoma of colon 11/2011   Past Surgical History  Procedure Laterality Date  . Colonoscopy  11/21/11  . Laparoscopic right colon resection  2013    adenomatous polyp  . Colon resection  02/25/2012    Procedure: LAPAROSCOPIC RIGHT COLON RESECTION;  Surgeon: Ernestene Mention, MD;  Location: [facility];  Service: General;  Laterality: Right;  laparoscopic assisted right colectomy  . Appendectomy  2013   History   Social History  . Marital Status: Divorced    Spouse Name: N/A    Number of Children: 2  . Years of Education: N/A   Occupational History  . [employer]   Social History Main Topics  . Smoking status: Never Smoker   . Smokeless tobacco: Never Used  . Alcohol Use: No  . Drug Use: No  . Sexual Activity: Yes          Social History Narrative   Works for [employer] through 2014  Divorced 20+ years   [employer]   1 son/1 daughter    Review of Systems Positive for those things mentioned in the history of present illness all other review of systems negative.    Objective:   Physical Exam General:  NAD, obese black man Eyes:   Anicteric Mouth: Free of lesions Lungs:  clear Heart:  S1S2 no rubs, murmurs or gallops Abdomen:  soft and nontender, BS+, obese, well-healed surgical scars  Data Reviewed:  Recent primary care notes    Assessment & Plan:   1. Esophageal dysphagia   2. GERD (gastroesophageal reflux disease)

## 2013-11-08 NOTE — Assessment & Plan Note (Signed)
Investigate with upper GI endoscopy with plans to likely dilate the esophagus. This is scheduled for 11/15/2013. The risks and benefits as well as alternatives of endoscopic procedure(s) have been discussed and reviewed. All questions answered. The patient agrees to proceed.

## 2013-11-08 NOTE — Assessment & Plan Note (Signed)
Changed to pantoprazole 40 mg daily. Discontinue omeprazole over-the-counter. Reflux diet and lifestyle changes information given. He says he is trying to lose weight.

## 2013-11-10 ENCOUNTER — Encounter: Payer: Self-pay | Admitting: Internal Medicine

## 2013-11-15 ENCOUNTER — Encounter: Payer: Self-pay | Admitting: Internal Medicine

## 2013-11-15 ENCOUNTER — Ambulatory Visit (AMBULATORY_SURGERY_CENTER): Payer: 59 | Admitting: Internal Medicine

## 2013-11-15 ENCOUNTER — Telehealth: Payer: Self-pay

## 2013-11-15 VITALS — BP 153/95 | HR 69 | Temp 98.8°F | Resp 17 | Ht 72.0 in | Wt 260.0 lb

## 2013-11-15 DIAGNOSIS — K222 Esophageal obstruction: Secondary | ICD-10-CM

## 2013-11-15 DIAGNOSIS — R1314 Dysphagia, pharyngoesophageal phase: Secondary | ICD-10-CM

## 2013-11-15 DIAGNOSIS — D130 Benign neoplasm of esophagus: Secondary | ICD-10-CM

## 2013-11-15 MED ORDER — SODIUM CHLORIDE 0.9 % IV SOLN: 500.0000 mL | INTRAVENOUS | Status: DC

## 2013-11-15 NOTE — Progress Notes (Signed)
Patient did not experience any of the following events: a burn prior to discharge; a fall within the facility; wrong site/side/patient/procedure/implant event; or a hospital transfer or hospital admission upon discharge from the facility. (G8907) Patient did not have preoperative order for IV antibiotic SSI prophylaxis. (G8918)  

## 2013-11-15 NOTE — Telephone Encounter (Signed)
Message copied by Annett Fabian on Mon Nov 15, 2013  2:26 PM ------      Message from: Stan Head E      Created: Mon Nov 15, 2013 12:37 PM      Regarding: needs pre-visit and colonoscopy       He had EGD today      I rec office f/u but he actually needs a colonoscopy             Please arrange a pre-visit and colonoscopy (hx polyps)      With colonoscopy in mid/late Jan vs early Feb ------

## 2013-11-15 NOTE — Telephone Encounter (Signed)
Patient is scheduled for a colon 01/17/14 and pre-visit 01/10/14.

## 2013-11-15 NOTE — Patient Instructions (Addendum)
You have a stricture - slightly narrowed area - where the esophagus and stomach meet. It was stretched and biopsied today.  I hope this helps your swallowing problem.  Please stay on the pantoprazole - call my office soon and schedule a follow-up visit for early February.  I appreciate the opportunity to care for you. Iva Boop, MD, FACG  YOU HAD AN ENDOSCOPIC PROCEDURE TODAY AT THE [facility]: Refer to the procedure report that was given to you for any specific questions about what was found during the examination.  If the procedure report does not answer your questions, please call your gastroenterologist to clarify.  If you requested that your care partner not be given the details of your procedure findings, then the procedure report has been included in a sealed envelope for you to review at your convenience later.  YOU SHOULD EXPECT: Some feelings of bloating in the abdomen. Passage of more gas than usual.  Walking can help get rid of the air that was put into your GI tract during the procedure and reduce the bloating. If you had a lower endoscopy (such as a colonoscopy or flexible sigmoidoscopy) you may notice spotting of blood in your stool or on the toilet paper. If you underwent a bowel prep for your procedure, then you may not have a normal bowel movement for a few days.  DIET: Your first meal following the procedure should be a light meal and then it is ok to progress to your normal diet.  A half-sandwich or bowl of soup is an example of a good first meal.  Heavy or fried foods are harder to digest and may make you feel nauseous or bloated.  Likewise meals heavy in dairy and vegetables can cause extra gas to form and this can also increase the bloating.  Drink plenty of fluids but you should avoid alcoholic beverages for 24 hours.  ACTIVITY: Your care partner should take you home directly after the procedure.  You should plan to take it easy, moving slowly for the rest  of the day.  You can resume normal activity the day after the procedure however you should NOT DRIVE or use heavy machinery for 24 hours (because of the sedation medicines used during the test).    SYMPTOMS TO REPORT IMMEDIATELY: A gastroenterologist can be reached at any hour.  During normal business hours, 8:30 AM to 5:00 PM Monday through Friday, call [phone].  After hours and on weekends, please call the GI answering service at [phone] who will take a message and have the physician on call contact you.     Following upper endoscopy (EGD)  Vomiting of blood or coffee ground material  New chest pain or pain under the shoulder blades  Painful or persistently difficult swallowing  New shortness of breath  Fever of 100F or higher  Black, tarry-looking stools  FOLLOW UP: If any biopsies were taken you will be contacted by phone or by letter within the next 1-3 weeks.  Call your gastroenterologist if you have not heard about the biopsies in 3 weeks.  Our staff will call the home number listed on your records the next business day following your procedure to check on you and address any questions or concerns that you may have at that time regarding the information given to you following your procedure. This is a courtesy call and so if there is no answer at the home number and we have not heard from you  through the emergency physician on call, we will assume that you have returned to your regular daily activities without incident.  SIGNATURES/CONFIDENTIALITY: You and/or your care partner have signed paperwork which will be entered into your electronic medical record.  These signatures attest to the fact that that the information above on your After Visit Summary has been reviewed and is understood.  Full responsibility of the confidentiality of this discharge information lies with you and/or your care-partner.  Stricture information given.  Post dilation given with instructions by  nurse.  Follow up in February with Dr. Leone Payor, his office will let you know about appointmen.,

## 2013-11-15 NOTE — Progress Notes (Signed)
Called to room to assist during endoscopic procedure.  Patient ID and intended procedure confirmed with present staff. Received instructions for my participation in the procedure from the performing physician.  

## 2013-11-15 NOTE — Op Note (Signed)
[facility] [address] [place], [address]   ENDOSCOPY PROCEDURE REPORT  PATIENT: [name], Daniel  MR#: [id number] BIRTHDATE: October 28, 1961 , 52  yrs. old GENDER: Male ENDOSCOPIST: Iva Boop, MD, [facility] PROCEDURE DATE:  11/15/2013 PROCEDURE:  EGD w/ biopsy and Maloney dilation of esophagus ASA CLASS:     Class III INDICATIONS:  Dysphagia.   Chest pain. MEDICATIONS: propofol (Diprivan) 200mg  IV, MAC sedation, administered by CRNA, and These medications were titrated to patient response per physician's verbal order TOPICAL ANESTHETIC: Cetacaine Spray  DESCRIPTION OF PROCEDURE: After the risks benefits and alternatives of the procedure were thoroughly explained, informed consent was obtained.  The LB [id number] endoscope was introduced through the mouth and advanced to the second portion of the duodenum. Without limitations.  The instrument was slowly withdrawn as the mucosa was fully examined.        ESOPHAGUS: A stricture was found at the gastroesophageal junction. The stenosis was traversable with the endoscope.  Multiple biopsies were performed using cold forceps (after dilation attempt - see below).  Sample sent for histology.  The remainder of the upper endoscopy exam was otherwise normal. Retroflexed views revealed no abnormalities.     The scope was then withdrawn from the patient, 54 Fr Maloney dilator was passed with moderate resistance and trace heme. Re-inspection did not reveal significant disruption of stricture so bx x 3 done also.  COMPLICATIONS: There were no complications. ENDOSCOPIC IMPRESSION: 1.   Stricture was found at the gastroesophageal junction; dilated to 54 Fr and multiple biopsies 2.   The remainder of the upper endoscopy exam was otherwise normal  RECOMMENDATIONS: 1.  Continue PPI 2.  Clear liquids until 1 PM , then soft foods rest of day.  Resume prior diet tomorrow. 3.  call soon and schedule return visit  with Dr. Leone Payor for February 2015  eSigned:  Iva Boop, MD, [facility] 11/15/2013 12:31 PM   CC:The Patient and Crawford Givens, MD

## 2013-11-16 ENCOUNTER — Telehealth: Payer: Self-pay | Admitting: *Deleted

## 2013-11-16 NOTE — Telephone Encounter (Signed)
Message left

## 2013-11-19 ENCOUNTER — Encounter: Payer: Self-pay | Admitting: Internal Medicine

## 2013-11-19 NOTE — Progress Notes (Signed)
Quick Note:  Benign tissue  ______

## 2014-01-10 ENCOUNTER — Ambulatory Visit (AMBULATORY_SURGERY_CENTER): Payer: Self-pay | Admitting: *Deleted

## 2014-01-10 VITALS — Ht 73.0 in | Wt 267.0 lb

## 2014-01-10 DIAGNOSIS — Z86010 Personal history of colonic polyps: Secondary | ICD-10-CM

## 2014-01-10 MED ORDER — NA SULFATE-K SULFATE-MG SULF 17.5-3.13-1.6 GM/177ML PO SOLN: ORAL | Status: DC

## 2014-01-10 NOTE — Progress Notes (Signed)
Patient denies any allergies to eggs or soy. Patient denies any problems with anesthesia.  

## 2014-01-12 ENCOUNTER — Encounter: Payer: Self-pay | Admitting: Internal Medicine

## 2014-01-17 ENCOUNTER — Encounter: Payer: Self-pay | Admitting: Internal Medicine

## 2014-01-17 ENCOUNTER — Ambulatory Visit (AMBULATORY_SURGERY_CENTER): Payer: 59 | Admitting: Internal Medicine

## 2014-01-17 VITALS — BP 154/77 | HR 64 | Temp 98.8°F | Resp 19 | Ht 73.0 in | Wt 267.0 lb

## 2014-01-17 DIAGNOSIS — Z86010 Personal history of colonic polyps: Secondary | ICD-10-CM

## 2014-01-17 MED ORDER — SODIUM CHLORIDE 0.9 % IV SOLN: 500.0000 mL | INTRAVENOUS | Status: DC

## 2014-01-17 NOTE — Op Note (Signed)
[address] [place], [address]   COLONOSCOPY PROCEDURE REPORT  PATIENT: Daniel [name], Daniel [name]  MR#: [id number] BIRTHDATE: 09/29/1961 , 69  yrs. old GENDER: Male ENDOSCOPIST: Gatha Mayer, MD, [facility] PROCEDURE DATE:  01/17/2014 PROCEDURE:   Colonoscopy, surveillance First Screening Colonoscopy - Avg.  risk and is 50 yrs.  old or older - No.  Prior Negative Screening - Now for repeat screening. N/A  History of Adenoma - Now for follow-up colonoscopy & has been > or = to 3 yrs.  No.  It has been less than 3 yrs since last colonoscopy.  Other: See Comments  Polyps Removed Today? No. Recommend repeat exam, <10 yrs? No. ASA CLASS:   Class II INDICATIONS:Patient's personal history of adenomatous colon polyps. Close f/u after large TV adneoma on initial colonoscopy MEDICATIONS: propofol (Diprivan) 300mg  IV, MAC sedation, administered by CRNA, and These medications were titrated to patient response per physician's verbal order  DESCRIPTION OF PROCEDURE:   After the risks benefits and alternatives of the procedure were thoroughly explained, informed consent was obtained.  A digital rectal exam revealed no abnormalities of the rectum, A digital rectal exam revealed no prostatic nodules, and A digital rectal exam revealed the prostate was not enlarged.   The LB [id number]  endoscope was introduced through the anus and advanced to the surgical anastomosis. No adverse events experienced.   The quality of the prep was excellent using Suprep  The instrument was then slowly withdrawn as the colon was fully examined.      COLON FINDINGS: There was evidence of a prior end-to-side ileocolonic surgical anastomosis The finding was in the right colon.   The colon mucosa was otherwise normal.  Retroflexed views revealed internal hemorrhoids. The time to cecum=3 minutes 30 seconds.  Withdrawal time=9 minutes 25 seconds.  The scope was withdrawn and the procedure  completed. COMPLICATIONS: There were no complications.  ENDOSCOPIC IMPRESSION: 1.   There was evidence of a prior ileocolonic surgical anastomosis in the right colon 2.   The colon mucosa was otherwise normal - excellent prep - hx large TV adenoma resected 2013 3.    Internal hemorrhoids in rectum  RECOMMENDATIONS: 1.  Repeat Colonoscopy in 5 years. 2.   F/U PRN GERD and stricture - stay on PPI   eSigned:  Gatha Mayer, MD, [facility] 01/17/2014 8:41 AM   cc: The Patient and Elsie Stain, MD

## 2014-01-17 NOTE — Patient Instructions (Addendum)
No polyps today! You do have some hemorrhoids - If you have hemorrhoid problems (swelling, itching, bleeding) I am able to treat those with an in-office procedure. If you like, please call my office at [phone] to schedule an appointment and I can evaluate you further.  Next routine colonoscopy in 5 years - 2020.  I am glad your swallowing is better. See me as needed for that.  I appreciate the opportunity to care for you. Gatha Mayer, MD, FACG   YOU HAD AN ENDOSCOPIC PROCEDURE TODAY AT [facility]: Refer to the procedure report that was given to you for any specific questions about what was found during the examination.  If the procedure report does not answer your questions, please call your gastroenterologist to clarify.  If you requested that your care partner not be given the details of your procedure findings, then the procedure report has been included in a sealed envelope for you to review at your convenience later.  YOU SHOULD EXPECT: Some feelings of bloating in the abdomen. Passage of more gas than usual.  Walking can help get rid of the air that was put into your GI tract during the procedure and reduce the bloating. If you had a lower endoscopy (such as a colonoscopy or flexible sigmoidoscopy) you may notice spotting of blood in your stool or on the toilet paper. If you underwent a bowel prep for your procedure, then you may not have a normal bowel movement for a few days.  DIET: Your first meal following the procedure should be a light meal and then it is ok to progress to your normal diet.  A half-sandwich or bowl of soup is an example of a good first meal.  Heavy or fried foods are harder to digest and may make you feel nauseous or bloated.  Likewise meals heavy in dairy and vegetables can cause extra gas to form and this can also increase the bloating.  Drink plenty of fluids but you should avoid alcoholic beverages for 24 hours.  ACTIVITY: Your care partner  should take you home directly after the procedure.  You should plan to take it easy, moving slowly for the rest of the day.  You can resume normal activity the day after the procedure however you should NOT DRIVE or use heavy machinery for 24 hours (because of the sedation medicines used during the test).    SYMPTOMS TO REPORT IMMEDIATELY: A gastroenterologist can be reached at any hour.  During normal business hours, 8:30 AM to 5:00 PM Monday through Friday, call [phone].  After hours and on weekends, please call the GI answering service at [phone] who will take a message and have the physician on call contact you.   Following lower endoscopy (colonoscopy or flexible sigmoidoscopy):  Excessive amounts of blood in the stool  Significant tenderness or worsening of abdominal pains  Swelling of the abdomen that is new, acute  Fever of 100F or higher    FOLLOW UP: If any biopsies were taken you will be contacted by phone or by letter within the next 1-3 weeks.  Call your gastroenterologist if you have not heard about the biopsies in 3 weeks.  Our staff will call the home number listed on your records the next business day following your procedure to check on you and address any questions or concerns that you may have at that time regarding the information given to you following your procedure. This is a courtesy call and  so if there is no answer at the home number and we have not heard from you through the emergency physician on call, we will assume that you have returned to your regular daily activities without incident.  SIGNATURES/CONFIDENTIALITY: You and/or your care partner have signed paperwork which will be entered into your electronic medical record.  These signatures attest to the fact that that the information above on your After Visit Summary has been reviewed and is understood.  Full responsibility of the confidentiality of this discharge information lies with you and/or  your care-partner.  Hemorrhoid information given.

## 2014-01-18 ENCOUNTER — Telehealth: Payer: Self-pay | Admitting: *Deleted

## 2014-01-18 NOTE — Telephone Encounter (Signed)
  Follow up Call-  Call back number 01/17/2014 11/15/2013 11/21/2011  Post procedure Call Back phone  # [phone] hm [phone]  ok to leave message  Permission to leave phone message Yes Yes -     Patient questions:  Do you have a fever, pain , or abdominal swelling? no Pain Score  0 *  Have you tolerated food without any problems? yes  Have you been able to return to your normal activities? yes  Do you have any questions about your discharge instructions: Diet   no Medications  no Follow up visit  no  Do you have questions or concerns about your Care? no  Actions: * If pain score is 4 or above: No action needed, pain <4.

## 2014-03-07 ENCOUNTER — Ambulatory Visit (INDEPENDENT_AMBULATORY_CARE_PROVIDER_SITE_OTHER): Payer: 59 | Admitting: Family Medicine

## 2014-03-07 ENCOUNTER — Encounter: Payer: Self-pay | Admitting: Family Medicine

## 2014-03-07 VITALS — BP 150/86 | HR 78 | Temp 98.3°F | Wt 263.8 lb

## 2014-03-07 DIAGNOSIS — K219 Gastro-esophageal reflux disease without esophagitis: Secondary | ICD-10-CM

## 2014-03-07 DIAGNOSIS — R1310 Dysphagia, unspecified: Secondary | ICD-10-CM

## 2014-03-07 DIAGNOSIS — R1314 Dysphagia, pharyngoesophageal phase: Secondary | ICD-10-CM

## 2014-03-07 DIAGNOSIS — R1319 Other dysphagia: Secondary | ICD-10-CM

## 2014-03-07 MED ORDER — PANTOPRAZOLE SODIUM 40 MG PO TBEC: DELAYED_RELEASE_TABLET | ORAL | Status: DC

## 2014-03-07 NOTE — Assessment & Plan Note (Signed)
Likely worse after dilation.  HOB already elevated.  Try BID PPI and smaller meals. Already avoiding caffeine. D/w pt about gradual weight loss.  Not at all likely to be/typical of cardiac sx.  He agrees.

## 2014-03-07 NOTE — Patient Instructions (Signed)
Smaller meals, try the protonix 2 times a day for now, update me if not better.  Take care.

## 2014-03-07 NOTE — Progress Notes (Signed)
Pre visit review using our clinic review tool, if applicable. No additional management support is needed unless otherwise documented below in the visit note.  Upper abd pain, at the sternum.  Didn't have them the last few days and nights.  Burning with regurgitation noted.  Working on diet, avoiding greasy foods.  Intermittent, can be bad, mild or absent.  Not exertional.  On PPI.  No nausea.  Can happen at rest.  No fevers, no chills.  No blood in stool.  No lower abd pain.  H/o EGD with dilated stricture 12/14.  He can still work w/o getting SOB.  "It hurts."  Less painful with massaging the area.  He doesn't have typical indigestion sx.  It can last for days at a time.  Sx got worse since the dilation.    Meds, vitals, and allergies reviewed.   ROS: See HPI.  Otherwise, noncontributory.  GEN: nad, alert and oriented HEENT: mucous membranes moist NECK: supple w/o LA CV: rrr PULM: ctab, no inc wob ABD: soft, +bs, not ttp EXT: no edema SKIN: no acute rash

## 2014-08-09 ENCOUNTER — Encounter: Payer: Self-pay | Admitting: Family Medicine

## 2014-08-09 ENCOUNTER — Ambulatory Visit (INDEPENDENT_AMBULATORY_CARE_PROVIDER_SITE_OTHER): Payer: 59 | Admitting: Family Medicine

## 2014-08-09 VITALS — BP 146/86 | HR 84 | Temp 98.2°F | Wt 240.8 lb

## 2014-08-09 DIAGNOSIS — M5430 Sciatica, unspecified side: Secondary | ICD-10-CM

## 2014-08-09 DIAGNOSIS — M5431 Sciatica, right side: Secondary | ICD-10-CM

## 2014-08-09 MED ORDER — PREDNISONE 20 MG PO TABS: ORAL_TABLET | ORAL | Status: DC

## 2014-08-09 NOTE — Patient Instructions (Signed)
Sciatica.  Take prednisone with food.  Stop the aleve.  Keep going with the chiropractor and the stretching.  If not better, then notify me.   Take care.

## 2014-08-09 NOTE — Progress Notes (Signed)
Pre visit review using our clinic review tool, if applicable. No additional management support is needed unless otherwise documented below in the visit note.  R leg pain.  Starts at the hip, radiates down the posterior/lateral side.  No help overall with chiropractor tx, temporary help only.  R foot can go numb.  No trauma.  No L sided pain.  Has exercises to do at home, using ice, pillow between the knees.  Worse after sitting.  Better walking.  No weakness.  No B/B sx.  No midline back pain.  No sx like this prev.  Aleve didn't help.  Sx going on for about 1 month.    Meds, vitals, and allergies reviewed.   ROS: See HPI.  Otherwise, noncontributory.  nad but uncomfortable Walking with a limp from R leg pain rrr ctab Abd soft Back w/o midline pain S/S wnl for BLE.  R SLR positive. Neg L SLR

## 2014-08-09 NOTE — Assessment & Plan Note (Signed)
D/w pt re: anatomy.  Steroid taper with GI cautions, steroid cautions.  Continue with chiropractor and stretching.  If not better, he'll notify me and we can refer out.  He agrees.  No emergent sx.

## 2014-09-09 ENCOUNTER — Telehealth: Payer: Self-pay | Admitting: Family Medicine

## 2014-09-09 DIAGNOSIS — M5430 Sciatica, unspecified side: Secondary | ICD-10-CM

## 2014-09-09 NOTE — Telephone Encounter (Signed)
Ordered. Thanks

## 2014-09-09 NOTE — Telephone Encounter (Signed)
Pt called requesting referral to neurosurgeon for pinched nerve in back. Please advise.

## 2014-09-12 NOTE — Telephone Encounter (Signed)
Patient notified by telephone that order has been placed and he will hear back from the referral coordinator to get this set up.

## 2014-09-16 ENCOUNTER — Other Ambulatory Visit: Payer: Self-pay | Admitting: Family Medicine

## 2015-05-09 ENCOUNTER — Other Ambulatory Visit: Payer: Self-pay | Admitting: Family Medicine

## 2015-06-05 ENCOUNTER — Other Ambulatory Visit: Payer: Self-pay | Admitting: Family Medicine

## 2015-10-22 ENCOUNTER — Other Ambulatory Visit: Payer: Self-pay | Admitting: Family Medicine

## 2015-11-05 ENCOUNTER — Other Ambulatory Visit: Payer: Self-pay | Admitting: Family Medicine

## 2015-12-18 ENCOUNTER — Encounter (HOSPITAL_COMMUNITY): Payer: Self-pay | Admitting: Physical Medicine and Rehabilitation

## 2015-12-18 ENCOUNTER — Emergency Department (HOSPITAL_COMMUNITY)
Admission: EM | Admit: 2015-12-18 | Discharge: 2015-12-18 | Disposition: A | Discharge status: Home / Self Care | Payer: Commercial Managed Care - HMO | Attending: Emergency Medicine | Admitting: Emergency Medicine

## 2015-12-18 ENCOUNTER — Emergency Department (HOSPITAL_COMMUNITY): Payer: Commercial Managed Care - HMO

## 2015-12-18 DIAGNOSIS — E785 Hyperlipidemia, unspecified: Secondary | ICD-10-CM | POA: Insufficient documentation

## 2015-12-18 DIAGNOSIS — Z7982 Long term (current) use of aspirin: Secondary | ICD-10-CM | POA: Insufficient documentation

## 2015-12-18 DIAGNOSIS — K219 Gastro-esophageal reflux disease without esophagitis: Secondary | ICD-10-CM | POA: Diagnosis not present

## 2015-12-18 DIAGNOSIS — R61 Generalized hyperhidrosis: Secondary | ICD-10-CM | POA: Insufficient documentation

## 2015-12-18 DIAGNOSIS — R110 Nausea: Secondary | ICD-10-CM | POA: Diagnosis not present

## 2015-12-18 DIAGNOSIS — R011 Cardiac murmur, unspecified: Secondary | ICD-10-CM | POA: Diagnosis not present

## 2015-12-18 DIAGNOSIS — I10 Essential (primary) hypertension: Secondary | ICD-10-CM | POA: Insufficient documentation

## 2015-12-18 DIAGNOSIS — R079 Chest pain, unspecified: Secondary | ICD-10-CM

## 2015-12-18 LAB — BASIC METABOLIC PANEL
ANION GAP: 9 (ref 5–15)
BUN: 17 mg/dL (ref 6–20)
CO2: 25 mmol/L (ref 22–32)
Calcium: 8.7 mg/dL — ABNORMAL LOW (ref 8.9–10.3)
Chloride: 106 mmol/L (ref 101–111)
Creatinine, Ser: 1.08 mg/dL (ref 0.61–1.24)
GFR calc Af Amer: >60 mL/min (ref >60)
GFR calc non Af Amer: >60 mL/min (ref >60)
GLUCOSE: 118 mg/dL — AB (ref 65–99)
POTASSIUM: 3.6 mmol/L (ref 3.5–5.1)
Sodium: 140 mmol/L (ref 135–145)

## 2015-12-18 LAB — CBC
HEMATOCRIT: 39.5 % (ref 39.0–52.0)
HEMOGLOBIN: 12.7 g/dL — AB (ref 13.0–17.0)
MCH: 26 pg (ref 26.0–34.0)
MCHC: 32.2 g/dL (ref 30.0–36.0)
MCV: 80.9 fL (ref 78.0–100.0)
PLATELETS: 206 10*3/uL (ref 150–400)
RBC: 4.88 MIL/uL (ref 4.22–5.81)
RDW: 14.3 % (ref 11.5–15.5)
WBC: 6.4 10*3/uL (ref 4.0–10.5)

## 2015-12-18 LAB — I-STAT TROPONIN, ED
TROPONIN I, POC: 0 ng/mL (ref 0.00–0.08)
Troponin i, poc: 0 ng/mL (ref 0.00–0.08)

## 2015-12-18 NOTE — ED Provider Notes (Signed)
CSN: [id number]     Arrival date & time 12/18/15  0423 History   First MD Initiated Contact with Patient 12/18/15 [phone]     Chief Complaint  Patient presents with  . Chest Pain     (Consider location/radiation/quality/duration/timing/severity/associated sxs/prior Treatment) Patient is a 55 y.o. male presenting with chest pain. The history is provided by the patient.  Chest Pain Pain location:  L chest Pain quality: pressure   Pain radiates to:  L arm and R arm Pain radiates to the back: no   Pain severity:  Moderate Onset quality:  Gradual Duration:  6 hours Timing:  Constant Progression:  Partially resolved (post nitro) Chronicity:  Recurrent Relieved by:  Nothing Worsened by:  Nothing tried Ineffective treatments:  None tried Associated symptoms: diaphoresis and nausea   Associated symptoms: no abdominal pain, no cough, no fever, no headache, no palpitations, no shortness of breath and not vomiting   Risk factors: high cholesterol, hypertension, male sex and smoking   Risk factors: no aortic disease, no coronary artery disease and no diabetes mellitus    55 yo M with left sided chest pressure.  Started suddenly while at rest.  Nothing makes it better or worse.  Associated with diaphoresis, n, radiates to both shoulders.  Hx of similar pain in 2012 seen by cards felt to be non cardiac after a negative nuclear stress.   Past Medical History  Diagnosis Date  . Hyperlipidemia   . Hypertension   . 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 Chest pain     12/12:  Lexiscan Myoview demonstrated no ischemia or scar.  His EF was 41%.; Echocardiogram 01/16/12: LVH, EF [id number], diastolic function normal, no wall motion abnormalities, moderate LAE, mild RVE   . Mass of colon   . Heart murmur 02-18-12    as child, not now  . GERD (gastroesophageal reflux disease) 02-18-12    none in 5 months  . Tubular adenoma of  colon 11/2011  . Villous adenoma of right colon 03/12/2012    Laparoscopic right colectomy 02/25/2012   . Esophageal stricture 11/08/2013   Past Surgical History  Procedure Laterality Date  . Colonoscopy  11/21/11  . Laparoscopic right colon resection  2013    adenomatous polyp  . Colon resection  02/25/2012    Procedure: LAPAROSCOPIC RIGHT COLON RESECTION;  Surgeon: Adin Hector, MD;  Location: [facility];  Service: General;  Laterality: Right;  laparoscopic assisted right colectomy  . Appendectomy  2013   Family History  Problem Relation Age of Onset  . Heart disease Mother 17    MI, died at age 72  . Hypertension Mother   . Coronary artery disease Mother   . Alcohol abuse Father     cirrhoisis of liver-- ETOH abuse  . Colon cancer Neg Hx   . Prostate cancer Neg Hx   . Esophageal cancer Neg Hx   . Rectal cancer Neg Hx   . Stomach cancer Neg Hx    Social History  Substance Use Topics  . Smoking status: Never Smoker   . Smokeless tobacco: Never Used  . Alcohol Use: No    Review of Systems  Constitutional: Positive for diaphoresis. Negative for fever and chills.  HENT: Negative for congestion and facial swelling.   Eyes: Negative for discharge and visual disturbance.  Respiratory: Negative for cough and shortness of breath.   Cardiovascular: Positive for  chest pain. Negative for palpitations.  Gastrointestinal: Positive for nausea. Negative for vomiting, abdominal pain and diarrhea.  Musculoskeletal: Negative for myalgias and arthralgias.  Skin: Negative for color change and rash.  Neurological: Negative for tremors, syncope and headaches.  Psychiatric/Behavioral: Negative for confusion and dysphoric mood.      Allergies  Oxycodone and Lisinopril  Home Medications   Prior to Admission medications   Medication Sig Start Date End Date Taking? Authorizing Provider  amLODipine (NORVASC) 10 MG tablet TAKE 1 TABLET BY MOUTH EVERY DAY FOR BLOOD PRESSURE 09/19/14  Yes  Tonia Ghent, MD  aspirin 81 MG chewable tablet Chew 324 mg by mouth once.   Yes Historical Provider, MD  aspirin 81 MG tablet Take 81 mg by mouth daily.   Yes Historical Provider, MD  Multiple Vitamin (MULITIVITAMIN WITH MINERALS) TABS Take 1 tablet by mouth every morning.   Yes Historical Provider, MD  losartan (COZAAR) 50 MG tablet Take 1.5 tablets (75 mg total) by mouth every morning. Patient not taking: Reported on 12/18/2015 07/12/13   Tonia Ghent, MD  pantoprazole (PROTONIX) 40 MG tablet 1 tab 1-2 times a day Patient not taking: Reported on 12/18/2015 03/07/14   Tonia Ghent, MD  pravastatin (PRAVACHOL) 40 MG tablet TAKE 1 TABLET BY MOUTH DAILY. Patient not taking: Reported on 12/18/2015 07/12/13   Tonia Ghent, MD  predniSONE (DELTASONE) 20 MG tablet Take 3 a day for 3 days, then 2 a day for 3 days, then 1 a day for 3 days, then 0.5 a day for 4 days. With food. Patient not taking: Reported on 12/18/2015 08/09/14   Tonia Ghent, MD   BP 131/88 mmHg  Pulse 62  Temp(Src) 98.1 F (36.7 C) (Oral)  Resp 16  Ht 6\' 1"  (1.854 m)  Wt 254 lb (115.214 kg)  BMI 33.52 kg/m2  SpO2 96% Physical Exam  Constitutional: He is oriented to person, place, and time. He appears well-developed and well-nourished.  HENT:  Head: Normocephalic and atraumatic.  Eyes: EOM are normal. Pupils are equal, round, and reactive to light.  Neck: Normal range of motion. Neck supple. No JVD present.  Cardiovascular: Normal rate and regular rhythm.  Exam reveals no gallop and no friction rub.   No murmur heard. Pulmonary/Chest: No respiratory distress. He has no wheezes. He exhibits tenderness (left sided, reproduces pain).  Abdominal: He exhibits no distension. There is no rebound and no guarding.  Musculoskeletal: Normal range of motion.  Neurological: He is alert and oriented to person, place, and time.  Skin: No rash noted. No pallor.  Psychiatric: He has a normal mood and affect. His behavior is normal.   Nursing note and vitals reviewed.   ED Course  Procedures (including critical care time) Labs Review Labs Reviewed  BASIC METABOLIC PANEL - Abnormal; Notable for the following:    Glucose, Bld 118 (*)    Calcium 8.7 (*)    All other components within normal limits  CBC - Abnormal; Notable for the following:    Hemoglobin 12.7 (*)    All other components within normal limits  I-STAT TROPOININ, ED  Randolm Idol, ED    Imaging Review Dg Chest 2 View  12/18/2015  CLINICAL DATA:  Acute onset of left-sided chest pain and left arm pain. Initial encounter. EXAM: CHEST  2 VIEW COMPARISON:  Chest radiograph performed 11/27/2011 FINDINGS: The lungs are well-aerated. Mild bibasilar atelectasis is noted. Pulmonary vascularity is at the upper limits of normal. There is  no evidence of pleural effusion or pneumothorax. The heart is normal in size; the mediastinal contour is within normal limits. No acute osseous abnormalities are seen. IMPRESSION: Mild bibasilar atelectasis noted.  Lungs otherwise clear. Electronically Signed   By: Garald Balding M.D.   On: 12/18/2015 05:48   I have personally reviewed and evaluated these images and lab results as part of my medical decision-making.   EKG Interpretation   Date/Time:  Monday December 18 2015 04:45:19 EST Ventricular Rate:  74 PR Interval:  176 QRS Duration: 94 QT Interval:  361 QTC Calculation: 400 R Axis:   68 Text Interpretation:  Sinus rhythm Probable left atrial enlargement No  significant change since last tracing Confirmed by Airrion Otting MD, DANIEL  [id number]) on 12/18/2015 5:08:34 AM      MDM   Final diagnoses:  Chest pain, unspecified chest pain type    55 yo M with cc of chest pain.  Patient with no hx of prior CAD, multiple risk factors.  Story mildly concerning but reproducible with palpation.  ECG unchanged from prior. Will delta trop.  Initial trop negative, turned over to Dr. Regenia Skeeter.   Medications administered to the patient  during their visit and any new prescriptions provided to the patient are listed below.  Medications given during this visit Medications - No data to display  Discharge Medication List as of 12/18/2015  8:56 AM      The patient appears reasonably screen and/or stabilized for discharge and I doubt any other medical condition or other [facility] requiring further screening, evaluation, or treatment in the ED at this time prior to discharge.      Deno Etienne, DO 12/18/15 1737

## 2015-12-18 NOTE — ED Notes (Signed)
Pt home stable with steady gait after verbalizes understanding of instructions.

## 2015-12-18 NOTE — ED Provider Notes (Signed)
Second troponin is negative. Per prior plan with Dr. Tyrone Nine, patient will be discharged with outpatient follow up with PCP. Given strict return precautions.  Sherwood Gambler, MD 12/18/15 [phone]

## 2015-12-18 NOTE — ED Notes (Addendum)
Pt to department via [facility] for evaluation of L sided chest pain radiating to L arm, sinus tachycardia and HTN noted on scene. Received 324 ASA and (3) sublingual nitro with relief. 6/10 L sided chest pain upon arrival to ED. Pt is alert and oriented x4. 18g R hand.

## 2015-12-20 ENCOUNTER — Other Ambulatory Visit (INDEPENDENT_AMBULATORY_CARE_PROVIDER_SITE_OTHER): Payer: Commercial Managed Care - HMO

## 2015-12-20 ENCOUNTER — Other Ambulatory Visit: Payer: Self-pay | Admitting: Family Medicine

## 2015-12-20 DIAGNOSIS — Z125 Encounter for screening for malignant neoplasm of prostate: Secondary | ICD-10-CM | POA: Diagnosis not present

## 2015-12-20 DIAGNOSIS — E782 Mixed hyperlipidemia: Secondary | ICD-10-CM | POA: Diagnosis not present

## 2015-12-20 LAB — COMPREHENSIVE METABOLIC PANEL
ALBUMIN: 4 g/dL (ref 3.5–5.2)
ALK PHOS: 62 U/L (ref 39–117)
ALT: 15 U/L (ref 0–53)
AST: 14 U/L (ref 0–37)
BUN: 16 mg/dL (ref 6–23)
CHLORIDE: 105 meq/L (ref 96–112)
CO2: 30 mEq/L (ref 19–32)
Calcium: 9.2 mg/dL (ref 8.4–10.5)
Creatinine, Ser: 1.03 mg/dL (ref 0.40–1.50)
GFR: 96.55 mL/min (ref >60.00)
Glucose, Bld: 91 mg/dL (ref 70–99)
POTASSIUM: 4.2 meq/L (ref 3.5–5.1)
Sodium: 142 mEq/L (ref 135–145)
TOTAL PROTEIN: 7.1 g/dL (ref 6.0–8.3)
Total Bilirubin: 0.4 mg/dL (ref 0.2–1.2)

## 2015-12-20 LAB — LIPID PANEL
CHOLESTEROL: 226 mg/dL — AB (ref 0–200)
HDL: 46.6 mg/dL (ref >39.00)
LDL Cholesterol: 158 mg/dL — ABNORMAL HIGH (ref 0–99)
NonHDL: 179.81
TRIGLYCERIDES: 109 mg/dL (ref 0.0–149.0)
Total CHOL/HDL Ratio: 5
VLDL: 21.8 mg/dL (ref 0.0–40.0)

## 2015-12-20 LAB — PSA: PSA: 2.82 ng/mL (ref 0.10–4.00)

## 2015-12-22 ENCOUNTER — Ambulatory Visit (INDEPENDENT_AMBULATORY_CARE_PROVIDER_SITE_OTHER): Payer: Commercial Managed Care - HMO | Admitting: Family Medicine

## 2015-12-22 ENCOUNTER — Encounter: Payer: Self-pay | Admitting: Family Medicine

## 2015-12-22 VITALS — BP 168/100 | HR 77 | Temp 98.7°F | Ht 71.25 in | Wt 259.5 lb

## 2015-12-22 DIAGNOSIS — E785 Hyperlipidemia, unspecified: Secondary | ICD-10-CM

## 2015-12-22 DIAGNOSIS — R1319 Other dysphagia: Secondary | ICD-10-CM

## 2015-12-22 DIAGNOSIS — Z0000 Encounter for general adult medical examination without abnormal findings: Secondary | ICD-10-CM

## 2015-12-22 DIAGNOSIS — R1310 Dysphagia, unspecified: Secondary | ICD-10-CM

## 2015-12-22 DIAGNOSIS — K219 Gastro-esophageal reflux disease without esophagitis: Secondary | ICD-10-CM

## 2015-12-22 DIAGNOSIS — I10 Essential (primary) hypertension: Secondary | ICD-10-CM

## 2015-12-22 DIAGNOSIS — Z7189 Other specified counseling: Secondary | ICD-10-CM

## 2015-12-22 MED ORDER — LOSARTAN POTASSIUM 50 MG PO TABS: 75.0000 mg | ORAL_TABLET | ORAL | Status: DC

## 2015-12-22 MED ORDER — AMLODIPINE BESYLATE 10 MG PO TABS: ORAL_TABLET | ORAL | Status: DC

## 2015-12-22 MED ORDER — PRAVASTATIN SODIUM 40 MG PO TABS: ORAL_TABLET | ORAL | Status: DC

## 2015-12-22 MED ORDER — PANTOPRAZOLE SODIUM 40 MG PO TBEC: DELAYED_RELEASE_TABLET | ORAL | Status: DC

## 2015-12-22 NOTE — Patient Instructions (Signed)
Get back on the amlodipine and update me if your BP isn't consistently <140/<90. Get back to exercising and update me as needed.   Recheck lipids in about 5 months.   Take care.  Glad to see you.

## 2015-12-22 NOTE — Progress Notes (Signed)
Pre visit review using our clinic review tool, if applicable. No additional management support is needed unless otherwise documented below in the visit note.  CPE- See plan.  Routine anticipatory guidance given to patient.  See health maintenance. Tetanus 2014- He had a needle stick at work with [employer]. Had f/u testing and tetanus at that point.  HIV and HCV neg 2014 per patient report with the needle stick event 2014 Flu shot prev done.  Shingles and PNA shot d/w pt. He had shingles this year. Resolved now.  Colonoscopy 2015 PSA d/w pt. Prostate cancer screening and PSA options (with potential risks and benefits of testing vs not testing) were discussed along with recent recs/guidelines. He accepted testing PSA at this point.  Living will. Would have his daughter designated if he were incapacitated.  Diet and exercise limited recently.  D/w pt. He'll work on both.    Recently to ER with L sided chest pain with neg eval, thought to be MSK source.  No SOB, CP o/w.  No exertional sx.   Using medication without problems or lightheadedness: yes, but ran out of amlodipine today.  Chest pain with exertion:no Edema:no Short of breath:no  PMH and SH reviewed  Meds, vitals, and allergies reviewed.   ROS: See HPI.  Otherwise negative.    GEN: nad, alert and oriented HEENT: mucous membranes moist NECK: supple w/o LA CV: rrr. PULM: ctab, no inc wob ABD: soft, +bs EXT: no edema

## 2015-12-25 DIAGNOSIS — Z7189 Other specified counseling: Secondary | ICD-10-CM | POA: Insufficient documentation

## 2015-12-25 NOTE — Assessment & Plan Note (Signed)
Tetanus 2014- He had a needle stick at work with [employer]. Had f/u testing and tetanus at that point.  HIV and HCV neg 2014 per patient report with the needle stick event 2014 Flu shot prev done.  Shingles and PNA shot d/w pt. He had shingles this year. Resolved now.  Colonoscopy 2015 PSA d/w pt. Prostate cancer screening and PSA options (with potential risks and benefits of testing vs not testing) were discussed along with recent recs/guidelines. He accepted testing PSA at this point.  Living will. Would have his daughter designated if he were incapacitated.  Diet and exercise limited recently.  D/w pt. He'll work on both.

## 2015-12-25 NOTE — Assessment & Plan Note (Signed)
He'll check BP out of clinic and update me if not controlled.  He agrees.  Continue work on diet and exercise.  We agreed to recheck lipids in about 5 months, after he has had some time to work on his weight.  I think the episode recently that lead to ER eval is highly unlikely to be cardiac in nature.  He has good ability to exercise- with his pack/equipment at work- and he has no sx now.  He'll monitor and update me as needed.  He agrees.

## 2016-01-21 ENCOUNTER — Other Ambulatory Visit: Payer: Self-pay | Admitting: Family Medicine

## 2016-05-21 ENCOUNTER — Other Ambulatory Visit: Payer: Commercial Managed Care - HMO

## 2016-05-22 ENCOUNTER — Other Ambulatory Visit (INDEPENDENT_AMBULATORY_CARE_PROVIDER_SITE_OTHER): Payer: Commercial Managed Care - HMO

## 2016-05-22 DIAGNOSIS — E785 Hyperlipidemia, unspecified: Secondary | ICD-10-CM | POA: Diagnosis not present

## 2016-05-22 LAB — LIPID PANEL
CHOLESTEROL: 155 mg/dL (ref 0–200)
HDL: 40.2 mg/dL (ref >39.00)
LDL Cholesterol: 96 mg/dL (ref 0–99)
NonHDL: 114.48
TRIGLYCERIDES: 94 mg/dL (ref 0.0–149.0)
Total CHOL/HDL Ratio: 4
VLDL: 18.8 mg/dL (ref 0.0–40.0)

## 2016-05-24 ENCOUNTER — Encounter: Payer: Self-pay | Admitting: *Deleted

## 2016-12-26 ENCOUNTER — Other Ambulatory Visit: Payer: Self-pay | Admitting: Family Medicine

## 2017-01-16 ENCOUNTER — Encounter (HOSPITAL_COMMUNITY): Payer: Self-pay | Admitting: *Deleted

## 2017-01-16 ENCOUNTER — Observation Stay (HOSPITAL_COMMUNITY)
Admission: EM | Admit: 2017-01-16 | Discharge: 2017-01-17 | Disposition: A | Discharge status: Home / Self Care | Payer: Commercial Managed Care - HMO | Attending: Internal Medicine | Admitting: Internal Medicine

## 2017-01-16 ENCOUNTER — Observation Stay (HOSPITAL_BASED_OUTPATIENT_CLINIC_OR_DEPARTMENT_OTHER): Payer: Commercial Managed Care - HMO

## 2017-01-16 ENCOUNTER — Telehealth: Payer: Self-pay | Admitting: Family Medicine

## 2017-01-16 ENCOUNTER — Emergency Department (HOSPITAL_COMMUNITY): Payer: Commercial Managed Care - HMO

## 2017-01-16 DIAGNOSIS — K219 Gastro-esophageal reflux disease without esophagitis: Secondary | ICD-10-CM | POA: Diagnosis not present

## 2017-01-16 DIAGNOSIS — M5430 Sciatica, unspecified side: Secondary | ICD-10-CM | POA: Diagnosis present

## 2017-01-16 DIAGNOSIS — Z6834 Body mass index (BMI) 34.0-34.9, adult: Secondary | ICD-10-CM | POA: Diagnosis not present

## 2017-01-16 DIAGNOSIS — J309 Allergic rhinitis, unspecified: Secondary | ICD-10-CM | POA: Diagnosis present

## 2017-01-16 DIAGNOSIS — I10 Essential (primary) hypertension: Secondary | ICD-10-CM | POA: Diagnosis not present

## 2017-01-16 DIAGNOSIS — E782 Mixed hyperlipidemia: Secondary | ICD-10-CM | POA: Diagnosis present

## 2017-01-16 DIAGNOSIS — R079 Chest pain, unspecified: Secondary | ICD-10-CM

## 2017-01-16 DIAGNOSIS — E669 Obesity, unspecified: Secondary | ICD-10-CM | POA: Diagnosis not present

## 2017-01-16 DIAGNOSIS — R0789 Other chest pain: Secondary | ICD-10-CM | POA: Diagnosis present

## 2017-01-16 DIAGNOSIS — E785 Hyperlipidemia, unspecified: Secondary | ICD-10-CM | POA: Insufficient documentation

## 2017-01-16 DIAGNOSIS — R0602 Shortness of breath: Secondary | ICD-10-CM | POA: Diagnosis not present

## 2017-01-16 DIAGNOSIS — Z7982 Long term (current) use of aspirin: Secondary | ICD-10-CM | POA: Diagnosis not present

## 2017-01-16 LAB — LIPID PANEL
Cholesterol: 153 mg/dL (ref 0–200)
HDL: 38 mg/dL — AB (ref >40)
LDL CALC: 100 mg/dL — AB (ref 0–99)
TRIGLYCERIDES: 75 mg/dL (ref <150)
Total CHOL/HDL Ratio: 4 RATIO
VLDL: 15 mg/dL (ref 0–40)

## 2017-01-16 LAB — CBC
HCT: 42.4 % (ref 39.0–52.0)
Hemoglobin: 13.4 g/dL (ref 13.0–17.0)
MCH: 25.3 pg — AB (ref 26.0–34.0)
MCHC: 31.6 g/dL (ref 30.0–36.0)
MCV: 80 fL (ref 78.0–100.0)
Platelets: 225 10*3/uL (ref 150–400)
RBC: 5.3 MIL/uL (ref 4.22–5.81)
RDW: 14.5 % (ref 11.5–15.5)
WBC: 5.5 10*3/uL (ref 4.0–10.5)

## 2017-01-16 LAB — BASIC METABOLIC PANEL
Anion gap: 9 (ref 5–15)
BUN: 12 mg/dL (ref 6–20)
CHLORIDE: 101 mmol/L (ref 101–111)
CO2: 29 mmol/L (ref 22–32)
CREATININE: 1.09 mg/dL (ref 0.61–1.24)
Calcium: 9.1 mg/dL (ref 8.9–10.3)
GFR calc Af Amer: >60 mL/min (ref >60)
GFR calc non Af Amer: >60 mL/min (ref >60)
Glucose, Bld: 107 mg/dL — ABNORMAL HIGH (ref 65–99)
Potassium: 4.5 mmol/L (ref 3.5–5.1)
Sodium: 139 mmol/L (ref 135–145)

## 2017-01-16 LAB — TROPONIN I
Troponin I: <0.03 ng/mL (ref <0.03)
Troponin I: <0.03 ng/mL (ref <0.03)

## 2017-01-16 LAB — D-DIMER, QUANTITATIVE (NOT AT ARMC)

## 2017-01-16 LAB — I-STAT TROPONIN, ED: Troponin i, poc: 0 ng/mL (ref 0.00–0.08)

## 2017-01-16 LAB — ECHOCARDIOGRAM COMPLETE

## 2017-01-16 MED ORDER — LOSARTAN POTASSIUM 50 MG PO TABS
75.0000 mg | ORAL_TABLET | ORAL | Status: DC
  Administered 2017-01-17: 75 mg via ORAL
  Filled 2017-01-16: qty 1

## 2017-01-16 MED ORDER — GI COCKTAIL ~~LOC~~: 30.0000 mL | Freq: Four times a day (QID) | ORAL | Status: DC | PRN

## 2017-01-16 MED ORDER — PRAVASTATIN SODIUM 40 MG PO TABS
40.0000 mg | ORAL_TABLET | Freq: Every day | ORAL | Status: DC
  Administered 2017-01-17: 40 mg via ORAL
  Filled 2017-01-16: qty 1

## 2017-01-16 MED ORDER — ZOLPIDEM TARTRATE 5 MG PO TABS: 5.0000 mg | ORAL_TABLET | Freq: Every evening | ORAL | Status: DC | PRN

## 2017-01-16 MED ORDER — ALPRAZOLAM 0.25 MG PO TABS: 0.2500 mg | ORAL_TABLET | Freq: Two times a day (BID) | ORAL | Status: DC | PRN

## 2017-01-16 MED ORDER — AMLODIPINE BESYLATE 5 MG PO TABS
10.0000 mg | ORAL_TABLET | Freq: Every day | ORAL | Status: DC
  Administered 2017-01-17: 10 mg via ORAL
  Filled 2017-01-16: qty 2

## 2017-01-16 MED ORDER — ASPIRIN 81 MG PO CHEW
81.0000 mg | CHEWABLE_TABLET | Freq: Every day | ORAL | Status: DC
  Administered 2017-01-17: 81 mg via ORAL
  Filled 2017-01-16: qty 1

## 2017-01-16 MED ORDER — PANTOPRAZOLE SODIUM 40 MG PO TBEC
40.0000 mg | DELAYED_RELEASE_TABLET | Freq: Two times a day (BID) | ORAL | Status: DC
Start: 2017-01-17 — End: 2017-01-17
  Administered 2017-01-17: 40 mg via ORAL
  Filled 2017-01-16: qty 1

## 2017-01-16 MED ORDER — ONDANSETRON HCL 4 MG/2ML IJ SOLN: 4.0000 mg | Freq: Four times a day (QID) | INTRAMUSCULAR | Status: DC | PRN

## 2017-01-16 MED ORDER — ENOXAPARIN SODIUM 40 MG/0.4ML ~~LOC~~ SOLN: 40.0000 mg | SUBCUTANEOUS | Status: DC

## 2017-01-16 MED ORDER — ACETAMINOPHEN 325 MG PO TABS: 650.0000 mg | ORAL_TABLET | ORAL | Status: DC | PRN

## 2017-01-16 MED ORDER — ASPIRIN 81 MG PO TABS: 81.0000 mg | ORAL_TABLET | Freq: Every day | ORAL | Status: DC

## 2017-01-16 MED ORDER — ENOXAPARIN SODIUM 40 MG/0.4ML ~~LOC~~ SOLN
40.0000 mg | SUBCUTANEOUS | Status: DC
  Filled 2017-01-16: qty 0.4

## 2017-01-16 MED ORDER — NITROGLYCERIN 2 % TD OINT
1.0000 [in_us] | TOPICAL_OINTMENT | Freq: Once | TRANSDERMAL | Status: AC
  Administered 2017-01-16: 1 [in_us] via TOPICAL
  Filled 2017-01-16: qty 1

## 2017-01-16 NOTE — H&P (Signed)
History and Physical    Daniel [name] [id number] DOB: 12-08-1961 DOA: 01/16/2017   PCP: Elsie Stain, MD   Patient coming from:  Home  Chief Complaint: Chest pain   HPI: Daniel [name] is a 56 y.o. male with medical history significant for HTN, HLD, presenting to the ER for evaluation of chest pain. On the day prior to admission: the patient was experiencing SSCP that  lasted a few minutes, and quickly resolved. However, the patient began to have intermittent shortness of breath with exertion, and while sitting he became somewhat lightheaded, without any chest pain event at the time. He then had an elevated another episode of shortness of breath, and decided to call his PCP. While on the phone, the patient develops some focal moderate left-sided chest pain, without radiation, substernal, sharp, and this episode was of longer duration, about 7 to 8 minutes, for which he sought medical attention at the ED. He was brought here by EMS. On transport, he received aspirin and nitroglycerin, with resolution of the symptoms. He did not have any other new episodes since then. He denies any fevers, chills, or sweats. He also denies any shortness of breath at rest. He denies any nausea vomiting or abdominal pain. His appetite is normal and eats salt rich foods. He denies any leg swelling or calf pain. He denies any headaches or vision changes. No seizures or confusion. He denies any long distance trips, new stressors, or new medicines. He is not on hormonal therapy or Viagra. He denies any new herbal supplements. He does not smoke. No alcohol or recreational drugs.  He had a similar presentation in early January, when he was seen at the ER, but he was discharge in the same day, as no abnormalities were noted. He was seen by cardiology in 2012, due to mild chest pain, with exertion, and his Lexiscan Myoview showed no ischemia or scar. He is EF was 41%. His last 2-D echo in 01/16/2012 showed LVH, with EF 6055  evidence to 123456, normal diastolic function.  ED Course:  BP 126/86   Pulse 72   Temp 98.1 F (36.7 C) (Oral)   Resp 13   SpO2 100%    CBC and CMET are normal. Glucose 107. EKG with sinus rhythm , no ACS, troponin negative at 0. D dimer 0.27. Patient received ASA 324 mg  and nitroglycerin in with resolution of the symptoms.  Review of Systems: As per HPI otherwise 10 point review of systems negative.   Past Medical History:  Diagnosis Date  . 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 Chest pain    12/12:  Lexiscan Myoview demonstrated no ischemia or scar.  His EF was 41%.; Echocardiogram 01/16/12: LVH, EF A999333, diastolic function normal, no wall motion abnormalities, moderate LAE, mild RVE   . Esophageal stricture 11/08/2013  . GERD (gastroesophageal reflux disease) 02-18-12   none in 5 months  . Heart murmur 02-18-12   as child, not now  . Hyperlipidemia   . Hypertension   . Mass of colon   . Tubular adenoma of colon 11/2011  . Villous adenoma of right colon 03/12/2012   Laparoscopic right colectomy 02/25/2012     Past Surgical History:  Procedure Laterality Date  . APPENDECTOMY  2013  . COLON RESECTION  02/25/2012   Procedure: LAPAROSCOPIC RIGHT COLON RESECTION;  Surgeon: Adin Hector, MD;  Location: [facility];  Service: General;  Laterality: Right;  laparoscopic assisted right colectomy  . COLONOSCOPY  11/21/11  . LAPAROSCOPIC RIGHT COLON RESECTION  2013   adenomatous polyp    Social History Social History   Social History  . Marital status: Married    Spouse name: N/A  . Number of children: 2  . Years of education: N/A   Occupational History  . [employer] Unemployed   Social History Main Topics  . Smoking status: Never Smoker  . Smokeless tobacco: Never Used  . Alcohol use No  . Drug use: No  . Sexual activity: Yes   Other Topics Concern  . Not on file   Social History  Narrative   Worked for [employer] until 2014   Divorced 20+ years   [employer]   1 son/1 daughter     Allergies  Allergen Reactions  . Oxycodone Other (See Comments)    syncope  . Lisinopril Cough    Family History  Problem Relation Age of Onset  . Heart disease Mother 14    MI, died at age 55  . Hypertension Mother   . Coronary artery disease Mother   . Alcohol abuse Father     cirrhoisis of liver-- ETOH abuse  . Heart disease Brother   . Colon cancer Neg Hx   . Prostate cancer Neg Hx   . Esophageal cancer Neg Hx   . Rectal cancer Neg Hx   . Stomach cancer Neg Hx       Prior to Admission medications   Medication Sig Start Date End Date Taking? Authorizing Provider  amLODipine (NORVASC) 10 MG tablet TAKE 1 TABLET BY MOUTH EVERY DAY FOR BLOOD PRESSURE 12/26/16   Tonia Ghent, MD  aspirin 81 MG tablet Take 81 mg by mouth daily.    Historical Provider, MD  losartan (COZAAR) 50 MG tablet Take 1.5 tablets (75 mg total) by mouth every morning. 12/22/15   Tonia Ghent, MD  Multiple Vitamin (MULITIVITAMIN WITH MINERALS) TABS Take 1 tablet by mouth every morning.    Historical Provider, MD  pantoprazole (PROTONIX) 40 MG tablet 1 tab 1-2 times a day 12/22/15   Tonia Ghent, MD  pravastatin (PRAVACHOL) 40 MG tablet TAKE 1 TABLET BY MOUTH DAILY. 12/26/16   Tonia Ghent, MD    Physical Exam:  Vitals:   01/16/17 1230 01/16/17 1245 01/16/17 1330 01/16/17 1345  BP: 123/80 124/78 128/83 126/86  Pulse: 68 72 72 72  Resp: 17 12 16 13   Temp:      TempSrc:      SpO2: 100% 100% 99% 100%   Constitutional: NAD, calm, comfortable  Eyes: PERRL, lids and conjunctivae normal ENMT: Mucous membranes are moist, without exudate or lesions  Neck: normal, supple, no masses, no thyromegaly Respiratory: clear to auscultation bilaterally, no wheezing, no crackles. Normal respiratory effort  Cardiovascular: Regular rate and rhythm, no murmurs / rubs / gallops. No extremity  edema. 2+ pedal pulses. No carotid bruits.  Abdomen: Soft, mildly protuberant  non tender, No hepatosplenomegaly. Bowel sounds positive.  Musculoskeletal: no clubbing / cyanosis. Moves all extremities Skin: no jaundice, No lesions.  Neurologic: Sensation intact  Strength normal  Psychiatric:   Alert and oriented x 3. Normal mood.     Labs on Admission: I have personally reviewed following labs and imaging studies  CBC:  Recent Labs Lab 01/16/17 1127  WBC 5.5  HGB 13.4  HCT 42.4  MCV 80.0  PLT 225    Basic  Metabolic Panel:  Recent Labs Lab 01/16/17 1127  NA 139  K 4.5  CL 101  CO2 29  GLUCOSE 107*  BUN 12  CREATININE 1.09  CALCIUM 9.1    GFR: CrCl cannot be calculated (Unknown ideal weight.).  Liver Function Tests: No results for input(s): AST, ALT, ALKPHOS, BILITOT, PROT, ALBUMIN in the last 168 hours. No results for input(s): LIPASE, AMYLASE in the last 168 hours. No results for input(s): AMMONIA in the last 168 hours.  Coagulation Profile: No results for input(s): INR, PROTIME in the last 168 hours.  Cardiac Enzymes: No results for input(s): CKTOTAL, CKMB, CKMBINDEX, TROPONINI in the last 168 hours.  BNP (last 3 results) No results for input(s): PROBNP in the last 8760 hours.  HbA1C: No results for input(s): HGBA1C in the last 72 hours.  CBG: No results for input(s): GLUCAP in the last 168 hours.  Lipid Profile: No results for input(s): CHOL, HDL, LDLCALC, TRIG, CHOLHDL, LDLDIRECT in the last 72 hours.  Thyroid Function Tests: No results for input(s): TSH, T4TOTAL, FREET4, T3FREE, THYROIDAB in the last 72 hours.  Anemia Panel: No results for input(s): VITAMINB12, FOLATE, FERRITIN, TIBC, IRON, RETICCTPCT in the last 72 hours.  Urine analysis:    Component Value Date/Time   COLORURINE YELLOW 02/18/2012 [place] 02/18/2012 1422   LABSPEC 1.020 02/18/2012 1422   PHURINE 7.5 02/18/2012 1422   GLUCOSEU NEGATIVE 02/18/2012 1422     HGBUR NEGATIVE 02/18/2012 1422   BILIRUBINUR NEGATIVE 02/18/2012 1422   KETONESUR NEGATIVE 02/18/2012 1422   PROTEINUR NEGATIVE 02/18/2012 1422   UROBILINOGEN 0.2 02/18/2012 1422   NITRITE NEGATIVE 02/18/2012 1422   LEUKOCYTESUR NEGATIVE 02/18/2012 1422    Sepsis Labs: @LABRCNTIP (procalcitonin:4,lacticidven:4) )No results found for this or any previous visit (from the past 240 hour(s)).   Radiological Exams on Admission: Dg Chest 2 View  Result Date: 01/16/2017 CLINICAL DATA:  Shortness of Breath starting yesterday.  Chest pain. EXAM: CHEST  2 VIEW COMPARISON:  12/18/2015 FINDINGS: Low lung volumes are present, causing crowding of the pulmonary vasculature. The lungs appear clear.  Cardiac and mediastinal contours normal. No pleural effusion identified. IMPRESSION: 1.  No active cardiopulmonary disease is radiographically apparent. 2. Low lung volumes. Electronically Signed   By: Van Clines M.D.   On: 01/16/2017 11:22    EKG: Independently reviewed.  Assessment/Plan Active Problems:   Chest pain   HYPERLIPIDEMIA   Obesity   Hypertension   Allergic rhinitis   GERD (gastroesophageal reflux disease)   Sciatica   Chest pain syndrome  HEART score 3-4 . Troponin 0 , EKG without evidence of ACS. CP relieved by nitroglycerin, aspirin. CXR unrevealing Lexiscan Myoview showed no ischemia or scar. EF was 41%. His last 2-D echo in 01/16/2012 showed LVH, with EF 55to 123456, normal diastolic function. RF include Age, HTN, HLD, obesity. CBC and CMET normal   Admit to Telemetry/ Observation Chest pain order set Cycle troponins EKG in am continue ASA, O2 and NTG as needed GI cocktail Check Lipid panel  2 D echo He may need Stress test as outpatient   GERD/ history of esophageal stricture  Continue PPI and GI cocktail prn   Hypertension BP 126/86   Pulse 72   Temp 98.1 F (36.7 C) (Oral)   Resp 13   SpO2 100%  Controlled Continue home anti-hypertensive medications     Hyperlipidemia Continue home statins Check lipid panel   DVT prophylaxis: Lovenox  Code Status:   Full  Family Communication:  Discussed with patient Disposition Plan: Expect patient to be discharged to home after condition improves Consults called:    None Admission status:Tele  Obs   Blakelynn Scheeler E, PA-C [facility]   01/16/2017, 2:47 PM

## 2017-01-16 NOTE — Telephone Encounter (Signed)
Thanks Agree 

## 2017-01-16 NOTE — ED Notes (Signed)
Family at bedside. No needs.  

## 2017-01-16 NOTE — Telephone Encounter (Signed)
I spoke with Mrs Amon and she took pt to fire dept and fire dept has called 911 to come pick up pt and take to [facility] ED. FYI to Dr Damita Dunnings.

## 2017-01-16 NOTE — ED Notes (Signed)
Pt returned from xray

## 2017-01-16 NOTE — ED Notes (Signed)
Patient transported to X-ray 

## 2017-01-16 NOTE — ED Notes (Signed)
ED Provider at bedside. 

## 2017-01-16 NOTE — Telephone Encounter (Signed)
Patient Name: Daniel [name]  DOB: Sep 02, 1961    Initial Comment Caller states having some shortness of breath and some light headedness    Nurse Assessment  Nurse: Raphael Gibney, RN, Vera Date/Time (Eastern Time): 01/16/2017 9:18:28 AM  Confirm and document reason for call. If symptomatic, describe symptoms. ---Caller states he is light headed. Having chest pain and SOB. Pain in the center of chest.  Does the patient have any new or worsening symptoms? ---Yes  Will a triage be completed? ---Yes  Related visit to physician within the last 2 weeks? ---No  Does the PT have any chronic conditions? (i.e. diabetes, asthma, etc.) ---Yes  List chronic conditions. ---HTN  Is this a behavioral health or substance abuse call? ---No     Guidelines    Guideline Title Affirmed Question Affirmed Notes  Chest Pain [1] Chest pain lasts > 5 minutes AND [2] age > 91    Final Disposition User   Call EMS 911 Now Raphael Gibney, RN, Vanita Ingles    Comments  pt states he is not going to call 911 but his wife is on the way to take him to the ER   Referrals  [facility] - ED   Disagree/Comply: Disagree  Disagree/Comply Reason: Disagree with instructions

## 2017-01-16 NOTE — ED Provider Notes (Signed)
Emergency Department Provider Note   I have reviewed the triage vital signs and the nursing notes.   HISTORY  Chief Complaint Chest Pain   HPI Daniel [name] is a 56 y.o. male with PMH of GERD, esophageal stricture, HLD, and HTN presents to the emergency department for evaluation of sudden onset chest pain and some associated difficult to breathing. Patient states he had some shortness of breath yesterday without pain that resolved spontaneously. This morning he was sitting in a restaurant waiting on breakfast when he became suddenly lightheaded. He denies any chest pain or shortness of breath during this episode. It resolved spontaneously. On the walk home he noticed some difficulty breathing and so called his primary care physician. While on the phone with them he developed some focal moderate left-sided chest pain. Denies radiation. States that it feels sharp with no exacerbating or alleviating factors. Patient received ASA and Nitro with EMS. Pain remains.    Past Medical History:  Diagnosis Date  . 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 Chest pain    12/12:  Lexiscan Myoview demonstrated no ischemia or scar.  His EF was 41%.; Echocardiogram 01/16/12: LVH, EF [id number], diastolic function normal, no wall motion abnormalities, moderate LAE, mild RVE   . Esophageal stricture 11/08/2013  . GERD (gastroesophageal reflux disease) 02-18-12   none in 5 months  . Heart murmur 02-18-12   as child, not now  . Hyperlipidemia   . Hypertension   . Mass of colon   . Tubular adenoma of colon 11/2011  . Villous adenoma of right colon 03/12/2012   Laparoscopic right colectomy 02/25/2012     Patient Active Problem List   Diagnosis Date Noted  . Advance care planning 12/25/2015  . Sciatica 08/09/2014  . Esophageal stricture 11/08/2013  . Syncope 12/06/2011  . Chest pain 11/28/2011  . Routine general medical  examination at a health care facility 10/18/2011  . Personal history of adenomatous colonic polyps 10/18/2011  . GERD (gastroesophageal reflux disease) 06/23/2011  . Allergic rhinitis 05/25/2009  . Obesity 04/27/2009  . ERECTILE DYSFUNCTION 05/12/2008  . HYPERLIPIDEMIA 12/31/2007  . Hypertension 11/27/2007    Past Surgical History:  Procedure Laterality Date  . APPENDECTOMY  2013  . COLON RESECTION  02/25/2012   Procedure: LAPAROSCOPIC RIGHT COLON RESECTION;  Surgeon: Adin Hector, MD;  Location: [facility];  Service: General;  Laterality: Right;  laparoscopic assisted right colectomy  . COLONOSCOPY  11/21/11  . LAPAROSCOPIC RIGHT COLON RESECTION  2013   adenomatous polyp    Current Outpatient Rx  . Order #: [id number] Class: Normal  . Order #: [id number] Class: Historical Med  . Order #: [id number] Class: Normal  . Order #: [id number] Class: Historical Med  . Order #: [id number] Class: Normal  . Order #: [id number] Class: Normal    Allergies Oxycodone and Lisinopril  Family History  Problem Relation Age of Onset  . Heart disease Mother 35    MI, died at age 68  . Hypertension Mother   . Coronary artery disease Mother   . Alcohol abuse Father     cirrhoisis of liver-- ETOH abuse  . Heart disease Brother   . Colon cancer Neg Hx   . Prostate cancer Neg Hx   . Esophageal cancer Neg Hx   . Rectal cancer Neg Hx   . Stomach cancer Neg Hx     Social History Social History  Substance Use Topics  .  Smoking status: Never Smoker  . Smokeless tobacco: Never Used  . Alcohol use No    Review of Systems  Constitutional: No fever/chills Eyes: No visual changes. ENT: No sore throat. Cardiovascular: Positive hest pain. Respiratory: Positive shortness of breath. Gastrointestinal: No abdominal pain.  No nausea, no vomiting.  No diarrhea.  No constipation. Genitourinary: Negative for dysuria. Musculoskeletal: Negative for back pain. Skin: Negative for rash. Neurological: Negative for  headaches, focal weakness or numbness.  10-point ROS otherwise negative.  ____________________________________________   PHYSICAL EXAM:  VITAL SIGNS: ED Triage Vitals  Enc Vitals Group     BP 01/16/17 1042 137/94     Pulse Rate 01/16/17 1042 84     Resp 01/16/17 1042 20     Temp 01/16/17 1042 98.1 F (36.7 C)     Temp Source 01/16/17 1042 Oral     SpO2 01/16/17 1042 100 %     Pain Score 01/16/17 1037 2   Constitutional: Alert and oriented. Well appearing and in no acute distress. Eyes: Conjunctivae are normal.  Head: Atraumatic. Nose: No congestion/rhinnorhea. Mouth/Throat: Mucous membranes are moist.  Oropharynx non-erythematous. Neck: No stridor.  Cardiovascular: Normal rate, regular rhythm. Good peripheral circulation. Grossly normal heart sounds.   Respiratory: Normal respiratory effort.  No retractions. Lungs CTAB. Gastrointestinal: Soft and nontender. No distention.  Musculoskeletal: No lower extremity tenderness nor edema. No gross deformities of extremities. Neurologic:  Normal speech and language. No gross focal neurologic deficits are appreciated.  Skin:  Skin is warm, dry and intact. No rash noted. ____________________________________________   LABS (all labs ordered are listed, but only abnormal results are displayed)  Labs Reviewed  BASIC METABOLIC PANEL - Abnormal; Notable for the following:       Result Value   Glucose, Bld 107 (*)    All other components within normal limits  CBC - Abnormal; Notable for the following:    MCH 25.3 (*)    All other components within normal limits  D-DIMER, QUANTITATIVE (NOT AT [facility])  TROPONIN I  TROPONIN I  TROPONIN I  I-STAT TROPOININ, ED   ____________________________________________  EKG   EKG Interpretation  Date/Time:  Thursday January 16 2017 10:39:52 EST Ventricular Rate:  89 PR Interval:    QRS Duration: 96 QT Interval:  325 QTC Calculation: 396 R Axis:   77 Text Interpretation:  Sinus rhythm  Probable left atrial enlargement Borderline repolarization abnormality Similar to Jan 2nd tracing. No STEMI.  Confirmed by Adrin Julian MD, Nicolina Hirt [phone]) on 01/16/2017 10:56:42 AM       ____________________________________________  RADIOLOGY  Dg Chest 2 View  Result Date: 01/16/2017 CLINICAL DATA:  Shortness of Breath starting yesterday.  Chest pain. EXAM: CHEST  2 VIEW COMPARISON:  12/18/2015 FINDINGS: Low lung volumes are present, causing crowding of the pulmonary vasculature. The lungs appear clear.  Cardiac and mediastinal contours normal. No pleural effusion identified. IMPRESSION: 1.  No active cardiopulmonary disease is radiographically apparent. 2. Low lung volumes. Electronically Signed   By: Van Clines M.D.   On: 01/16/2017 11:22    ____________________________________________   PROCEDURES  Procedure(s) performed:   Procedures  None ____________________________________________   INITIAL IMPRESSION / ASSESSMENT AND PLAN / ED COURSE  Pertinent labs & imaging results that were available during my care of the patient were reviewed by me and considered in my medical decision making (see chart for details).  Patient resents to the emergency department for evaluation of chest pain, dyspnea, lightheadedness. Many of these symptoms occurred independent of  one another the patient is currently experiencing chest pain and some mild dyspnea. He was placed on oxygen in the field with mild hypoxemia. Low risk for pulmonary embolism but this is a consideration. EKG looks similar to January 2nd tracing. Plan for labs, chest x-ray, biomarkers.   D-dimer negative.   Discussed patient's case with hospitalist, Dr. Aggie Moats' team.  Recommend admission to tele, obs bed.  I will place holding orders per their request. Patient and family (if present) updated with plan. Care transferred to hospitalist service.  I reviewed all nursing notes, vitals, pertinent old records, EKGs, labs, imaging (as  available).  ____________________________________________  FINAL CLINICAL IMPRESSION(S) / ED DIAGNOSES  Final diagnoses:  Nonspecific chest pain     MEDICATIONS GIVEN DURING THIS VISIT:  Medications  amLODipine (NORVASC) tablet 10 mg (not administered)  pravastatin (PRAVACHOL) tablet 40 mg (not administered)  losartan (COZAAR) tablet 75 mg (not administered)  pantoprazole (PROTONIX) EC tablet 40 mg (not administered)  aspirin tablet 81 mg (not administered)  acetaminophen (TYLENOL) tablet 650 mg (not administered)  ondansetron (ZOFRAN) injection 4 mg (not administered)  enoxaparin (LOVENOX) injection 40 mg (not administered)  gi cocktail (Maalox,Lidocaine,Donnatal) (not administered)  ALPRAZolam (XANAX) tablet 0.25 mg (not administered)  zolpidem (AMBIEN) tablet 5 mg (not administered)  nitroGLYCERIN (NITROGLYN) 2 % ointment 1 inch (1 inch Topical Given 01/16/17 1149)     NEW OUTPATIENT MEDICATIONS STARTED DURING THIS VISIT:  None   Note:  This document was prepared using Dragon voice recognition software and may include unintentional dictation errors.  Nanda Quinton, MD Emergency Medicine   Margette Fast, MD 01/16/17 1435

## 2017-01-16 NOTE — ED Triage Notes (Signed)
Per EMS- pt reports chest pain yesterday that resolved. Pt reports that he had SOB and left sided chest pain that started again today. Pt received 324mg  of Asprin and 2 nitro with no relief. Sharp pain in chest . 20g in left [facility]

## 2017-01-16 NOTE — Progress Notes (Signed)
2D Echocardiogram has been performed.  Aggie Cosier 01/16/2017, 4:13 PM

## 2017-01-17 DIAGNOSIS — E669 Obesity, unspecified: Secondary | ICD-10-CM | POA: Diagnosis not present

## 2017-01-17 DIAGNOSIS — K219 Gastro-esophageal reflux disease without esophagitis: Secondary | ICD-10-CM | POA: Diagnosis not present

## 2017-01-17 DIAGNOSIS — R079 Chest pain, unspecified: Secondary | ICD-10-CM | POA: Diagnosis not present

## 2017-01-17 DIAGNOSIS — I10 Essential (primary) hypertension: Secondary | ICD-10-CM | POA: Diagnosis not present

## 2017-01-17 DIAGNOSIS — Z6834 Body mass index (BMI) 34.0-34.9, adult: Secondary | ICD-10-CM | POA: Diagnosis not present

## 2017-01-17 NOTE — Progress Notes (Signed)
Paged MD regarding pt plan of care. Pt anxiously waiting information. Discharge vs stress test? Pt states he can complete stress test outpatient. MD returned page, read 2D echo to her. She stated she would call cardiology for consult and call back in 30 minutes   Daniel [name]

## 2017-01-17 NOTE — Discharge Instructions (Signed)
Acute Pain, Adult °Acute pain is a type of pain that may last for just a few days or as long as six months. It is often related to an illness, injury, or medical procedure. Acute pain may be mild, moderate, or severe. It usually goes away once your injury has healed or you are no longer ill. °Pain can make it hard for you to do daily activities. It can cause anxiety and lead to other problems if left untreated. Treatment depends on the cause and severity of your acute pain. °Follow these instructions at home: °· Check your pain level as told by your health care provider. °· Take over-the-counter and prescription medicines only as told by your health care provider. °· If you are taking prescription pain medicine: °¨ Ask your health care provider about taking a stool softener or laxative to prevent constipation. °¨ Do not stop taking the medicine suddenly. Talk to your health care provider about how and when to discontinue prescription pain medicine. °¨ If your pain is severe, do not take more pills than instructed by your health care provider. °¨ Do not take other over-the-counter pain medicines in addition to this medicine unless told by your health care provider. °¨ Do not drive or operate heavy machinery while taking prescription pain medicine. °· Apply ice or heat as told by your health care provider. These may reduce swelling and pain. °· Ask your health care provider if other strategies such as distraction, relaxation, or physical therapies can help your pain. °· Keep all follow-up visits as told by your health care provider. This is important. °Contact a health care provider if: °· You have pain that is not controlled by medicine. °· Your pain does not improve or gets worse. °· You have side effects from pain medicines, such as vomiting or confusion. °Get help right away if: °· You have severe pain. °· You have trouble breathing. °· You lose consciousness. °· You have chest pain or pressure that lasts for more  than a few minutes. Along with the chest pain you may: °¨ Have pain or discomfort in one or both arms, your back, neck, jaw, or stomach. °¨ Have shortness of breath. °¨ Break out in a cold sweat. °¨ Feel nauseous. °¨ Become light-headed. °These symptoms may represent a serious problem that is an emergency. Do not wait to see if the symptoms will go away. Get medical help right away. Call your local emergency services (911 in the [place] Do not drive yourself to the hospital.  °This information is not intended to replace advice given to you by your health care provider. Make sure you discuss any questions you have with your health care provider. °Document Released: 12/17/2015 Document Revised: 05/10/2016 Document Reviewed: 12/17/2015 °Elsevier Interactive Patient Education © 2017 [employer] ° °

## 2017-01-17 NOTE — Progress Notes (Addendum)
Paged MD regarding pt order EKG q 8 hours. Pt stable, nonsymptomatic. Awaiting call back  Nilda Simmer   MD returned page, verbal to discontinue EKG q 8 hours.

## 2017-01-17 NOTE — Progress Notes (Signed)
MD called back stated cardiologist read over pt chart and stated it is safe to complete stress test out patient, therefore pt can be discharged. MD stated someone will be in contact with the patient to schedule the stress test. MD placed discharge orders that provided MD phone number incase of pt experiencing chest pain she can direct admit. Pt IV discontinued, catheter intact and telemetry box removed. Pt discharged with all belongings and discharge paperwork. Pt refused wheelchair for discharge transportation and walked out with wife. Pt understood risks.  Daniel [name]

## 2017-01-17 NOTE — Discharge Summary (Signed)
Physician Discharge Summary  Daniel [name] [id number] DOB: 08/12/1961 DOA: 01/16/2017  PCP: Elsie Stain, MD  Admit date: 01/16/2017 Discharge date: 01/17/2017  Recommendations for Outpatient Follow-up:  1. Pt will need to follow up with PCP in 2-3 weeks post discharge 2. Please obtain BMP to evaluate electrolytes and kidney function 3. Please also check CBC to evaluate Hg and Hct levels 4. Pt will be called by cardiology for an appointment time and date for stress test   Discharge Diagnoses:  Active Problems:   HYPERLIPIDEMIA   Obesity   Hypertension   Allergic rhinitis   GERD (gastroesophageal reflux disease)   Chest pain   Sciatica  Discharge Condition: Stable  Diet recommendation: Heart healthy diet discussed in details   History of present illness:  56 y.o. male with medical history significant for HTN, HLD, presenting to the ER for evaluation of chest pain. On the day prior to admission: the patient was experiencing SSCP that  lasted a few minutes, and quickly resolved. However, the patient began to have intermittent shortness of breath with exertion, and while sitting he became somewhat lightheaded, without any chest pain event at the time. He then had an elevated another episode of shortness of breath, and decided to call his PCP. While on the phone, the patient developed some focal moderate left-sided chest pain, without radiation, substernal, sharp, and this episode was of longer duration, about 7 to 8 minutes, for which he sought medical attention at the ED.   He was seen by cardiology in 2012, due to mild chest pain, with exertion, and his Lexiscan Myoview showed no ischemia or scar. He is EF was 41%. His last 2-D echo in 01/16/2012 showed LVH, with EF 123456, normal diastolic function.  Hospital Course:  Active Problems:   Chest pain  - resolved by this AM and pt says he would like to go home - spoke with cardiology on call, reviewed EKG  - CE's negative x 3, Ok for  outpatient stress test, this will be arranged by cardiology office and pt will be notified     Obesity - Body mass index is 34.87 kg/m.    Hypertension, essential  - continue Norvasc and Losartan   Procedures/Studies: Dg Chest 2 View  Result Date: 01/16/2017 CLINICAL DATA:  Shortness of Breath starting yesterday.  Chest pain. EXAM: CHEST  2 VIEW COMPARISON:  12/18/2015 FINDINGS: Low lung volumes are present, causing crowding of the pulmonary vasculature. The lungs appear clear.  Cardiac and mediastinal contours normal. No pleural effusion identified. IMPRESSION: 1.  No active cardiopulmonary disease is radiographically apparent. 2. Low lung volumes. Electronically Signed   By: Van Clines M.D.   On: 01/16/2017 11:22     Vitals:   01/17/17 0841 01/17/17 1543  BP: (!) 147/83 (!) 151/81  Pulse: 76 73  Resp: 18 18  Temp:  98.5 F (36.9 C)   Vitals:   01/17/17 0605 01/17/17 0833 01/17/17 0841 01/17/17 1543  BP: 128/78 (!) 163/98 (!) 147/83 (!) 151/81  Pulse: 68 72 76 73  Resp: 16 18 18 18   Temp: 97.5 F (36.4 C) 98.3 F (36.8 C)  98.5 F (36.9 C)  TempSrc: Oral   Oral  SpO2: 99% 100% 100% 100%  Weight: 116.6 kg (257 lb 1.6 oz)     Height:        General: Pt is alert, follows commands appropriately, not in acute distress Cardiovascular: Regular rate and rhythm, S1/S2 +, no rubs, no gallops Respiratory: Clear to  auscultation bilaterally, no wheezing, no crackles, no rhonchi Abdominal: Soft, non tender, non distended, bowel sounds +, no guarding  Discharge Instructions   Allergies as of 01/17/2017      Reactions   Oxycodone Other (See Comments)   syncope   Lisinopril Cough      Medication List    TAKE these medications   amLODipine 10 MG tablet Commonly known as:  NORVASC TAKE 1 TABLET BY MOUTH EVERY DAY FOR BLOOD PRESSURE   aspirin 81 MG tablet Take 81 mg by mouth daily.   losartan 50 MG tablet Commonly known as:  COZAAR Take 1.5 tablets (75 mg total) by  mouth every morning.   pantoprazole 40 MG tablet Commonly known as:  PROTONIX 1 tab 1-2 times a day What changed:  how much to take  how to take this  when to take this  additional instructions   pravastatin 40 MG tablet Commonly known as:  PRAVACHOL TAKE 1 TABLET BY MOUTH DAILY.         The results of significant diagnostics from this hospitalization (including imaging, microbiology, ancillary and laboratory) are listed below for reference.     Microbiology: No results found for this or any previous visit (from the past 240 hour(s)).   Labs: Basic Metabolic Panel:  Recent Labs Lab 01/16/17 1127  NA 139  K 4.5  CL 101  CO2 29  GLUCOSE 107*  BUN 12  CREATININE 1.09  CALCIUM 9.1   CBC:  Recent Labs Lab 01/16/17 1127  WBC 5.5  HGB 13.4  HCT 42.4  MCV 80.0  PLT 225   Cardiac Enzymes:  Recent Labs Lab 01/16/17 1529 01/16/17 1632 01/16/17 1948  TROPONINI <0.03 <0.03 <0.03   SIGNED: Time coordinating discharge: 30 minutes  Faye Ramsay, MD  [facility] 01/17/2017, 3:48 PM Pager [phone]  If 7PM-7AM, please contact night-coverage [email or web address] Password [email or web address]

## 2017-01-20 ENCOUNTER — Telehealth: Payer: Self-pay

## 2017-01-20 NOTE — Telephone Encounter (Signed)
Transition Care Management Follow-up Telephone Call  Date discharged?   01/17/17 How have you been since you were released from the hospital? Doing pretty good.  Do you understand why you were in the hospital?  Well, I know that I was having chest pain and some shortness of breath but they couldn't find any reason for it and sent me home.  I think I may have just pulled a muscle.   Do you understand the discharge instructions? Yes, I just need to wait for a call to set up my stress test.   Where were you discharged to? Home   Items Reviewed:  Medications reviewed: Yes  Allergies reviewed: Yes  Dietary changes reviewed: Yes Heart healthy diet, patient has no questions.  Referrals reviewed: Yes, cardiology is to set up a stress test and appointment made today to see PCP Dr. Damita Dunnings on 01/23/17.   Functional Questionnaire:   Activities of Daily Living (ADLs):   He states that he is independent will all activities of daily living. States they do not require any assistance.   Any transportation issues/concerns?: No, he is independent with transportation.   Any patient concerns? No   Confirmed importance and date/time of follow-up visits scheduled Yes, if patient has not heard from Cardiology office by appointment with PCP on 01/23/17 then we will assist him in connecting for follow up from here.  Provider Appointment booked with Dr. Damita Dunnings on 01/23/17 at 10:45am.    Confirmed with patient if condition begins to worsen call PCP or go to the ER.  Patient was given the office number and encouraged to call back with question or concerns.  : Yes.

## 2017-01-23 ENCOUNTER — Ambulatory Visit (INDEPENDENT_AMBULATORY_CARE_PROVIDER_SITE_OTHER): Payer: Commercial Managed Care - HMO | Admitting: Family Medicine

## 2017-01-23 ENCOUNTER — Encounter: Payer: Self-pay | Admitting: Family Medicine

## 2017-01-23 VITALS — BP 156/92 | HR 68 | Temp 98.5°F | Wt 262.5 lb

## 2017-01-23 DIAGNOSIS — R0789 Other chest pain: Secondary | ICD-10-CM

## 2017-01-23 DIAGNOSIS — I10 Essential (primary) hypertension: Secondary | ICD-10-CM

## 2017-01-23 NOTE — Assessment & Plan Note (Signed)
No sx now.  Still needs risk factor reduction and tx for LVH with weight reduction and current meds.  D/w pt.  He agrees.   CP dx d/w pt.  Patient thought he could have had a muscle spasm.  No typical heartburn sx.  Still on PPI.   Still reasonable to get outpatient stress testing done with cards.   Referral ordered.  D/w pt.  He agrees.   He'll update me as needed.   Okay for outpatient f/u.

## 2017-01-23 NOTE — Patient Instructions (Signed)
Don't change your meds for now.  Take care.  Glad to see you.  Rosaria Ferries will call about your referral.  See her on the way out.

## 2017-01-23 NOTE — Progress Notes (Signed)
Admit date: 01/16/2017 Discharge date: 01/17/2017  Recommendations for Outpatient Follow-up:  1. Pt will need to follow up with PCP in 2-3 weeks post discharge 2. Please obtain BMP to evaluate electrolytes and kidney function 3. Please also check CBC to evaluate Hg and Hct levels 4. Pt will be called by cardiology for an appointment time and date for stress test   Discharge Diagnoses:  Active Problems:   HYPERLIPIDEMIA   Obesity   Hypertension   Allergic rhinitis   GERD (gastroesophageal reflux disease)   Chest pain   Sciatica  Discharge Condition: Stable  Diet recommendation: Heart healthy diet discussed in details   History of present illness:  56 y.o.malewith medical history significant for HTN, HLD, presenting to the ER for evaluation of chest pain. On the day prior to admission: the patient was experiencing SSCP that lasted a few minutes, and quickly resolved. However, the patient began to have intermittent shortness of breath with exertion, and while sitting he became somewhat lightheaded, without any chest pain event at the time. He then had an elevated another episode of shortness of breath, and decided to call his PCP. While on the phone, the patient developed some focal moderate left-sided chest pain, without radiation, substernal, sharp, and this episode was of longer duration, about 7 to 8 minutes, for which he sought medical attention at the ED.   He was seen by cardiology in 2012, due to mild chest pain, with exertion, and his Lexiscan Myoview showed no ischemia or scar. He is EF was 41%. His last 2-D echo in 01/16/2012 showed LVH, with EF 123456, normal diastolic function.  Hospital Course:  Active Problems:   Chest pain  - resolved by this AM and pt says he would like to go home - spoke with cardiology on call, reviewed EKG  - CE's negative x 3, Ok for outpatient stress test, this will be arranged by cardiology office and pt will be notified     Obesity - Body  mass index is 34.87 kg/m.    Hypertension, essential  - continue Norvasc and Losartan   ============================================ The above d/w pt. Admitted with CP, SSCP initially then some L sided CP.  CE neg x3, with echo with normal LV systolic function; mild LVH; grade 1 diastolic dysfunction; mild LAE.  Was able to be discharged home.    D/w pt about prev labs.  We don't need to recheck Cr or CBC today, both okay recently.    No sx since leaving hospital.  No CP, SOB, BLE edema.  Was able to work outside earlier this week w/o troubles.  Still on baseline meds for HTN.    D/w pt about weight loss, with plan to [facility] when done with cardiac eval.    PMH and SH reviewed  ROS: Per HPI unless specifically indicated in ROS section   Meds, vitals, and allergies reviewed.   GEN: nad, alert and oriented HEENT: mucous membranes moist NECK: supple w/o LA CV: rrr.  no murmur PULM: ctab, no inc wob ABD: soft, +bs EXT: no edema

## 2017-01-23 NOTE — Progress Notes (Signed)
Pre visit review using our clinic review tool, if applicable. No additional management support is needed unless otherwise documented below in the visit note. 

## 2017-01-30 ENCOUNTER — Encounter: Payer: Self-pay | Admitting: Cardiology

## 2017-01-30 ENCOUNTER — Ambulatory Visit (INDEPENDENT_AMBULATORY_CARE_PROVIDER_SITE_OTHER): Payer: Commercial Managed Care - HMO | Admitting: Cardiology

## 2017-01-30 VITALS — BP 138/92 | HR 74 | Ht 72.0 in | Wt 260.2 lb

## 2017-01-30 DIAGNOSIS — I10 Essential (primary) hypertension: Secondary | ICD-10-CM | POA: Diagnosis not present

## 2017-01-30 DIAGNOSIS — R0789 Other chest pain: Secondary | ICD-10-CM

## 2017-01-30 DIAGNOSIS — E6609 Other obesity due to excess calories: Secondary | ICD-10-CM

## 2017-01-30 DIAGNOSIS — Z6835 Body mass index (BMI) 35.0-35.9, adult: Secondary | ICD-10-CM | POA: Diagnosis not present

## 2017-01-30 DIAGNOSIS — E782 Mixed hyperlipidemia: Secondary | ICD-10-CM | POA: Diagnosis not present

## 2017-01-30 NOTE — Patient Instructions (Addendum)
Testing/Procedures: [place]  Your caregiver has ordered a Stress Test with nuclear imaging. The purpose of this test is to evaluate the blood supply to your heart muscle. This procedure is referred to as a "Non-Invasive Stress Test." This is because other than having an IV started in your vein, nothing is inserted or "invades" your body. Cardiac stress tests are done to find areas of poor blood flow to the heart by determining the extent of coronary artery disease (CAD). Some patients exercise on a treadmill, which naturally increases the blood flow to your heart, while others who are  unable to walk on a treadmill due to physical limitations have a pharmacologic/chemical stress agent called Lexiscan . This medicine will mimic walking on a treadmill by temporarily increasing your coronary blood flow.   Please note: these test may take anywhere between 2-4 hours to complete  PLEASE REPORT TO [place] AT THE FIRST DESK WILL DIRECT YOU WHERE TO GO  Date of Procedure:_Thursday February 06, 2017 at 07:30AM__  Arrival Time for Procedure:_Arrive at 07:15AM to register___   PLEASE NOTIFY THE OFFICE AT LEAST 24 HOURS IN ADVANCE IF YOU ARE UNABLE TO [place].  [phone] AND  PLEASE NOTIFY NUCLEAR MEDICINE AT [facility] AT LEAST 24 HOURS IN ADVANCE IF YOU ARE UNABLE TO KEEP YOUR APPOINTMENT. [phone]  How to prepare for your Myoview test:  1. Do not eat or drink after midnight 2. No caffeine for 24 hours prior to test 3. No smoking 24 hours prior to test. 4. Your medication may be taken with water.  If your doctor stopped a medication because of this test, do not take that medication. 5. Ladies, please do not wear dresses.  Skirts or pants are appropriate. Please wear a short sleeve shirt. 6. No perfume, cologne or lotion. 7. Wear comfortable walking shoes. No heels!   Follow-Up: Your physician recommends that you schedule a follow-up  appointment as needed with Dr. Yvone Neu. We will call you with results and   It was a pleasure seeing you today here in the office. Please do not hesitate to give [place] a call back if you have any further questions. [place], BSN     Pharmacologic Stress Electrocardiogram A pharmacologic stress electrocardiogram is a heart (cardiac) test that uses nuclear imaging to evaluate the blood supply to your heart. This test may also be called a pharmacologic stress electrocardiography. Pharmacologic means that a medicine is used to increase your heart rate and blood pressure.  This stress test is done to find areas of poor blood flow to the heart by determining the extent of coronary artery disease (CAD). Some people exercise on a treadmill, which naturally increases the blood flow to the heart. For those people unable to exercise on a treadmill, a medicine is used. This medicine stimulates your heart and will cause your heart to beat harder and more quickly, as if you were exercising.  Pharmacologic stress tests can help determine:  The adequacy of blood flow to your heart during increased levels of activity in order to clear you for discharge home.  The extent of coronary artery blockage caused by CAD.  Your prognosis if you have suffered a heart attack.  The effectiveness of cardiac procedures done, such as an angioplasty, which can increase the circulation in your coronary arteries.  Causes of chest pain or pressure. LET [facility] CARE PROVIDER KNOW ABOUT:  Any allergies you have.  All  medicines you are taking, including vitamins, herbs, eye drops, creams, and over-the-counter medicines.  Previous problems you or members of your family have had with the use of anesthetics.  Any blood disorders you have.  Previous surgeries you have had.  Medical conditions you have.  Possibility of pregnancy, if this applies.  If you are currently breastfeeding. RISKS AND  COMPLICATIONS Generally, this is a safe procedure. However, as with any procedure, complications can occur. Possible complications include:  You develop pain or pressure in the following areas:  Chest.  Jaw or neck.  Between your shoulder blades.  Radiating down your left arm.  Headache.  Dizziness or light-headedness.  Shortness of breath.  Increased or irregular heartbeat.  Low blood pressure.  Nausea or vomiting.  Flushing.  Redness going up the arm and slight pain during injection of medicine.  Heart attack (rare). BEFORE THE PROCEDURE   Avoid all forms of caffeine for 24 hours before your test or as directed by your health care provider. This includes coffee, tea (even decaffeinated tea), caffeinated sodas, chocolate, cocoa, and certain pain medicines.  Follow your health care provider's instructions regarding eating and drinking before the test.  Take your medicines as directed at regular times with water unless instructed otherwise. Exceptions may include:  If you have diabetes, ask how you are to take your insulin or pills. It is common to adjust insulin dosing the morning of the test.  If you are taking beta-blocker medicines, it is important to talk to your health care provider about these medicines well before the date of your test. Taking beta-blocker medicines may interfere with the test. In some cases, these medicines need to be changed or stopped 24 hours or more before the test.  If you wear a nitroglycerin patch, it may need to be removed prior to the test. Ask your health care provider if the patch should be removed before the test.  If you use an inhaler for any breathing condition, bring it with you to the test.  If you are an outpatient, bring a snack so you can eat right after the stress phase of the test.  Do not smoke for 4 hours prior to the test or as directed by your health care provider.  Do not apply lotions, powders, creams, or oils on  your chest prior to the test.  Wear comfortable shoes and clothing. Let your health care provider know if you were unable to complete or follow the preparations for your test. PROCEDURE   Multiple patches (electrodes) will be put on your chest. If needed, small areas of your chest may be shaved to get better contact with the electrodes. Once the electrodes are attached to your body, multiple wires will be attached to the electrodes, and your heart rate will be monitored.  An IV access will be started. A nuclear trace (isotope) is given. The isotope may be given intravenously, or it may be swallowed. Nuclear refers to several types of radioactive isotopes, and the nuclear isotope lights up the arteries so that the nuclear images are clear. The isotope is absorbed by your body. This results in low radiation exposure.  A resting nuclear image is taken to show how your heart functions at rest.  A medicine is given through the IV access.  A second scan is done about 1 hour after the medicine injection and determines how your heart functions under stress.  During this stress phase, you will be connected to an electrocardiogram machine. Your  blood pressure and oxygen levels will be monitored. AFTER THE PROCEDURE   Your heart rate and blood pressure will be monitored after the test.  You may return to your normal schedule, including diet,activities, and medicines, unless your health care provider tells you otherwise. This information is not intended to replace advice given to you by your health care provider. Make sure you discuss any questions you have with your health care provider. Document Released: 04/20/2009 Document Revised: 12/07/2013 Document Reviewed: 08/09/2013 Elsevier Interactive Patient Education  2017 [employer].

## 2017-01-30 NOTE — Progress Notes (Signed)
Cardiology Office Note   Date:  01/30/2017   ID:  Daniel [name], DOB 01/28/1961, MRN [id number]  Referring Doctor:  Elsie Stain, MD   Cardiologist:   Wende Bushy, MD   Reason for consultation:  Chief Complaint  Patient presents with  . other    Ref by Dr. Damita Dunnings for follow up from chest pain at [place] reviewed by the pt. verbally. "doing well."        History of Present Illness: Daniel [name] is a 56 y.o. male who presents for Chest pain.  Patient noted onset 2 weeks ago, sudden onset chest pain, tightness in the chest systems description, moderate intensity, lasting 30 minutes at the least, nonradiating, shortness of breath associated with it. No loss of consciousness. Blood pressure was noted to be elevated but since that time, despite no changes to his medications, blood pressure has improved.  Patient reports approximately 30 pound weight gain over the last 2 years.  Patient denies PND, orthopnea, edema. No palpitations or loss of consciousness. Patient reports that he snores.   ROS:  Please see the history of present illness. Aside from mentioned under HPI, all other systems are reviewed and negative.     Past Medical History:  Diagnosis Date  . 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 Chest pain    12/12:  Lexiscan Myoview demonstrated no ischemia or scar.  His EF was 41%.; Echocardiogram 01/16/12: LVH, EF [id number], diastolic function normal, no wall motion abnormalities, moderate LAE, mild RVE   . Esophageal stricture 11/08/2013  . GERD (gastroesophageal reflux disease) 02-18-12   none in 5 months  . Heart murmur 02-18-12   as child, not now  . Hyperlipidemia   . Hypertension   . Mass of colon   . Tubular adenoma of colon 11/2011  . Villous adenoma of right colon 03/12/2012   Laparoscopic right colectomy 02/25/2012     Past Surgical History:  Procedure  Laterality Date  . APPENDECTOMY  2013  . COLON RESECTION  02/25/2012   Procedure: LAPAROSCOPIC RIGHT COLON RESECTION;  Surgeon: Adin Hector, MD;  Location: [facility];  Service: General;  Laterality: Right;  laparoscopic assisted right colectomy  . COLONOSCOPY  11/21/11  . LAPAROSCOPIC RIGHT COLON RESECTION  2013   adenomatous polyp     reports that he has never smoked. He has never used smokeless tobacco. He reports that he does not drink alcohol or use drugs.   family history includes Alcohol abuse in his father; Coronary artery disease in his mother; Heart disease in his brother; Heart disease (age of onset: 81) in his mother; Hypertension in his mother.   Outpatient Medications Prior to Visit  Medication Sig Dispense Refill  . amLODipine (NORVASC) 10 MG tablet TAKE 1 TABLET BY MOUTH EVERY DAY FOR BLOOD PRESSURE 90 tablet 0  . aspirin 81 MG tablet Take 81 mg by mouth daily.    Marland Kitchen losartan (COZAAR) 50 MG tablet Take 1.5 tablets (75 mg total) by mouth every morning. 135 tablet 3  . pantoprazole (PROTONIX) 40 MG tablet 1 tab 1-2 times a day (Patient taking differently: Take 40 mg by mouth daily. 1 tab 1-2 times a day) 180 tablet 3  . pravastatin (PRAVACHOL) 40 MG tablet TAKE 1 TABLET BY MOUTH DAILY. 90 tablet 0   No facility-administered medications prior to visit.      Allergies: Oxycodone and Lisinopril  PHYSICAL EXAM: VS:  BP (!) 138/92 (BP Location: Right Arm, Patient Position: Sitting, Cuff Size: Large)   Pulse 74   Ht 6' (1.829 m)   Wt 260 lb 4 oz (118 kg)   BMI 35.30 kg/m  , Body mass index is 35.3 kg/m. Wt Readings from Last 3 Encounters:  01/30/17 260 lb 4 oz (118 kg)  01/23/17 262 lb 8 oz (119.1 kg)  01/17/17 257 lb 1.6 oz (116.6 kg)    GENERAL:  well developed, well nourished, obese, not in acute distress HEENT: normocephalic, pink conjunctivae, anicteric sclerae, no xanthelasma, normal dentition, oropharynx clear NECK:  no neck vein engorgement, JVP normal, no  hepatojugular reflux, carotid upstroke brisk and symmetric, no bruit, no thyromegaly, no lymphadenopathy LUNGS:  good respiratory effort, clear to auscultation bilaterally CV:  PMI not displaced, no thrills, no lifts, S1 and S2 within normal limits, no palpable S3 or S4, no murmurs, no rubs, no gallops ABD:  Soft, nontender, nondistended, normoactive bowel sounds, no abdominal aortic bruit, no hepatomegaly, no splenomegaly MS: nontender back, no kyphosis, no scoliosis, no joint deformities EXT:  2+ DP/PT pulses, no edema, no varicosities, no cyanosis, no clubbing SKIN: warm, nondiaphoretic, normal turgor, no ulcers NEUROPSYCH: alert, oriented to person, place, and time, sensory/motor grossly intact, normal mood, appropriate affect  Recent Labs: 01/16/2017: BUN 12; Creatinine, Ser 1.09; Hemoglobin 13.4; Platelets 225; Potassium 4.5; Sodium 139   Lipid Panel    Component Value Date/Time   CHOL 153 01/16/2017 1529   TRIG 75 01/16/2017 1529   HDL 38 (L) 01/16/2017 1529   CHOLHDL 4.0 01/16/2017 1529   VLDL 15 01/16/2017 1529   LDLCALC 100 (H) 01/16/2017 1529   LDLDIRECT 168.6 07/12/2013 0938     Other studies Reviewed:  EKG:  The ekg from 01/30/2017 was personally reviewed by me and it revealed sinus rhythm, 74 BPM.  Additional studies/ records that were reviewed personally reviewed by me today include: Echo 01/16/2017: Left ventricle: The cavity size was normal. Wall thickness was   increased in a pattern of mild LVH. Systolic function was normal.   The estimated ejection fraction was in the range of 55% to 60%.   Wall motion was normal; there were no regional wall motion   abnormalities. Doppler parameters are consistent with abnormal   left ventricular relaxation (grade 1 diastolic dysfunction). - Left atrium: The atrium was mildly dilated.  Impressions:  - Normal LV systolic function; mild LVH; grade 1 diastolic   dysfunction; mild LAE.     ASSESSMENT AND PLAN: Chest  pain Risk factors for CAD include hypertension, obesity, hyperlipidemia. Recommend further evaluation with pharmacologic nuclear stress test, concerned with not able to walk on the treadmill.   Hypertension Has since improved but recommend blood pressure log and follow with PCP. With history snoring, and obesity, recommend follow up with PCP regarding possibility of ordering a sleep study. Sodium restriction recommended. Lifestyle changes recommended.  Hyperlipidemia  Agree with statin therapy. DC the following labs.  Obesity Body mass index is 35.3 kg/m.Marland Kitchen Recommend aggressive weight loss through diet and increased physical activity.     Current medicines are reviewed at length with the patient today.  The patient does not have concerns regarding medicines.  Labs/ tests ordered today include: No orders of the defined types were placed in this encounter.   I had a lengthy and detailed discussion with the patient regarding diagnoses, prognosis, diagnostic options.   I counseled the patient on importance of lifestyle modification including  heart healthy diet, regular physical activity once cardiac workup is completed..   Disposition:   FU with undersigned after tests .   Thank you for this consultation. We will forwarding this consultation to referring physician.   Signed, Wende Bushy, MD  01/30/2017 11:26 AM    [place]  This note was generated in part with voice recognition software and I apologize for any typographical errors that were not detected and corrected.

## 2017-02-14 ENCOUNTER — Other Ambulatory Visit: Payer: Self-pay | Admitting: Family Medicine

## 2017-02-14 DIAGNOSIS — R1310 Dysphagia, unspecified: Secondary | ICD-10-CM

## 2017-02-14 DIAGNOSIS — R1319 Other dysphagia: Secondary | ICD-10-CM

## 2017-02-14 DIAGNOSIS — K219 Gastro-esophageal reflux disease without esophagitis: Secondary | ICD-10-CM

## 2017-03-28 ENCOUNTER — Other Ambulatory Visit: Payer: Self-pay | Admitting: Family Medicine

## 2017-06-02 ENCOUNTER — Emergency Department (HOSPITAL_COMMUNITY): Payer: 59

## 2017-06-02 ENCOUNTER — Emergency Department (HOSPITAL_COMMUNITY)
Admission: EM | Admit: 2017-06-02 | Discharge: 2017-06-02 | Disposition: A | Discharge status: Home / Self Care | Payer: 59 | Attending: Emergency Medicine | Admitting: Emergency Medicine

## 2017-06-02 ENCOUNTER — Encounter (HOSPITAL_COMMUNITY): Payer: Self-pay | Admitting: Emergency Medicine

## 2017-06-02 DIAGNOSIS — S02611A Fracture of condylar process of right mandible, initial encounter for closed fracture: Secondary | ICD-10-CM | POA: Diagnosis not present

## 2017-06-02 DIAGNOSIS — Y9389 Activity, other specified: Secondary | ICD-10-CM | POA: Insufficient documentation

## 2017-06-02 DIAGNOSIS — Y9289 Other specified places as the place of occurrence of the external cause: Secondary | ICD-10-CM | POA: Insufficient documentation

## 2017-06-02 DIAGNOSIS — S0990XA Unspecified injury of head, initial encounter: Secondary | ICD-10-CM

## 2017-06-02 DIAGNOSIS — S0181XA Laceration without foreign body of other part of head, initial encounter: Secondary | ICD-10-CM

## 2017-06-02 DIAGNOSIS — S0292XA Unspecified fracture of facial bones, initial encounter for closed fracture: Secondary | ICD-10-CM | POA: Diagnosis not present

## 2017-06-02 DIAGNOSIS — I10 Essential (primary) hypertension: Secondary | ICD-10-CM | POA: Diagnosis not present

## 2017-06-02 DIAGNOSIS — Y999 Unspecified external cause status: Secondary | ICD-10-CM | POA: Insufficient documentation

## 2017-06-02 DIAGNOSIS — W228XXA Striking against or struck by other objects, initial encounter: Secondary | ICD-10-CM | POA: Diagnosis not present

## 2017-06-02 DIAGNOSIS — Z7982 Long term (current) use of aspirin: Secondary | ICD-10-CM | POA: Diagnosis not present

## 2017-06-02 DIAGNOSIS — Z79899 Other long term (current) drug therapy: Secondary | ICD-10-CM | POA: Diagnosis not present

## 2017-06-02 DIAGNOSIS — S0281XA Fracture of other specified skull and facial bones, right side, initial encounter for closed fracture: Secondary | ICD-10-CM | POA: Diagnosis not present

## 2017-06-02 DIAGNOSIS — S0993XA Unspecified injury of face, initial encounter: Secondary | ICD-10-CM | POA: Diagnosis not present

## 2017-06-02 DIAGNOSIS — S02610A Fracture of condylar process of mandible, unspecified side, initial encounter for closed fracture: Secondary | ICD-10-CM | POA: Diagnosis not present

## 2017-06-02 DIAGNOSIS — S01511A Laceration without foreign body of lip, initial encounter: Secondary | ICD-10-CM | POA: Diagnosis not present

## 2017-06-02 MED ORDER — LIDOCAINE HCL (PF) 1 % IJ SOLN
5.0000 mL | Freq: Once | INTRAMUSCULAR | Status: DC
Start: 2017-06-02 — End: 2017-06-02
  Filled 2017-06-02: qty 5

## 2017-06-02 MED ORDER — CEFAZOLIN SODIUM-DEXTROSE 1-4 GM/50ML-% IV SOLN
1.0000 g | Freq: Once | INTRAVENOUS | Status: AC
  Administered 2017-06-02: 1 g via INTRAVENOUS
  Filled 2017-06-02: qty 50

## 2017-06-02 MED ORDER — MORPHINE SULFATE (PF) 4 MG/ML IV SOLN
4.0000 mg | Freq: Once | INTRAVENOUS | Status: AC
Start: 2017-06-02 — End: 2017-06-02
  Administered 2017-06-02: 4 mg via INTRAMUSCULAR
  Filled 2017-06-02: qty 1

## 2017-06-02 MED ORDER — TRAMADOL HCL 50 MG PO TABS: 50.0000 mg | ORAL_TABLET | Freq: Four times a day (QID) | ORAL | 0 refills | Status: DC | PRN

## 2017-06-02 MED ORDER — IBUPROFEN 400 MG PO TABS: 400.0000 mg | ORAL_TABLET | Freq: Three times a day (TID) | ORAL | 0 refills | Status: AC

## 2017-06-02 MED ORDER — MORPHINE SULFATE (PF) 4 MG/ML IV SOLN
4.0000 mg | Freq: Once | INTRAVENOUS | Status: AC
  Administered 2017-06-02: 4 mg via INTRAMUSCULAR
  Filled 2017-06-02: qty 1

## 2017-06-02 NOTE — Discharge Instructions (Signed)
As discussed, it is important that you monitor your condition carefully, and do not hesitate to return here for concerning changes in your condition.  Otherwise, please be sure to follow-up with our ENT physician in 5 days.

## 2017-06-02 NOTE — ED Notes (Signed)
Family at bedside. 

## 2017-06-02 NOTE — ED Notes (Signed)
Patient able to ambulate independently  

## 2017-06-02 NOTE — ED Notes (Signed)
Pt transported to CT ?

## 2017-06-02 NOTE — ED Notes (Signed)
MD at bedside performing laceration repair.

## 2017-06-02 NOTE — ED Notes (Signed)
Patient transported to CT 

## 2017-06-02 NOTE — ED Triage Notes (Signed)
Pt was at his home and pulled down his garage door and the door broke and fell onto his head, pt states positive LOC. Pts wife drove him to fire station and fire station called [place] em[facility] to transport him to ED. Pt arrives alert and ox4. Pt has swelling to right side of jaw with bleeding in right ear, pt has laceration on chin, bleeding controlled at this time. Pt was given 155mcg fentaly PTA.

## 2017-06-02 NOTE — ED Notes (Signed)
MD at bedside updating patient/family 

## 2017-06-02 NOTE — ED Provider Notes (Signed)
[facility] DEPT Provider Note   CSN: [id number] Arrival date & time: 06/02/17  7253     History   Chief Complaint Chief Complaint  Patient presents with  . Head Injury    HPI Daniel [name] is a 56 y.o. male.  HPI  Patient presents after sustaining a head injury. Patient was in his usual state of health, when he was struck by a spring-loaded steel bar. Patient was working on his garage door, when a part broke. There was loss of consciousness per Since the event the patient has had pain in his chin, right lateral face. No neck pain, no weakness in any extremity, no vision changes. He has had ongoing bleeding in these areas. Patient states he is generally well, has no substantial medical problems, does not take blood thinning medication beyond aspirin. Since onset the pain has been severe, sharp, worse with motion, including opening his jaw.   Past Medical History:  Diagnosis Date  . 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 Chest pain    12/12:  Lexiscan Myoview demonstrated no ischemia or scar.  His EF was 41%.; Echocardiogram 01/16/12: LVH, EF 66-44%, diastolic function normal, no wall motion abnormalities, moderate LAE, mild RVE   . Esophageal stricture 11/08/2013  . GERD (gastroesophageal reflux disease) 02-18-12   none in 5 months  . Heart murmur 02-18-12   as child, not now  . Hyperlipidemia   . Hypertension   . Mass of colon   . Tubular adenoma of colon 11/2011  . Villous adenoma of right colon 03/12/2012   Laparoscopic right colectomy 02/25/2012     Patient Active Problem List   Diagnosis Date Noted  . Advance care planning 12/25/2015  . Sciatica 08/09/2014  . Esophageal stricture 11/08/2013  . Syncope 12/06/2011  . Atypical chest pain 11/28/2011  . Routine general medical examination at a health care facility 10/18/2011  . Personal history of adenomatous colonic polyps  10/18/2011  . GERD (gastroesophageal reflux disease) 06/23/2011  . Allergic rhinitis 05/25/2009  . Obesity 04/27/2009  . ERECTILE DYSFUNCTION 05/12/2008  . HYPERLIPIDEMIA 12/31/2007  . Hypertension 11/27/2007    Past Surgical History:  Procedure Laterality Date  . APPENDECTOMY  2013  . COLON RESECTION  02/25/2012   Procedure: LAPAROSCOPIC RIGHT COLON RESECTION;  Surgeon: Adin Hector, MD;  Location: [facility];  Service: General;  Laterality: Right;  laparoscopic assisted right colectomy  . COLONOSCOPY  11/21/11  . LAPAROSCOPIC RIGHT COLON RESECTION  2013   adenomatous polyp       Home Medications    Prior to Admission medications   Medication Sig Start Date End Date Taking? Authorizing Provider  amLODipine (NORVASC) 10 MG tablet TAKE 1 TABLET BY MOUTH EVERY DAY FOR BLOOD PRESSURE 03/28/17  Yes Tonia Ghent, MD  aspirin 81 MG tablet Take 81 mg by mouth daily.   Yes [provider]  losartan (COZAAR) 50 MG tablet Take 1.5 tablets (75 mg total) by mouth every morning. 12/22/15  Yes Tonia Ghent, MD  pantoprazole (PROTONIX) 40 MG tablet TAKE 1 TABLET BY MOUTH EVERY DAY TO TWICE A DAY 02/14/17  Yes Tonia Ghent, MD  pravastatin (PRAVACHOL) 40 MG tablet TAKE 1 TABLET BY MOUTH DAILY. 03/28/17  Yes Tonia Ghent, MD    Family History Family History  Problem Relation Age of Onset  . Heart disease Mother 67       MI,  died at age 48  . Hypertension Mother   . Coronary artery disease Mother   . Alcohol abuse Father        cirrhoisis of liver-- ETOH abuse  . Heart disease Brother   . Colon cancer Neg Hx   . Prostate cancer Neg Hx   . Esophageal cancer Neg Hx   . Rectal cancer Neg Hx   . Stomach cancer Neg Hx     Social History Social History  Substance Use Topics  . Smoking status: Never Smoker  . Smokeless tobacco: Never Used  . Alcohol use No     Allergies   Oxycodone and Lisinopril   Review of Systems Review of Systems  Constitutional:        Per HPI, otherwise negative  HENT:       Per HPI, otherwise negative  Respiratory:       Per HPI, otherwise negative  Cardiovascular:       Per HPI, otherwise negative  Gastrointestinal: Negative for vomiting.  Endocrine:       Negative aside from HPI  Genitourinary:       Neg aside from HPI   Musculoskeletal:       Per HPI, otherwise negative  Skin: Positive for wound.  Allergic/Immunologic: Negative for immunocompromised state.  Neurological: Positive for syncope.     Physical Exam Updated Vital Signs BP (!) 160/102   Pulse 77   Temp 97.7 F (36.5 C) (Oral)   Resp (!) 21   SpO2 98%   Physical Exam  Constitutional: He is oriented to person, place, and time. He appears well-developed. No distress.  HENT:  Head: Normocephalic.  Right Ear: There is hemotympanum.  Mouth/Throat:    Mild trismus, and tenderness, swelling throughout the right lateral face.  Eyes: Conjunctivae and EOM are normal.  Neck: No spinous process tenderness and no muscular tenderness present. No neck rigidity. No edema, no erythema and normal range of motion present.  Cardiovascular: Normal rate and regular rhythm.   Pulmonary/Chest: Effort normal. No stridor. No respiratory distress.  Abdominal: He exhibits no distension.  Musculoskeletal: He exhibits no edema.  Neurological: He is alert and oriented to person, place, and time. He displays no atrophy and no tremor. No cranial nerve deficit or sensory deficit. He exhibits normal muscle tone. He displays no seizure activity.  Skin: Skin is warm and dry.  Psychiatric: He has a normal mood and affect.  Nursing note and vitals reviewed.    ED Treatments / Results   Radiology Ct Head Wo Contrast  Result Date: 06/02/2017 CLINICAL DATA:  Head/face injury, tension bar broke off garage door and hit side of patient's head/face, laceration to bottom lip, bleeding from right ear EXAM: CT HEAD WITHOUT CONTRAST CT MAXILLOFACIAL WITHOUT CONTRAST TECHNIQUE:  Multidetector CT imaging of the head and maxillofacial structures were performed using the standard protocol without intravenous contrast. Multiplanar CT image reconstructions of the maxillofacial structures were also generated. COMPARISON:  None. FINDINGS: CT HEAD FINDINGS Brain: No evidence of acute infarction, hemorrhage, hydrocephalus, extra-axial collection or mass lesion/mass effect. Vascular: No hyperdense vessel or unexpected calcification. Skull: No evidence of calvarial fracture. Additional findings described on dedicated maxillofacial CT. Other: None. CT MAXILLOFACIAL FINDINGS Osseous: Nondisplaced fracture involving the right mandibular condyle (series 4/image 11). Associated nondisplaced fracture involving the anterior wall of the right external auditory canal (series 8/ image 53). Right mastoid is intact. Ground-glass lesions along the right sphenoid wing (series 8/ image 61) and left mandible (series 8/  image 39), suspicious for fibrous dysplasia. Orbits: Bilateral orbits, including the globes and retroconal soft tissues, are within normal limits. Sinuses: Visualized paranasal sinuses and mastoid air cells are clear. Soft tissues: Soft tissue gas adjacent to the right external auditory canal (series 8/ image 53). Soft tissue laceration along the left anterior bottom lip (series 8/ image 23). IMPRESSION: Nondisplaced fractures involving the right mandibular condyles in the anterior wall of the right external auditory canal. No evidence of acute intracranial abnormality. 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 Electronically Signed   By: Julian Hy M.D.   On: 06/02/2017 12:09   Ct Maxillofacial Wo Contrast  Result Date: 06/02/2017 CLINICAL DATA:  Head/face injury, tension bar broke off garage door and hit side of patient's head/face, laceration to bottom lip, bleeding from right ear EXAM: CT HEAD WITHOUT CONTRAST CT MAXILLOFACIAL WITHOUT CONTRAST  TECHNIQUE: Multidetector CT imaging of the head and maxillofacial structures were performed using the standard protocol without intravenous contrast. Multiplanar CT image reconstructions of the maxillofacial structures were also generated. COMPARISON:  None. FINDINGS: CT HEAD FINDINGS Brain: No evidence of acute infarction, hemorrhage, hydrocephalus, extra-axial collection or mass lesion/mass effect. Vascular: No hyperdense vessel or unexpected calcification. Skull: No evidence of calvarial fracture. Additional findings described on dedicated maxillofacial CT. Other: None. CT MAXILLOFACIAL FINDINGS Osseous: Nondisplaced fracture involving the right mandibular condyle (series 4/image 11). Associated nondisplaced fracture involving the anterior wall of the right external auditory canal (series 8/ image 53). Right mastoid is intact. Ground-glass lesions along the right sphenoid wing (series 8/ image 61) and left mandible (series 8/ image 39), suspicious for fibrous dysplasia. Orbits: Bilateral orbits, including the globes and retroconal soft tissues, are within normal limits. Sinuses: Visualized paranasal sinuses and mastoid air cells are clear. Soft tissues: Soft tissue gas adjacent to the right external auditory canal (series 8/ image 53). Soft tissue laceration along the left anterior bottom lip (series 8/ image 23). IMPRESSION: Nondisplaced fractures involving the right mandibular condyles in the anterior wall of the right external auditory canal. No evidence of acute intracranial abnormality. 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 Electronically Signed   By: Julian Hy M.D.   On: 06/02/2017 12:09    Procedures Procedures (including critical care time)  Medications Ordered in ED Medications  lidocaine (PF) (XYLOCAINE) 1 % injection 5 mL (not administered)  morphine 4 MG/ML injection 4 mg (4 mg Intramuscular Given 06/02/17 1006)  ceFAZolin (ANCEF) IVPB 1 g/50  mL premix (0 g Intravenous Stopped 06/02/17 1400)  morphine 4 MG/ML injection 4 mg (4 mg Intramuscular Given 06/02/17 1301)     Initial Impression / Assessment and Plan / ED Course  I have reviewed the triage vital signs and the nursing notes.  Pertinent labs & imaging results that were available during my care of the patient were reviewed by me and considered in my medical decision making (see chart for details).  I discussed patient's case with our otolaryngologist. On repeat exam the patient is awake and alert, in no distress, discussed all findings, and 2 companions.  LACERATION REPAIR Performed by: Carmin Muskrat Authorized by: Carmin Muskrat Consent: Verbal consent obtained. Risks and benefits: risks, benefits and alternatives were discussed Consent given by: patient Patient identity confirmed: provided demographic data Prepped and Draped in normal sterile fashion Wound explored  Laceration Location: face - inferior to L lip vermillion border  Laceration Length: 4.5cm  No Foreign Bodies seen or palpated  Anesthesia:  local infiltration  Local anesthetic: lidocaine 1% w epinephrine  Anesthetic total: 3 ml  Irrigation method: syringe Amount of cleaning: standard  Skin closure: close approximation  Number of sutures: 5 6-0 non-absorbable  Technique: close approx  Patient tolerance: Patient tolerated the procedure well with no immediate complications.  LACERATION REPAIR Performed by: Carmin Muskrat Authorized by: Carmin Muskrat Consent: Verbal consent obtained. Risks and benefits: risks, benefits and alternatives were discussed Consent given by: patient Patient identity confirmed: provided demographic data Prepped and Draped in normal sterile fashion Wound explored  Laceration Location: interior lip  Laceration Length: 3.5cm  No Foreign Bodies seen or palpated  Anesthesia: local infiltration  Local anesthetic: lidocaine 1% w  epinephrine  Anesthetic total: 2 ml  Irrigation method: syringe Amount of cleaning: standard  Skin closure: 5-0 absorbable  Number of sutures: 3  Technique: close  Patient tolerance: Patient tolerated the procedure well with no immediate complications.  Patient tolerated suture repair well of both the anterior and external lacerations. No new complaints, no new neurologic deficiencies. I discussed the patient's case with our otolaryngologist, and he has reviewed the patient's CT scans as well. No indication for emergent surgical repair, but the patient will follow up with ENT in the office.  With no neurologic deficiencies, no evidence for distress, stable vital signs, and occasions of his suture repair, I discussed the need for close outpatient follow-up, soft diet, return precautions with the patient and to colleagues. Patient discharged in stable condition.  Final Clinical Impressions(s) / ED Diagnoses  Head trauma Mandible fracture, initial encounter, closed Facial bone fracture, initial encounter, closed Facial laceration, complex, initial encounter   Carmin Muskrat, MD 06/02/17 1449

## 2017-06-05 DIAGNOSIS — S02610A Fracture of condylar process of mandible, unspecified side, initial encounter for closed fracture: Secondary | ICD-10-CM | POA: Insufficient documentation

## 2017-06-05 DIAGNOSIS — S02611A Fracture of condylar process of right mandible, initial encounter for closed fracture: Secondary | ICD-10-CM | POA: Diagnosis not present

## 2017-06-05 DIAGNOSIS — S02612A Fracture of condylar process of left mandible, initial encounter for closed fracture: Secondary | ICD-10-CM | POA: Diagnosis not present

## 2017-06-05 DIAGNOSIS — S01511D Laceration without foreign body of lip, subsequent encounter: Secondary | ICD-10-CM | POA: Diagnosis not present

## 2017-06-09 DIAGNOSIS — S02610A Fracture of condylar process of mandible, unspecified side, initial encounter for closed fracture: Secondary | ICD-10-CM | POA: Diagnosis not present

## 2017-06-19 DIAGNOSIS — S02610A Fracture of condylar process of mandible, unspecified side, initial encounter for closed fracture: Secondary | ICD-10-CM | POA: Diagnosis not present

## 2017-09-10 ENCOUNTER — Ambulatory Visit: Payer: Commercial Managed Care - HMO | Admitting: Family Medicine

## 2017-09-15 ENCOUNTER — Ambulatory Visit (INDEPENDENT_AMBULATORY_CARE_PROVIDER_SITE_OTHER)
Admission: RE | Admit: 2017-09-15 | Discharge: 2017-09-15 | Disposition: A | Discharge status: Home / Self Care | Payer: Commercial Managed Care - HMO | Source: Ambulatory Visit | Attending: Family Medicine | Admitting: Family Medicine

## 2017-09-15 ENCOUNTER — Encounter: Payer: Self-pay | Admitting: Family Medicine

## 2017-09-15 ENCOUNTER — Ambulatory Visit (INDEPENDENT_AMBULATORY_CARE_PROVIDER_SITE_OTHER): Payer: Commercial Managed Care - HMO | Admitting: Family Medicine

## 2017-09-15 VITALS — BP 144/96 | HR 72 | Temp 98.6°F | Wt 267.2 lb

## 2017-09-15 DIAGNOSIS — M25562 Pain in left knee: Secondary | ICD-10-CM

## 2017-09-15 DIAGNOSIS — M25561 Pain in right knee: Secondary | ICD-10-CM | POA: Insufficient documentation

## 2017-09-15 DIAGNOSIS — M179 Osteoarthritis of knee, unspecified: Secondary | ICD-10-CM | POA: Diagnosis not present

## 2017-09-15 MED ORDER — TRAMADOL HCL 50 MG PO TABS: 50.0000 mg | ORAL_TABLET | Freq: Four times a day (QID) | ORAL | 0 refills | Status: DC | PRN

## 2017-09-15 MED ORDER — PREDNISONE 10 MG PO TABS: ORAL_TABLET | ORAL | 0 refills | Status: DC

## 2017-09-15 NOTE — Patient Instructions (Signed)
Go to the lab on the way out.  We'll contact you with your xray report. Prednisone with food, don't take with ibuprofen or aleve.   Tramadol as needed- sedation caution.  Take care.  Glad to see you.  If not better, then we may need to get you to see Dr. Lorelei Pont.

## 2017-09-15 NOTE — Assessment & Plan Note (Signed)
He could have a meniscal injury. He could have an osteoarthritis flare. Discussed with patient. He understood. Prednisone with food, don't take with ibuprofen or aleve.   Tramadol as needed- sedation caution.  If not better, then we may need to get him to see Dr. Lorelei Pont.  He'll update me as needed. See notes on imaging.
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
## 2018-01-10 ENCOUNTER — Encounter (HOSPITAL_COMMUNITY): Payer: Self-pay | Admitting: Oncology

## 2018-01-10 ENCOUNTER — Emergency Department (HOSPITAL_COMMUNITY)
Admission: EM | Admit: 2018-01-10 | Discharge: 2018-01-10 | Disposition: A | Discharge status: Home / Self Care | Payer: 59 | Attending: Emergency Medicine | Admitting: Emergency Medicine

## 2018-01-10 ENCOUNTER — Emergency Department (HOSPITAL_COMMUNITY): Payer: 59

## 2018-01-10 ENCOUNTER — Other Ambulatory Visit: Payer: Self-pay

## 2018-01-10 DIAGNOSIS — Z7982 Long term (current) use of aspirin: Secondary | ICD-10-CM | POA: Insufficient documentation

## 2018-01-10 DIAGNOSIS — R079 Chest pain, unspecified: Secondary | ICD-10-CM | POA: Diagnosis present

## 2018-01-10 DIAGNOSIS — I10 Essential (primary) hypertension: Secondary | ICD-10-CM | POA: Insufficient documentation

## 2018-01-10 DIAGNOSIS — R0789 Other chest pain: Secondary | ICD-10-CM | POA: Diagnosis not present

## 2018-01-10 DIAGNOSIS — R0602 Shortness of breath: Secondary | ICD-10-CM | POA: Diagnosis not present

## 2018-01-10 DIAGNOSIS — Z79899 Other long term (current) drug therapy: Secondary | ICD-10-CM | POA: Diagnosis not present

## 2018-01-10 LAB — CBC
HCT: 41 % (ref 39.0–52.0)
Hemoglobin: 13.2 g/dL (ref 13.0–17.0)
MCH: 25.8 pg — AB (ref 26.0–34.0)
MCHC: 32.2 g/dL (ref 30.0–36.0)
MCV: 80.2 fL (ref 78.0–100.0)
PLATELETS: 262 10*3/uL (ref 150–400)
RBC: 5.11 MIL/uL (ref 4.22–5.81)
RDW: 14.1 % (ref 11.5–15.5)
WBC: 6.4 10*3/uL (ref 4.0–10.5)

## 2018-01-10 LAB — BASIC METABOLIC PANEL
Anion gap: 8 (ref 5–15)
BUN: 14 mg/dL (ref 6–20)
CHLORIDE: 106 mmol/L (ref 101–111)
CO2: 23 mmol/L (ref 22–32)
CREATININE: 0.96 mg/dL (ref 0.61–1.24)
Calcium: 8.5 mg/dL — ABNORMAL LOW (ref 8.9–10.3)
GFR calc non Af Amer: >60 mL/min (ref >60)
Glucose, Bld: 124 mg/dL — ABNORMAL HIGH (ref 65–99)
Potassium: 3.8 mmol/L (ref 3.5–5.1)
Sodium: 137 mmol/L (ref 135–145)

## 2018-01-10 LAB — I-STAT TROPONIN, ED: Troponin i, poc: 0 ng/mL (ref 0.00–0.08)

## 2018-01-10 NOTE — ED Provider Notes (Signed)
[facility] EMERGENCY DEPARTMENT Provider Note   CSN: [id number] Arrival date & time: 01/10/18  0252     History   Chief Complaint Chief Complaint  Patient presents with  . Chest Pain    HPI Daniel [name] is a 57 y.o. male.  Patient is a 57 year old male with past medical history of hypertension, hypercholesterolemia presenting for evaluation of chest discomfort.  He went to bed this evening feeling fine, then woke at approximately 1 AM with tightness in his chest and feeling short of breath.  The sensation lasted for approximately 45 minutes and resolved after he received nitroglycerin by EMS.  He denies any diaphoresis, nausea, or radiation of his pain.  He reports similar episodes in the past for which he is undergone workup that has always been unremarkable.   The history is provided by the patient.  Chest Pain   This is a new problem. The current episode started less than 1 hour ago. The problem occurs constantly. The problem has been resolved. Associated with: Sleeping. The pain is moderate. The quality of the pain is described as pressure-like. The pain does not radiate.    Past Medical History:  Diagnosis Date  . 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 Chest pain    12/12:  Lexiscan Myoview demonstrated no ischemia or scar.  His EF was 41%.; Echocardiogram 01/16/12: LVH, EF 56-38%, diastolic function normal, no wall motion abnormalities, moderate LAE, mild RVE   . Esophageal stricture 11/08/2013  . GERD (gastroesophageal reflux disease) 02-18-12   none in 5 months  . Heart murmur 02-18-12   as child, not now  . Hyperlipidemia   . Hypertension   . Mass of colon   . Tubular adenoma of colon 11/2011  . Villous adenoma of right colon 03/12/2012   Laparoscopic right colectomy 02/25/2012     Patient Active Problem List   Diagnosis Date Noted  . Left medial knee pain  09/15/2017  . Advance care planning 12/25/2015  . Sciatica 08/09/2014  . Esophageal stricture 11/08/2013  . Syncope 12/06/2011  . Atypical chest pain 11/28/2011  . Routine general medical examination at a health care facility 10/18/2011  . Personal history of adenomatous colonic polyps 10/18/2011  . GERD (gastroesophageal reflux disease) 06/23/2011  . Allergic rhinitis 05/25/2009  . Obesity 04/27/2009  . ERECTILE DYSFUNCTION 05/12/2008  . HYPERLIPIDEMIA 12/31/2007  . Hypertension 11/27/2007    Past Surgical History:  Procedure Laterality Date  . APPENDECTOMY  2013  . COLON RESECTION  02/25/2012   Procedure: LAPAROSCOPIC RIGHT COLON RESECTION;  Surgeon: Adin Hector, MD;  Location: [facility];  Service: General;  Laterality: Right;  laparoscopic assisted right colectomy  . COLONOSCOPY  11/21/11  . LAPAROSCOPIC RIGHT COLON RESECTION  2013   adenomatous polyp       Home Medications    Prior to Admission medications   Medication Sig Start Date End Date Taking? Authorizing Provider  amLODipine (NORVASC) 10 MG tablet TAKE 1 TABLET BY MOUTH EVERY DAY FOR BLOOD PRESSURE 03/28/17   Tonia Ghent, MD  aspirin 81 MG tablet Take 81 mg by mouth daily.    [provider]  losartan (COZAAR) 50 MG tablet Take 1.5 tablets (75 mg total) by mouth every morning. 12/22/15   Tonia Ghent, MD  pantoprazole (PROTONIX) 40 MG tablet TAKE 1 TABLET BY MOUTH EVERY DAY TO TWICE A DAY 02/14/17  Tonia Ghent, MD  pravastatin (PRAVACHOL) 40 MG tablet TAKE 1 TABLET BY MOUTH DAILY. 03/28/17   Tonia Ghent, MD  predniSONE (DELTASONE) 10 MG tablet 2 a day for 5 days, then 1 a day for 5 days, with food. 09/15/17   Tonia Ghent, MD  traMADol (ULTRAM) 50 MG tablet Take 1 tablet (50 mg total) by mouth every 6 (six) hours as needed (sedation caution). 09/15/17   Tonia Ghent, MD    Family History Family History  Problem Relation Age of Onset  . Heart disease Mother 9       MI, died at age  42  . Hypertension Mother   . Coronary artery disease Mother   . Alcohol abuse Father        cirrhoisis of liver-- ETOH abuse  . Heart disease Brother   . Colon cancer Neg Hx   . Prostate cancer Neg Hx   . Esophageal cancer Neg Hx   . Rectal cancer Neg Hx   . Stomach cancer Neg Hx     Social History Social History   Tobacco Use  . Smoking status: Never Smoker  . Smokeless tobacco: Never Used  Substance Use Topics  . Alcohol use: No  . Drug use: No     Allergies   Oxycodone and Lisinopril   Review of Systems Review of Systems  Cardiovascular: Positive for chest pain.  All other systems reviewed and are negative.    Physical Exam Updated Vital Signs BP 134/82 (BP Location: Right Arm)   Pulse 82   Temp 97.8 F (36.6 C) (Oral)   Resp 10   Ht 6' (1.829 m)   Wt 118.4 kg (261 lb)   SpO2 98%   BMI 35.40 kg/m   Physical Exam  Constitutional: He is oriented to person, place, and time. He appears well-developed and well-nourished. No distress.  HENT:  Head: Normocephalic and atraumatic.  Mouth/Throat: Oropharynx is clear and moist.  Neck: Normal range of motion. Neck supple.  Cardiovascular: Normal rate and regular rhythm. Exam reveals no friction rub.  No murmur heard. Pulmonary/Chest: Effort normal and breath sounds normal. No respiratory distress. He has no wheezes. He has no rales.  Abdominal: Soft. Bowel sounds are normal. He exhibits no distension. There is no tenderness.  Musculoskeletal: Normal range of motion. He exhibits no edema.  Neurological: He is alert and oriented to person, place, and time. Coordination normal.  Skin: Skin is warm and dry. He is not diaphoretic.  Nursing note and vitals reviewed.    ED Treatments / Results  Labs (all labs ordered are listed, but only abnormal results are displayed) Labs Reviewed  CBC - Abnormal; Notable for the following components:      Result Value   MCH 25.8 (*)    All other components within normal  limits  BASIC METABOLIC PANEL  I-STAT TROPONIN, ED    EKG  EKG Interpretation  Date/Time:  Saturday January 10 2018 03:01:40 EST Ventricular Rate:  85 PR Interval:    QRS Duration: 95 QT Interval:  331 QTC Calculation: 394 R Axis:   60 Text Interpretation:  Sinus rhythm Borderline repolarization abnormality Confirmed by Veryl Speak [phone]) on 01/10/2018 3:10:13 AM       Radiology No results found.  Procedures Procedures (including critical care time)  Medications Ordered in ED Medications - No data to display   Initial Impression / Assessment and Plan / ED Course  I have reviewed the triage vital signs  and the nursing notes.  Pertinent labs & imaging results that were available during my care of the patient were reviewed by me and considered in my medical decision making (see chart for details).  Patient presents here with complaints of chest discomfort that woke him from sleep.  He has had multiple similar episodes in the past and has had a normal stress echo in the past.  His EKG is unchanged and troponin is negative.  He is requesting to go home.  At this point, I feel as though this is a reasonable course of action.  He will be discharged, to follow-up with his primary doctor as needed.  Final Clinical Impressions(s) / ED Diagnoses   Final diagnoses:  None    ED Discharge Orders    None       Veryl Speak, MD 01/10/18 [phone]

## 2018-01-10 NOTE — ED Triage Notes (Signed)
Pt bib [facility] from home d/t left sided CP.  Per EMS pt woke up at approximately 0100 w/ shob and left sided chest pain w/o radiation he described as a dull ache to EMS.  Pt given 324 mg ASA and 4, 0.4 mg nitro tablets en route.  Pt's pain initially 8/10, currently 1/10.  Pt reported to EMS having a cold x 2 weeks.

## 2018-01-10 NOTE — Discharge Instructions (Signed)
Continue your medications as before. ° °Return to the emergency department if your symptoms significantly worsen or change. °

## 2018-01-11 ENCOUNTER — Telehealth: Payer: Self-pay | Admitting: Family Medicine

## 2018-01-11 NOTE — Telephone Encounter (Signed)
ER visit, please check on patient.  Thanks.

## 2018-01-12 NOTE — Telephone Encounter (Signed)
Patient says he is doing better.  He was having some chest pain and SOB but the hospital ran a bunch of tests and didn't really find any problem.  Patient says he has a cold that has lingered for weeks now and when he gets better with that, he is going to try to get a flu shot.  An appointment was offered for his URI symptoms but patient declined.

## 2018-01-12 NOTE — Telephone Encounter (Signed)
Noted. Thanks.  I'll defer to patient.  

## 2018-02-03 ENCOUNTER — Ambulatory Visit: Payer: 59 | Admitting: Family Medicine

## 2018-02-03 ENCOUNTER — Encounter: Payer: Self-pay | Admitting: Family Medicine

## 2018-02-03 DIAGNOSIS — R05 Cough: Secondary | ICD-10-CM | POA: Diagnosis not present

## 2018-02-03 DIAGNOSIS — R059 Cough, unspecified: Secondary | ICD-10-CM | POA: Insufficient documentation

## 2018-02-03 MED ORDER — BENZONATATE 200 MG PO CAPS: 200.0000 mg | ORAL_CAPSULE | Freq: Three times a day (TID) | ORAL | 1 refills | Status: DC | PRN

## 2018-02-03 MED ORDER — ALBUTEROL SULFATE HFA 108 (90 BASE) MCG/ACT IN AERS: 1.0000 | INHALATION_SPRAY | Freq: Four times a day (QID) | RESPIRATORY_TRACT | 0 refills | Status: DC | PRN

## 2018-02-03 NOTE — Progress Notes (Signed)
D/w pt about prev URI sx and cough with ER eval.  He thought he had pulled a muscle in his chest, causing discomfort.  He got better from prev, was back to baseline.  Got sick again on 01/29/18.  First noted rhinorrhea.  Then cough.  Some fevers, mainly at night, but not last night.  Cough continues.  No vomiting, no rash, no diarrhea.  No wheeze.  No facial pain.  No ear pain.  No ST.  His chest and back are sore, likely from coughing.  He has been using cough drops with some relief.  OTC cough medicine isn't helping much.  No CP or back pain unless he is coughing.  Cough is dry.    Meds, vitals, and allergies reviewed.   ROS: Per HPI unless specifically indicated in ROS section   GEN: nad, alert and oriented HEENT: mucous membranes moist, tm w/o erythema, nasal exam w/o erythema, scant clear discharge noted,  OP wnl NECK: supple w/o LA CV: rrr.   PULM: ctab, no inc wob EXT: no edema Chest wall diffusely sore to the touch B

## 2018-02-03 NOTE — Assessment & Plan Note (Signed)
Well appearing.  Nontoxic.  D/w pt.  Ctab.  Likely with chest wall pain from cough.  He can call about getting a flu shot here next week assuming he feels better.   Tessalon pills and albuterol inhaler as needed for the cough.  Routine cautions given.  He agrees.  Exam not c/w flu or PNA.   Update me as needed.

## 2018-02-03 NOTE — Patient Instructions (Signed)
Call about getting a flu shot here next week assuming you feel better.   Tessalon pills and albuterol inhaler as needed for the cough.  Take care.  Glad to see you.  Update me as needed.

## 2018-02-19 ENCOUNTER — Other Ambulatory Visit: Payer: Self-pay | Admitting: Family Medicine

## 2018-02-19 ENCOUNTER — Ambulatory Visit (INDEPENDENT_AMBULATORY_CARE_PROVIDER_SITE_OTHER): Payer: 59

## 2018-02-19 DIAGNOSIS — Z23 Encounter for immunization: Secondary | ICD-10-CM | POA: Diagnosis not present

## 2018-02-19 NOTE — Telephone Encounter (Signed)
Patient given one refill of Pravastatin and Amlodipine.  Does patient need CPE, last one in 2017 although some acute OV's since that time?

## 2018-02-20 ENCOUNTER — Encounter: Payer: Self-pay | Admitting: *Deleted

## 2018-02-20 NOTE — Telephone Encounter (Signed)
Letter mailed

## 2018-02-20 NOTE — Telephone Encounter (Signed)
Reasonable for CPE later this spring.  Thanks.

## 2018-05-24 ENCOUNTER — Other Ambulatory Visit: Payer: Self-pay | Admitting: Family Medicine

## 2018-05-25 ENCOUNTER — Encounter: Payer: Self-pay | Admitting: *Deleted

## 2018-09-09 ENCOUNTER — Other Ambulatory Visit: Payer: Self-pay | Admitting: Family Medicine

## 2018-09-09 NOTE — Telephone Encounter (Signed)
Electronic refill request Last office visit 02/03/18/svuyr Last lipid 01/16/17 Letter mailed to patient 05/25/18 advising that he needs to schedule an appointment No upcoming appointment scheduled

## 2018-09-09 NOTE — Telephone Encounter (Signed)
Please call and schedule appointment as instructed. 

## 2018-09-09 NOTE — Telephone Encounter (Signed)
Left message for patient to call back-Daniel [name], RMA  

## 2018-09-09 NOTE — Telephone Encounter (Signed)
Sent.  Needs CPE scheduled.  Thanks.

## 2018-09-10 NOTE — Telephone Encounter (Signed)
Appointment made by Angelene Giovanni, RMA

## 2018-09-17 ENCOUNTER — Ambulatory Visit (INDEPENDENT_AMBULATORY_CARE_PROVIDER_SITE_OTHER): Payer: 59

## 2018-09-17 DIAGNOSIS — Z23 Encounter for immunization: Secondary | ICD-10-CM | POA: Diagnosis not present

## 2018-10-11 ENCOUNTER — Other Ambulatory Visit: Payer: Self-pay | Admitting: Family Medicine

## 2018-10-21 ENCOUNTER — Ambulatory Visit (INDEPENDENT_AMBULATORY_CARE_PROVIDER_SITE_OTHER): Payer: 59 | Admitting: Family Medicine

## 2018-10-21 ENCOUNTER — Encounter: Payer: Self-pay | Admitting: Family Medicine

## 2018-10-21 VITALS — BP 160/100 | HR 72 | Temp 98.8°F | Ht 71.0 in | Wt 275.5 lb

## 2018-10-21 DIAGNOSIS — Z0000 Encounter for general adult medical examination without abnormal findings: Secondary | ICD-10-CM

## 2018-10-21 DIAGNOSIS — M25561 Pain in right knee: Secondary | ICD-10-CM

## 2018-10-21 DIAGNOSIS — E782 Mixed hyperlipidemia: Secondary | ICD-10-CM

## 2018-10-21 DIAGNOSIS — I159 Secondary hypertension, unspecified: Secondary | ICD-10-CM

## 2018-10-21 DIAGNOSIS — I10 Essential (primary) hypertension: Secondary | ICD-10-CM

## 2018-10-21 DIAGNOSIS — Z7189 Other specified counseling: Secondary | ICD-10-CM

## 2018-10-21 DIAGNOSIS — K222 Esophageal obstruction: Secondary | ICD-10-CM

## 2018-10-21 DIAGNOSIS — K219 Gastro-esophageal reflux disease without esophagitis: Secondary | ICD-10-CM

## 2018-10-21 DIAGNOSIS — Z125 Encounter for screening for malignant neoplasm of prostate: Secondary | ICD-10-CM | POA: Diagnosis not present

## 2018-10-21 DIAGNOSIS — M25562 Pain in left knee: Secondary | ICD-10-CM

## 2018-10-21 MED ORDER — AMLODIPINE BESYLATE 10 MG PO TABS: ORAL_TABLET | ORAL | 12 refills | Status: DC

## 2018-10-21 MED ORDER — PRAVASTATIN SODIUM 40 MG PO TABS
40.0000 mg | ORAL_TABLET | Freq: Every day | ORAL | 12 refills | Status: DC
Start: 2018-10-21 — End: 2019-11-23

## 2018-10-21 MED ORDER — LOSARTAN POTASSIUM 50 MG PO TABS: 75.0000 mg | ORAL_TABLET | ORAL | 12 refills | Status: DC

## 2018-10-21 NOTE — Patient Instructions (Addendum)
We will call about your referral.  Rosaria Ferries or Azalee Course will call you if you don't see one of them on the way out.  Go to the lab on the way out.  We'll contact you with your lab report. Take care.  Glad to see you.

## 2018-10-21 NOTE — Progress Notes (Signed)
CPE- See plan.  Routine anticipatory guidance given to patient.  See health maintenance.  The possibility exists that previously documented standard health maintenance information may have been brought forward from a previous encounter into this note.  If needed, that same information has been updated to reflect the current situation based on today's encounter.    Tetanus 2014- He had a needle stick at work with [employer]. Had f/u testing and tetanus at that point.  HIV and HCV neg 2014 per patient report with the needle stick event 2014 Flu shot prev done.  Shingles and PNA shot d/w pt.  Colonoscopy 2015 PSA screening d/w pt.   Living will. Would have his daughter designated if he were incapacitated.  Diet and exercise d/w pt.    B knee pain.  Taking aleve prn for knee pain.  Tramadol didn't help.  Pain is worse since last year.  No trauma.   nsaid cautions d/w pt.   Hypertension:    Using medication without problems or lightheadedness: yes Chest pain with exertion:no Edema:no Short of breath:no Average home BPs: lower at home, usually 125/80 at home.    Elevated Cholesterol: Using medications without problems:yes Muscle aches: likely not from statin.  Diet compliance: yes Exercise: limited by exercise.   PMH and SH reviewed  Meds, vitals, and allergies reviewed.   ROS: Per HPI.  Unless specifically indicated otherwise in HPI, the patient denies:  General: fever. Eyes: acute vision changes ENT: sore throat Cardiovascular: chest pain Respiratory: SOB GI: vomiting GU: dysuria Musculoskeletal: acute back pain Derm: acute rash Neuro: acute motor dysfunction Psych: worsening mood Endocrine: polydipsia Heme: bleeding Allergy: hayfever  GEN: nad, alert and oriented HEENT: mucous membranes moist NECK: supple w/o LA CV: rrr. PULM: ctab, no inc wob ABD: soft, +bs EXT: no edema SKIN: no acute rash Medial and lateral knee joint line ttp w/crepitus.  Similar exam on the  bilateral knees.  Able to bear weight.

## 2018-10-22 LAB — COMPREHENSIVE METABOLIC PANEL
ALT: 14 U/L (ref 0–53)
AST: 16 U/L (ref 0–37)
Albumin: 4.4 g/dL (ref 3.5–5.2)
Alkaline Phosphatase: 58 U/L (ref 39–117)
BILIRUBIN TOTAL: 0.3 mg/dL (ref 0.2–1.2)
BUN: 19 mg/dL (ref 6–23)
CO2: 31 meq/L (ref 19–32)
Calcium: 9.7 mg/dL (ref 8.4–10.5)
Chloride: 103 mEq/L (ref 96–112)
Creatinine, Ser: 1.22 mg/dL (ref 0.40–1.50)
GFR: 78.6 mL/min (ref >60.00)
Glucose, Bld: 100 mg/dL — ABNORMAL HIGH (ref 70–99)
POTASSIUM: 4.2 meq/L (ref 3.5–5.1)
Sodium: 140 mEq/L (ref 135–145)
Total Protein: 7.6 g/dL (ref 6.0–8.3)

## 2018-10-22 LAB — LIPID PANEL
Cholesterol: 160 mg/dL (ref 0–200)
HDL: 41.2 mg/dL (ref >39.00)
LDL CALC: 84 mg/dL (ref 0–99)
NonHDL: 118.79
TRIGLYCERIDES: 174 mg/dL — AB (ref 0.0–149.0)
Total CHOL/HDL Ratio: 4
VLDL: 34.8 mg/dL (ref 0.0–40.0)

## 2018-10-22 LAB — PSA: PSA: 6.48 ng/mL — ABNORMAL HIGH (ref 0.10–4.00)

## 2018-10-22 NOTE — Assessment & Plan Note (Signed)
Needs continue work on diet and exercise.  No adverse effect on medication.  See notes on labs.  Continue statin.

## 2018-10-22 NOTE — Assessment & Plan Note (Signed)
Living will.  Would have his daughter designated if he were incapacitated.   

## 2018-10-22 NOTE — Assessment & Plan Note (Signed)
Tetanus 2014- He had a needle stick at work with [employer]. Had f/u testing and tetanus at that point.  HIV and HCV neg 2014 per patient report with the needle stick event 2014 Flu shot prev done.  Shingles and PNA shot d/w pt.  Colonoscopy 2015 PSA screening d/w pt. ordered 2019. Living will. Would have his daughter designated if he were incapacitated.  Diet and exercise d/w pt.

## 2018-10-22 NOTE — Assessment & Plan Note (Signed)
He is done better overall since he cut out greasy foods.  He is off PPI currently.  Doing well.

## 2018-10-22 NOTE — Assessment & Plan Note (Signed)
Continue work on diet and exercise.  Blood pressure improved at home.  He can calibrate his cuff episodically just to make sure he is not getting artificially low readings.  He likely has whitecoat hypertension.  See notes on labs.

## 2018-10-22 NOTE — Assessment & Plan Note (Signed)
History of.  Swallowing well.

## 2018-10-22 NOTE — Assessment & Plan Note (Signed)
Check creatinine given episodic NSAID use.  NSAID cautions discussed with patient.  Refer to orthopedics.  He agrees.

## 2018-10-25 ENCOUNTER — Other Ambulatory Visit: Payer: Self-pay | Admitting: Family Medicine

## 2018-10-25 DIAGNOSIS — R972 Elevated prostate specific antigen [PSA]: Secondary | ICD-10-CM

## 2018-11-06 DIAGNOSIS — M170 Bilateral primary osteoarthritis of knee: Secondary | ICD-10-CM | POA: Diagnosis not present

## 2018-12-15 DIAGNOSIS — R972 Elevated prostate specific antigen [PSA]: Secondary | ICD-10-CM | POA: Diagnosis not present

## 2018-12-17 NOTE — Progress Notes (Signed)
Per orders of Dr. Damita Dunnings, injection of Flu given by Christena Deem. Patient tolerated injection well.

## 2019-01-04 DIAGNOSIS — R972 Elevated prostate specific antigen [PSA]: Secondary | ICD-10-CM | POA: Diagnosis not present

## 2019-01-11 DIAGNOSIS — C61 Malignant neoplasm of prostate: Secondary | ICD-10-CM | POA: Diagnosis not present

## 2019-01-11 DIAGNOSIS — R8279 Other abnormal findings on microbiological examination of urine: Secondary | ICD-10-CM | POA: Diagnosis not present

## 2019-01-27 ENCOUNTER — Other Ambulatory Visit: Payer: Self-pay

## 2019-01-27 ENCOUNTER — Encounter (HOSPITAL_COMMUNITY): Payer: Self-pay

## 2019-01-27 ENCOUNTER — Emergency Department (HOSPITAL_COMMUNITY): Payer: 59

## 2019-01-27 ENCOUNTER — Observation Stay (HOSPITAL_COMMUNITY)
Admission: EM | Admit: 2019-01-27 | Discharge: 2019-01-29 | Disposition: A | Discharge status: Home / Self Care | Payer: 59 | Attending: Internal Medicine | Admitting: Internal Medicine

## 2019-01-27 DIAGNOSIS — I200 Unstable angina: Principal | ICD-10-CM | POA: Insufficient documentation

## 2019-01-27 DIAGNOSIS — Z7982 Long term (current) use of aspirin: Secondary | ICD-10-CM | POA: Diagnosis not present

## 2019-01-27 DIAGNOSIS — I10 Essential (primary) hypertension: Secondary | ICD-10-CM | POA: Diagnosis present

## 2019-01-27 DIAGNOSIS — K219 Gastro-esophageal reflux disease without esophagitis: Secondary | ICD-10-CM | POA: Insufficient documentation

## 2019-01-27 DIAGNOSIS — E669 Obesity, unspecified: Secondary | ICD-10-CM | POA: Diagnosis not present

## 2019-01-27 DIAGNOSIS — E785 Hyperlipidemia, unspecified: Secondary | ICD-10-CM | POA: Insufficient documentation

## 2019-01-27 DIAGNOSIS — Z79899 Other long term (current) drug therapy: Secondary | ICD-10-CM | POA: Insufficient documentation

## 2019-01-27 DIAGNOSIS — R079 Chest pain, unspecified: Secondary | ICD-10-CM | POA: Diagnosis present

## 2019-01-27 DIAGNOSIS — R739 Hyperglycemia, unspecified: Secondary | ICD-10-CM | POA: Insufficient documentation

## 2019-01-27 DIAGNOSIS — R911 Solitary pulmonary nodule: Secondary | ICD-10-CM | POA: Diagnosis not present

## 2019-01-27 DIAGNOSIS — Z8249 Family history of ischemic heart disease and other diseases of the circulatory system: Secondary | ICD-10-CM | POA: Diagnosis not present

## 2019-01-27 DIAGNOSIS — E782 Mixed hyperlipidemia: Secondary | ICD-10-CM

## 2019-01-27 DIAGNOSIS — Z6837 Body mass index (BMI) 37.0-37.9, adult: Secondary | ICD-10-CM | POA: Diagnosis not present

## 2019-01-27 DIAGNOSIS — Z888 Allergy status to other drugs, medicaments and biological substances status: Secondary | ICD-10-CM | POA: Diagnosis not present

## 2019-01-27 DIAGNOSIS — Z885 Allergy status to narcotic agent status: Secondary | ICD-10-CM | POA: Diagnosis not present

## 2019-01-27 LAB — BASIC METABOLIC PANEL
Anion gap: 9 (ref 5–15)
BUN: 13 mg/dL (ref 6–20)
CALCIUM: 9 mg/dL (ref 8.9–10.3)
CO2: 26 mmol/L (ref 22–32)
Chloride: 103 mmol/L (ref 98–111)
Creatinine, Ser: 1.12 mg/dL (ref 0.61–1.24)
GFR calc Af Amer: >60 mL/min (ref >60)
GLUCOSE: 101 mg/dL — AB (ref 70–99)
POTASSIUM: 4 mmol/L (ref 3.5–5.1)
SODIUM: 138 mmol/L (ref 135–145)

## 2019-01-27 LAB — CBC
HCT: 41.8 % (ref 39.0–52.0)
HCT: 42.5 % (ref 39.0–52.0)
HCT: 43.1 % (ref 39.0–52.0)
HEMOGLOBIN: 13 g/dL (ref 13.0–17.0)
Hemoglobin: 12.7 g/dL — ABNORMAL LOW (ref 13.0–17.0)
Hemoglobin: 13.1 g/dL (ref 13.0–17.0)
MCH: 24.4 pg — AB (ref 26.0–34.0)
MCH: 24.7 pg — ABNORMAL LOW (ref 26.0–34.0)
MCH: 25.4 pg — AB (ref 26.0–34.0)
MCHC: 29.9 g/dL — ABNORMAL LOW (ref 30.0–36.0)
MCHC: 30.2 g/dL (ref 30.0–36.0)
MCHC: 31.3 g/dL (ref 30.0–36.0)
MCV: 81 fL (ref 80.0–100.0)
MCV: 81.7 fL (ref 80.0–100.0)
MCV: 81.8 fL (ref 80.0–100.0)
PLATELETS: 338 10*3/uL (ref 150–400)
Platelets: 349 10*3/uL (ref 150–400)
Platelets: 360 10*3/uL (ref 150–400)
RBC: 5.2 MIL/uL (ref 4.22–5.81)
RBC: 5.27 MIL/uL (ref 4.22–5.81)
RBC: 5.16 MIL/uL (ref 4.22–5.81)
RDW: 14.4 % (ref 11.5–15.5)
RDW: 14.5 % (ref 11.5–15.5)
RDW: 14.6 % (ref 11.5–15.5)
WBC: 6.3 10*3/uL (ref 4.0–10.5)
WBC: 6.3 10*3/uL (ref 4.0–10.5)
WBC: 7.1 10*3/uL (ref 4.0–10.5)
nRBC: 0 % (ref 0.0–0.2)
nRBC: 0 % (ref 0.0–0.2)
nRBC: 0 % (ref 0.0–0.2)

## 2019-01-27 LAB — CREATININE, SERUM
Creatinine, Ser: 1.08 mg/dL (ref 0.61–1.24)
GFR calc Af Amer: >60 mL/min (ref >60)

## 2019-01-27 LAB — LIPID PANEL
Cholesterol: 153 mg/dL (ref 0–200)
HDL: 32 mg/dL — ABNORMAL LOW (ref >40)
LDL Cholesterol: 105 mg/dL — ABNORMAL HIGH (ref 0–99)
TRIGLYCERIDES: 78 mg/dL (ref <150)
Total CHOL/HDL Ratio: 4.8 RATIO
VLDL: 16 mg/dL (ref 0–40)

## 2019-01-27 LAB — HEMOGLOBIN A1C
Hgb A1c MFr Bld: 6.4 % — ABNORMAL HIGH (ref 4.8–5.6)
Mean Plasma Glucose: 136.98 mg/dL

## 2019-01-27 LAB — TSH: TSH: 0.955 u[IU]/mL (ref 0.350–4.500)

## 2019-01-27 LAB — I-STAT TROPONIN, ED: TROPONIN I, POC: 0.01 ng/mL (ref 0.00–0.08)

## 2019-01-27 LAB — GLUCOSE, CAPILLARY
Glucose-Capillary: 114 mg/dL — ABNORMAL HIGH (ref 70–99)
Glucose-Capillary: 92 mg/dL (ref 70–99)

## 2019-01-27 LAB — TROPONIN I: Troponin I: <0.03 ng/mL (ref <0.03)

## 2019-01-27 LAB — BRAIN NATRIURETIC PEPTIDE: B NATRIURETIC PEPTIDE 5: 17.6 pg/mL (ref 0.0–100.0)

## 2019-01-27 MED ORDER — INSULIN ASPART 100 UNIT/ML ~~LOC~~ SOLN: 0.0000 [IU] | Freq: Three times a day (TID) | SUBCUTANEOUS | Status: DC

## 2019-01-27 MED ORDER — PANTOPRAZOLE SODIUM 40 MG PO TBEC
40.0000 mg | DELAYED_RELEASE_TABLET | Freq: Every day | ORAL | Status: DC
  Administered 2019-01-28 – 2019-01-29 (×2): 40 mg via ORAL
  Filled 2019-01-27 (×2): qty 1

## 2019-01-27 MED ORDER — ENOXAPARIN SODIUM 40 MG/0.4ML ~~LOC~~ SOLN
40.0000 mg | SUBCUTANEOUS | Status: DC
  Administered 2019-01-27 – 2019-01-29 (×2): 40 mg via SUBCUTANEOUS
  Filled 2019-01-27 (×2): qty 0.4

## 2019-01-27 MED ORDER — BISACODYL 5 MG PO TBEC: 5.0000 mg | DELAYED_RELEASE_TABLET | Freq: Every day | ORAL | Status: DC | PRN

## 2019-01-27 MED ORDER — HYDROCODONE-ACETAMINOPHEN 5-325 MG PO TABS: 1.0000 | ORAL_TABLET | ORAL | Status: DC | PRN

## 2019-01-27 MED ORDER — ONDANSETRON HCL 4 MG/2ML IJ SOLN: 4.0000 mg | Freq: Four times a day (QID) | INTRAMUSCULAR | Status: DC | PRN

## 2019-01-27 MED ORDER — ACETAMINOPHEN 325 MG PO TABS: 650.0000 mg | ORAL_TABLET | Freq: Four times a day (QID) | ORAL | Status: DC | PRN

## 2019-01-27 MED ORDER — ASPIRIN 81 MG PO CHEW
81.0000 mg | CHEWABLE_TABLET | Freq: Every day | ORAL | Status: DC
  Administered 2019-01-28 – 2019-01-29 (×2): 81 mg via ORAL
  Filled 2019-01-27 (×2): qty 1

## 2019-01-27 MED ORDER — SODIUM CHLORIDE 0.9% FLUSH
3.0000 mL | Freq: Once | INTRAVENOUS | Status: AC
  Administered 2019-01-27: 3 mL via INTRAVENOUS

## 2019-01-27 MED ORDER — ONDANSETRON HCL 4 MG PO TABS: 4.0000 mg | ORAL_TABLET | Freq: Four times a day (QID) | ORAL | Status: DC | PRN

## 2019-01-27 MED ORDER — AMLODIPINE BESYLATE 10 MG PO TABS
10.0000 mg | ORAL_TABLET | Freq: Every day | ORAL | Status: DC
  Administered 2019-01-28 – 2019-01-29 (×2): 10 mg via ORAL
  Filled 2019-01-27 (×2): qty 1

## 2019-01-27 MED ORDER — ACETAMINOPHEN 650 MG RE SUPP: 650.0000 mg | Freq: Four times a day (QID) | RECTAL | Status: DC | PRN

## 2019-01-27 MED ORDER — SENNOSIDES-DOCUSATE SODIUM 8.6-50 MG PO TABS: 1.0000 | ORAL_TABLET | Freq: Every evening | ORAL | Status: DC | PRN

## 2019-01-27 MED ORDER — IOPAMIDOL (ISOVUE-370) INJECTION 76%
INTRAVENOUS | Status: AC
  Administered 2019-01-27: 14:00:00
  Filled 2019-01-27: qty 100

## 2019-01-27 MED ORDER — PRAVASTATIN SODIUM 40 MG PO TABS
40.0000 mg | ORAL_TABLET | Freq: Every day | ORAL | Status: DC
  Administered 2019-01-28 – 2019-01-29 (×2): 40 mg via ORAL
  Filled 2019-01-27 (×2): qty 1

## 2019-01-27 NOTE — ED Triage Notes (Signed)
Pt BIB [facility] for eval of sharp L sided chest pain onset while walking his dog. Pt reports he drove himself to the fire station and EMS was called. 12 lead unremarkable for EMS, rec'd 324 ASA and 1 SL NTG w/ resolution of CP. Pt arrives CP and states SOB is resolved as well. NO other acute complaints. EMS reports recent dx of prostate CA

## 2019-01-27 NOTE — ED Notes (Addendum)
Pt returned from Radiology at this time.

## 2019-01-27 NOTE — ED Notes (Signed)
Heart healthy dinner tray ordered 

## 2019-01-27 NOTE — H&P (Signed)
History and Physical    Daniel [name] VEL:[id number] DOB: 05/26/1961 DOA: 01/27/2019  PCP: Tonia Ghent, MD Patient coming from: Home  Chief Complaint: Chest pain  HPI: Daniel [name] is a 58 y.o. male with medical history significant of essential hypertension, hyperlipidemia, obesity with BMI greater than 35 came to the hospital with complaints of chest pain.  Patient states he was walking his dog when he started experiencing left-sided sharp chest pain along with dizziness, lightheadedness and very short of breath.  He went back in his house but did not feel any better therefore his wife took him across the street to a fire station where he was given aspirin.  Pulse ox was okay but he was sent to the ER for evaluation. Patient tells me that off-and-on he has noticed this chest pain not necessarily with exertion but the shortness of breath and dizziness was new. He was here with similar pain back in 2018 and upon discharge he was advised to get outpatient stress test which he never really got it done.  In the ER today his basic labs are unremarkable.  First set of cardiac enzymes is negative.  EKG showed what appears to be new T wave inversion in V5-V6 when compared to old EKG.  CT of the chest is negative.  Medical team was requested to admit the patient.  Review of Systems: As per HPI otherwise 10 point review of systems negative.  Review of Systems Otherwise negative except as per HPI, including: General: Denies fever, chills, night sweats or unintended weight loss. Resp: Denies cough, wheezing, shortness of breath. Cardiac: Denies palpitations, orthopnea, paroxysmal nocturnal dyspnea. GI: Denies abdominal pain, nausea, vomiting, diarrhea or constipation GU: Denies dysuria, frequency, hesitancy or incontinence MS: Denies muscle aches, joint pain or swelling Neuro: Denies headache, neurologic deficits (focal weakness, numbness, tingling), abnormal gait Psych: Denies anxiety,  depression, SI/HI/AVH Skin: Denies new rashes or lesions ID: Denies sick contacts, exotic exposures, travel  Past Medical History:  Diagnosis Date  . 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 Chest pain    12/12:  Lexiscan Myoview demonstrated no ischemia or scar.  His EF was 41%.; Echocardiogram 01/16/12: LVH, EF 25-85%, diastolic function normal, no wall motion abnormalities, moderate LAE, mild RVE   . Esophageal stricture 11/08/2013  . GERD (gastroesophageal reflux disease) 02-18-12   none in 5 months  . Heart murmur 02-18-12   as child, not now  . Hyperlipidemia   . Hypertension   . Mass of colon   . Tubular adenoma of colon 11/2011  . Villous adenoma of right colon 03/12/2012   Laparoscopic right colectomy 02/25/2012     Past Surgical History:  Procedure Laterality Date  . APPENDECTOMY  2013  . COLON RESECTION  02/25/2012   Procedure: LAPAROSCOPIC RIGHT COLON RESECTION;  Surgeon: Adin Hector, MD;  Location: [facility];  Service: General;  Laterality: Right;  laparoscopic assisted right colectomy  . COLONOSCOPY  11/21/11  . LAPAROSCOPIC RIGHT COLON RESECTION  2013   adenomatous polyp    SOCIAL HISTORY:  reports that he has never smoked. He has never used smokeless tobacco. He reports that he does not drink alcohol or use drugs.  Allergies  Allergen Reactions  . Oxycodone Other (See Comments)    syncope  . Lisinopril Cough    FAMILY HISTORY: Family History  Problem Relation Age of Onset  . Heart disease Mother 54  MI, died at age 35  . Hypertension Mother   . Coronary artery disease Mother   . Alcohol abuse Father        cirrhoisis of liver-- ETOH abuse  . Heart disease Brother   . Colon cancer Neg Hx   . Prostate cancer Neg Hx   . Esophageal cancer Neg Hx   . Rectal cancer Neg Hx   . Stomach cancer Neg Hx      Prior to Admission medications   Medication Sig Start Date End  Date Taking? Authorizing Provider  amLODipine (NORVASC) 10 MG tablet TAKE 1 TABLET BY MOUTH EVERY DAY FOR BLOOD PRESSURE Patient taking differently: Take 10 mg by mouth daily.  10/21/18  Yes Tonia Ghent, MD  aspirin 81 MG tablet Take 81 mg by mouth daily.   Yes [provider]  pantoprazole (PROTONIX) 40 MG tablet Take 40 mg by mouth daily.   Yes [provider]  pravastatin (PRAVACHOL) 40 MG tablet Take 1 tablet (40 mg total) by mouth daily. 10/21/18  Yes Tonia Ghent, MD  albuterol (PROVENTIL HFA;VENTOLIN HFA) 108 (90 Base) MCG/ACT inhaler Inhale 1-2 puffs into the lungs every 6 (six) hours as needed for wheezing or shortness of breath. Patient not taking: Reported on 01/27/2019 02/03/18   Tonia Ghent, MD  losartan (COZAAR) 50 MG tablet Take 1.5 tablets (75 mg total) by mouth every morning. Patient not taking: Reported on 01/27/2019 10/21/18   Tonia Ghent, MD    Physical Exam: Vitals:   01/27/19 1200 01/27/19 1215 01/27/19 1230 01/27/19 1300  BP: 127/74 126/76 124/80 123/78  Pulse: 83 76 80 75  Resp: (!) 22 (!) 22 17 20   Temp:      TempSrc:      SpO2: 98% 99% 97% 98%  Weight:      Height:          Constitutional: NAD, calm, comfortable Eyes: PERRL, lids and conjunctivae normal ENMT: Mucous membranes are moist. Posterior pharynx clear of any exudate or lesions.Normal dentition.  Neck: normal, supple, no masses, no thyromegaly Respiratory: clear to auscultation bilaterally, no wheezing, no crackles. Normal respiratory effort. No accessory muscle use.  Cardiovascular: Regular rate and rhythm, no murmurs / rubs / gallops. No extremity edema. 2+ pedal pulses. No carotid bruits.  Abdomen: no tenderness, no masses palpated. No hepatosplenomegaly. Bowel sounds positive.  Musculoskeletal: no clubbing / cyanosis. No joint deformity upper and lower extremities. Good ROM, no contractures. Normal muscle tone.  Skin: no rashes, lesions, ulcers. No  induration Neurologic: CN 2-12 grossly intact. Sensation intact, DTR normal. Strength 5/5 in all 4.  Psychiatric: Normal judgment and insight. Alert and oriented x 3. Normal mood.     Labs on Admission: I have personally reviewed following labs and imaging studies  CBC: Recent Labs  Lab 01/27/19 1139  WBC 6.3  HGB 12.7*  HCT 42.5  MCV 81.7  PLT 161   Basic Metabolic Panel: Recent Labs  Lab 01/27/19 1139  NA 138  K 4.0  CL 103  CO2 26  GLUCOSE 101*  BUN 13  CREATININE 1.12  CALCIUM 9.0   GFR: Estimated Creatinine Clearance: 93.7 mL/min (by C-G formula based on SCr of 1.12 mg/dL). Liver Function Tests: No results for input(s): AST, ALT, ALKPHOS, BILITOT, PROT, ALBUMIN in the last 168 hours. No results for input(s): LIPASE, AMYLASE in the last 168 hours. No results for input(s): AMMONIA in the last 168 hours. Coagulation Profile: No results for input(s): INR,  PROTIME in the last 168 hours. Cardiac Enzymes: No results for input(s): CKTOTAL, CKMB, CKMBINDEX, TROPONINI in the last 168 hours. BNP (last 3 results) No results for input(s): PROBNP in the last 8760 hours. HbA1C: No results for input(s): HGBA1C in the last 72 hours. CBG: No results for input(s): GLUCAP in the last 168 hours. Lipid Profile: No results for input(s): CHOL, HDL, LDLCALC, TRIG, CHOLHDL, LDLDIRECT in the last 72 hours. Thyroid Function Tests: No results for input(s): TSH, T4TOTAL, FREET4, T3FREE, THYROIDAB in the last 72 hours. Anemia Panel: No results for input(s): VITAMINB12, FOLATE, FERRITIN, TIBC, IRON, RETICCTPCT in the last 72 hours. Urine analysis:    Component Value Date/Time   COLORURINE YELLOW 02/18/2012 [place] 02/18/2012 1422   LABSPEC 1.020 02/18/2012 1422   PHURINE 7.5 02/18/2012 1422   GLUCOSEU NEGATIVE 02/18/2012 1422   HGBUR NEGATIVE 02/18/2012 1422   BILIRUBINUR NEGATIVE 02/18/2012 1422   KETONESUR NEGATIVE 02/18/2012 1422   PROTEINUR NEGATIVE  02/18/2012 1422   UROBILINOGEN 0.2 02/18/2012 1422   NITRITE NEGATIVE 02/18/2012 1422   LEUKOCYTESUR NEGATIVE 02/18/2012 1422   Sepsis Labs: !!!!!!!!!!!!!!!!!!!!!!!!!!!!!!!!!!!!!!!!!!!! @LABRCNTIP (procalcitonin:4,lacticidven:4) )No results found for this or any previous visit (from the past 240 hour(s)).   Radiological Exams on Admission: Dg Chest 2 View  Result Date: 01/27/2019 CLINICAL DATA:  58 year old male with a history of chest pain and shortness of breath EXAM: CHEST - 2 VIEW COMPARISON:  01/10/2018 FINDINGS: Cardiomediastinal silhouette unchanged in size and contour. No evidence of central vascular congestion. No pneumothorax or pleural effusion. No confluent airspace disease. No displaced fracture. IMPRESSION: No active cardiopulmonary disease. Electronically Signed   By: Corrie Mckusick D.O.   On: 01/27/2019 13:16   Ct Angio Chest Pe W/cm &/or Wo Cm  Result Date: 01/27/2019 CLINICAL DATA:  Left chest pain of sudden onset. EXAM: CT ANGIOGRAPHY CHEST WITH CONTRAST TECHNIQUE: Multidetector CT imaging of the chest was performed using the standard protocol during bolus administration of intravenous contrast. Multiplanar CT image reconstructions and MIPs were obtained to evaluate the vascular anatomy. CONTRAST:  75 cc ISOVUE-370 IOPAMIDOL (ISOVUE-370) INJECTION 76% COMPARISON:  Chest radiograph from earlier today. FINDINGS: Cardiovascular: The study is high quality for the evaluation of pulmonary embolism. There are no filling defects in the central, lobar, segmental or subsegmental pulmonary artery branches to suggest acute pulmonary embolism. Great vessels are normal in course and caliber. Normal heart size. No significant pericardial fluid/thickening. Mediastinum/Nodes: No discrete thyroid nodules. Unremarkable esophagus. No pathologically enlarged axillary, mediastinal or hilar lymph nodes. Lungs/Pleura: No pneumothorax. No pleural effusion. No acute consolidative airspace disease or lung  masses. Right middle lobe solid 3 mm pulmonary nodule (series 8/image 52). No additional significant pulmonary nodules. Upper abdomen: No acute abnormality. Musculoskeletal: No aggressive appearing focal osseous lesions. Mild thoracic spondylosis. Review of the MIP images confirms the above findings. IMPRESSION: 1. No pulmonary embolism.  No acute pulmonary disease. 2. Solid 3 mm right middle lobe pulmonary nodule. No follow-up needed if patient is low-risk. Non-contrast chest CT [name] be considered in 12 months if patient is high-risk. This recommendation follows the consensus statement: Guidelines for Management of Incidental Pulmonary Nodules Detected on CT Images:From the Fleischner Society 2017; published online before print (10.1148/[id number]). Electronically Signed   By: Ilona Sorrel M.D.   On: 01/27/2019 14:19     All images have been reviewed by me personally.  EKG: Independently reviewed.  T wave inversions noted in V5-V6.  New compared to previous EKG  Assessment/Plan Active Problems:  HYPERLIPIDEMIA   Hypertension   GERD (gastroesophageal reflux disease)   Chest pain    Atypical chest pain -Admit the patient for chest pain workup -Aspirin given at the fire department - EKG shows- T wave inversions noted in V5-V6 -Trend cardiac enzymes-first set negative, check TSH, A1c and lipid panel for risk stratification - Consult cardiology-inbox message sent -Supplemental oxygen as necessary -Workup- n.p.o. past midnight in anticipation for further work-up tomorrow -Nitro if necessary for chest pain -CT chest done in the ER is negative.  Essential hypertension - Continue 10 mg of Norvasc daily  Hyperlipidemia -Continue pravastatin daily, check lipid panel  GERD -Protonix daily.  DVT prophylaxis: Lovenox Code Status: Full code Family Communication: Wife and daughter at bedside Disposition Plan: To be determined Consults called: Sent staff message to cardiology for  consultation Admission status: Cardiac telemetry for observation   Time Spent: 65 minutes.  >50% of the time was devoted to discussing the patients care, assessment, plan and disposition with other care givers along with counseling the patient about the risks and benefits of treatment.    Ankit Arsenio Loader MD [facility]  If 7PM-7AM, please contact night-coverage [email or web address]  01/27/2019, 3:17 PM

## 2019-01-27 NOTE — ED Notes (Signed)
ED Provider at bedside. 

## 2019-01-27 NOTE — ED Notes (Signed)
Pt is ambulatory to restroom with a steady gait.  

## 2019-01-27 NOTE — ED Provider Notes (Signed)
[place] EMERGENCY DEPARTMENT Provider Note   CSN: [id number] Arrival date & time: 01/27/19  1130     History   Chief Complaint Chief Complaint  Patient presents with  . Chest Pain  . Shortness of Breath    HPI Daniel [name] is a 57 y.o. male.  He is presenting to the ED by ambulance for evaluation of chest pain and shortness of breath.  He said he is been sick for about a month.  He got diagnosed with prostate cancer and they did a biopsy and he got infected from that and is been on antibiotics for that and that gave him a lot of trouble.  He said he walked outside to do something in the yard and experienced acute onset of sharp stabbing left-sided chest pain under the breast that was about 5 out of 10 intensity and felt moderate shortness of breath and was diaphoretic.  He ultimately drove over to the fire station where they gave him an aspirin and a sublingual nitroglycerin which helped his symptoms.  He says his breathing feels still a little bit short and he is having 1 out of 10 chest pain.  He is never had this before.  No prior cardiac history.  He said he had chest pain work-up a couple years ago and they did not find anything.  Non-smoker no drugs  The history is provided by the patient.  Chest Pain  Pain location:  L chest Pain quality: sharp and stabbing   Pain radiates to:  Does not radiate Pain severity:  Moderate Onset quality:  Sudden Duration:  30 minutes Progression:  Improving Chronicity:  New Context comment:  Walking Relieved by:  Nitroglycerin and aspirin Worsened by:  Exertion Ineffective treatments:  None tried Associated symptoms: diaphoresis and shortness of breath   Associated symptoms: no abdominal pain, no altered mental status, no back pain, no cough, no fever, no headache, no heartburn, no lower extremity edema and no vomiting   Shortness of Breath  Associated symptoms: chest pain and diaphoresis   Associated symptoms: no  abdominal pain, no cough, no fever, no headaches, no rash, no sore throat and no vomiting     Past Medical History:  Diagnosis Date  . 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 Chest pain    12/12:  Lexiscan Myoview demonstrated no ischemia or scar.  His EF was 41%.; Echocardiogram 01/16/12: LVH, EF 66-06%, diastolic function normal, no wall motion abnormalities, moderate LAE, mild RVE   . Esophageal stricture 11/08/2013  . GERD (gastroesophageal reflux disease) 02-18-12   none in 5 months  . Heart murmur 02-18-12   as child, not now  . Hyperlipidemia   . Hypertension   . Mass of colon   . Tubular adenoma of colon 11/2011  . Villous adenoma of right colon 03/12/2012   Laparoscopic right colectomy 02/25/2012     Patient Active Problem List   Diagnosis Date Noted  . Pain in both knees 09/15/2017  . Advance care planning 12/25/2015  . Sciatica 08/09/2014  . Esophageal stricture 11/08/2013  . Syncope 12/06/2011  . Routine general medical examination at a health care facility 10/18/2011  . Personal history of adenomatous colonic polyps 10/18/2011  . GERD (gastroesophageal reflux disease) 06/23/2011  . Allergic rhinitis 05/25/2009  . Obesity 04/27/2009  . ERECTILE DYSFUNCTION 05/12/2008  . HYPERLIPIDEMIA 12/31/2007  . Hypertension 11/27/2007    Past  Surgical History:  Procedure Laterality Date  . APPENDECTOMY  2013  . COLON RESECTION  02/25/2012   Procedure: LAPAROSCOPIC RIGHT COLON RESECTION;  Surgeon: Adin Hector, MD;  Location: [facility];  Service: General;  Laterality: Right;  laparoscopic assisted right colectomy  . COLONOSCOPY  11/21/11  . LAPAROSCOPIC RIGHT COLON RESECTION  2013   adenomatous polyp        Home Medications    Prior to Admission medications   Medication Sig Start Date End Date Taking? Authorizing Provider  albuterol (PROVENTIL HFA;VENTOLIN HFA) 108 (90 Base) MCG/ACT inhaler  Inhale 1-2 puffs into the lungs every 6 (six) hours as needed for wheezing or shortness of breath. 02/03/18   Tonia Ghent, MD  amLODipine (NORVASC) 10 MG tablet TAKE 1 TABLET BY MOUTH EVERY DAY FOR BLOOD PRESSURE 10/21/18   Tonia Ghent, MD  aspirin 81 MG tablet Take 81 mg by mouth daily.    [provider]  losartan (COZAAR) 50 MG tablet Take 1.5 tablets (75 mg total) by mouth every morning. 10/21/18   Tonia Ghent, MD  naproxen sodium (ALEVE) 220 MG tablet Take 440 mg by mouth daily as needed (with food).    [provider]  pravastatin (PRAVACHOL) 40 MG tablet Take 1 tablet (40 mg total) by mouth daily. 10/21/18   Tonia Ghent, MD    Family History Family History  Problem Relation Age of Onset  . Heart disease Mother 41       MI, died at age 81  . Hypertension Mother   . Coronary artery disease Mother   . Alcohol abuse Father        cirrhoisis of liver-- ETOH abuse  . Heart disease Brother   . Colon cancer Neg Hx   . Prostate cancer Neg Hx   . Esophageal cancer Neg Hx   . Rectal cancer Neg Hx   . Stomach cancer Neg Hx     Social History Social History   Tobacco Use  . Smoking status: Never Smoker  . Smokeless tobacco: Never Used  Substance Use Topics  . Alcohol use: No  . Drug use: No     Allergies   Oxycodone and Lisinopril   Review of Systems Review of Systems  Constitutional: Positive for diaphoresis. Negative for fever.  HENT: Negative for sore throat.   Eyes: Negative for visual disturbance.  Respiratory: Positive for shortness of breath. Negative for cough.   Cardiovascular: Positive for chest pain.  Gastrointestinal: Negative for abdominal pain, heartburn and vomiting.  Genitourinary: Negative for dysuria.  Musculoskeletal: Negative for back pain.  Skin: Negative for rash.  Neurological: Negative for headaches.     Physical Exam Updated Vital Signs BP 131/77   Pulse 84   Temp 98.2 F (36.8 C) (Oral)   Resp 14   Ht  5\' 10"  (1.778 m)   Wt 117.9 kg   SpO2 97%   BMI 37.31 kg/m   Physical Exam Vitals signs and nursing note reviewed.  Constitutional:      Appearance: He is well-developed.  HENT:     Head: Normocephalic and atraumatic.  Eyes:     Conjunctiva/sclera: Conjunctivae normal.  Neck:     Musculoskeletal: Neck supple.  Cardiovascular:     Rate and Rhythm: Normal rate and regular rhythm.     Heart sounds: Normal heart sounds. No murmur.  Pulmonary:     Effort: Pulmonary effort is normal. No respiratory distress.     Breath sounds: Normal  breath sounds.  Abdominal:     Palpations: Abdomen is soft.     Tenderness: There is no abdominal tenderness.  Musculoskeletal:     Right lower leg: He exhibits no tenderness. No edema.     Left lower leg: He exhibits no tenderness. No edema.  Skin:    General: Skin is warm and dry.     Capillary Refill: Capillary refill takes less than 2 seconds.  Neurological:     General: No focal deficit present.     Mental Status: He is alert and oriented to person, place, and time.      ED Treatments / Results  Labs (all labs ordered are listed, but only abnormal results are displayed) Labs Reviewed  BASIC METABOLIC PANEL - Abnormal; Notable for the following components:      Result Value   Glucose, Bld 101 (*)    All other components within normal limits  CBC - Abnormal; Notable for the following components:   Hemoglobin 12.7 (*)    MCH 24.4 (*)    MCHC 29.9 (*)    All other components within normal limits  CBC - Abnormal; Notable for the following components:   MCH 24.7 (*)    All other components within normal limits  HEMOGLOBIN A1C - Abnormal; Notable for the following components:   Hgb A1c MFr Bld 6.4 (*)    All other components within normal limits  LIPID PANEL - Abnormal; Notable for the following components:   HDL 32 (*)    LDL Cholesterol 105 (*)    All other components within normal limits  BRAIN NATRIURETIC PEPTIDE  CREATININE,  SERUM  TSH  HIV ANTIBODY (ROUTINE TESTING W REFLEX)  TROPONIN I  TROPONIN I  COMPREHENSIVE METABOLIC PANEL  CBC  I-STAT TROPONIN, ED    EKG EKG Interpretation  Date/Time:  Wednesday January 27 2019 11:36:59 EST Ventricular Rate:  87 PR Interval:    QRS Duration: 90 QT Interval:  321 QTC Calculation: 387 R Axis:   81 Text Interpretation:  Sinus rhythm Abnormal T, consider ischemia, diffuse leads new twi compared with prior 1/19 Confirmed by Aletta Edouard [phone]) on 01/27/2019 11:57:49 AM   Radiology Dg Chest 2 View  Result Date: 01/27/2019 CLINICAL DATA:  58 year old male with a history of chest pain and shortness of breath EXAM: CHEST - 2 VIEW COMPARISON:  01/10/2018 FINDINGS: Cardiomediastinal silhouette unchanged in size and contour. No evidence of central vascular congestion. No pneumothorax or pleural effusion. No confluent airspace disease. No displaced fracture. IMPRESSION: No active cardiopulmonary disease. Electronically Signed   By: Corrie Mckusick D.O.   On: 01/27/2019 13:16   Ct Angio Chest Pe W/cm &/or Wo Cm  Result Date: 01/27/2019 CLINICAL DATA:  Left chest pain of sudden onset. EXAM: CT ANGIOGRAPHY CHEST WITH CONTRAST TECHNIQUE: Multidetector CT imaging of the chest was performed using the standard protocol during bolus administration of intravenous contrast. Multiplanar CT image reconstructions and MIPs were obtained to evaluate the vascular anatomy. CONTRAST:  75 cc ISOVUE-370 IOPAMIDOL (ISOVUE-370) INJECTION 76% COMPARISON:  Chest radiograph from earlier today. FINDINGS: Cardiovascular: The study is high quality for the evaluation of pulmonary embolism. There are no filling defects in the central, lobar, segmental or subsegmental pulmonary artery branches to suggest acute pulmonary embolism. Great vessels are normal in course and caliber. Normal heart size. No significant pericardial fluid/thickening. Mediastinum/Nodes: No discrete thyroid nodules. Unremarkable  esophagus. No pathologically enlarged axillary, mediastinal or hilar lymph nodes. Lungs/Pleura: No pneumothorax. No pleural effusion. No  acute consolidative airspace disease or lung masses. Right middle lobe solid 3 mm pulmonary nodule (series 8/image 52). No additional significant pulmonary nodules. Upper abdomen: No acute abnormality. Musculoskeletal: No aggressive appearing focal osseous lesions. Mild thoracic spondylosis. Review of the MIP images confirms the above findings. IMPRESSION: 1. No pulmonary embolism.  No acute pulmonary disease. 2. Solid 3 mm right middle lobe pulmonary nodule. No follow-up needed if patient is low-risk. Non-contrast chest CT can be considered in 12 months if patient is high-risk. This recommendation follows the consensus statement: Guidelines for Management of Incidental Pulmonary Nodules Detected on CT Images:From the Fleischner Society 2017; published online before print (10.1148/[id number]). Electronically Signed   By: Ilona Sorrel M.D.   On: 01/27/2019 14:19    Procedures Procedures (including critical care time)  Medications Ordered in ED Medications  sodium chloride flush (NS) 0.9 % injection 3 mL (3 mLs Intravenous Given 01/27/19 1149)     Initial Impression / Assessment and Plan / ED Course  I have reviewed the triage vital signs and the nursing notes.  Pertinent labs & imaging results that were available during my care of the patient were reviewed by me and considered in my medical decision making (see chart for details).  Clinical Course as of Jan 27 1754  Wed Feb 12, 564  3767 57 year old male with no known coronary disease but multiple risk factors here with exertional chest pain shortness of breath diaphoresis.  Symptoms are improved after rest and nitro.  First troponin negative.  Heart score would be a 6.   [MB]  1423 CT PE does not show any PE.  Does show pulmonary nodule which will need follow-up.  I placed a call into hospitalist  service for admission.   [MB]  0177 Discussed with Dr. Reesa Chew from the hospitalist service who will evaluate the patient for admission.   [MB]    Clinical Course User Index [MB] Hayden Rasmussen, MD     Final Clinical Impressions(s) / ED Diagnoses   Final diagnoses:  Unstable angina [facility])    ED Discharge Orders    None       Hayden Rasmussen, MD 01/27/19 1757

## 2019-01-28 ENCOUNTER — Encounter (HOSPITAL_COMMUNITY)
Admission: EM | Disposition: A | Discharge status: Home / Self Care | Payer: Self-pay | Source: Home / Self Care | Attending: Emergency Medicine

## 2019-01-28 DIAGNOSIS — I10 Essential (primary) hypertension: Secondary | ICD-10-CM | POA: Diagnosis not present

## 2019-01-28 DIAGNOSIS — I200 Unstable angina: Secondary | ICD-10-CM | POA: Diagnosis not present

## 2019-01-28 DIAGNOSIS — R739 Hyperglycemia, unspecified: Secondary | ICD-10-CM | POA: Diagnosis not present

## 2019-01-28 DIAGNOSIS — E782 Mixed hyperlipidemia: Secondary | ICD-10-CM | POA: Diagnosis not present

## 2019-01-28 HISTORY — PX: LEFT HEART CATH AND CORONARY ANGIOGRAPHY: CATH118249

## 2019-01-28 LAB — COMPREHENSIVE METABOLIC PANEL
ALT: 24 U/L (ref 0–44)
AST: 17 U/L (ref 15–41)
Albumin: 3.3 g/dL — ABNORMAL LOW (ref 3.5–5.0)
Alkaline Phosphatase: 49 U/L (ref 38–126)
Anion gap: 10 (ref 5–15)
BUN: 12 mg/dL (ref 6–20)
CO2: 27 mmol/L (ref 22–32)
Calcium: 8.8 mg/dL — ABNORMAL LOW (ref 8.9–10.3)
Chloride: 102 mmol/L (ref 98–111)
Creatinine, Ser: 1.12 mg/dL (ref 0.61–1.24)
GFR calc Af Amer: >60 mL/min (ref >60)
GFR calc non Af Amer: >60 mL/min (ref >60)
GLUCOSE: 97 mg/dL (ref 70–99)
Potassium: 3.8 mmol/L (ref 3.5–5.1)
Sodium: 139 mmol/L (ref 135–145)
TOTAL PROTEIN: 7.3 g/dL (ref 6.5–8.1)
Total Bilirubin: 0.6 mg/dL (ref 0.3–1.2)

## 2019-01-28 LAB — TROPONIN I: Troponin I: <0.03 ng/mL (ref <0.03)

## 2019-01-28 LAB — GLUCOSE, CAPILLARY
GLUCOSE-CAPILLARY: 93 mg/dL (ref 70–99)
Glucose-Capillary: 84 mg/dL (ref 70–99)
Glucose-Capillary: 73 mg/dL (ref 70–99)
Glucose-Capillary: 82 mg/dL (ref 70–99)

## 2019-01-28 LAB — HIV ANTIBODY (ROUTINE TESTING W REFLEX): HIV Screen 4th Generation wRfx: NONREACTIVE

## 2019-01-28 SURGERY — LEFT HEART CATH AND CORONARY ANGIOGRAPHY
Anesthesia: LOCAL

## 2019-01-28 MED ORDER — HEPARIN (PORCINE) IN NACL 1000-0.9 UT/500ML-% IV SOLN
INTRAVENOUS | Status: AC
  Filled 2019-01-28: qty 1000

## 2019-01-28 MED ORDER — SODIUM CHLORIDE 0.9 % WEIGHT BASED INFUSION: 1.0000 mL/kg/h | INTRAVENOUS | Status: DC

## 2019-01-28 MED ORDER — SODIUM CHLORIDE 0.9% FLUSH: 3.0000 mL | INTRAVENOUS | Status: DC | PRN

## 2019-01-28 MED ORDER — LIDOCAINE HCL (PF) 1 % IJ SOLN
INTRAMUSCULAR | Status: AC
  Filled 2019-01-28: qty 30

## 2019-01-28 MED ORDER — IOHEXOL 350 MG/ML SOLN
INTRAVENOUS | Status: DC | PRN
  Administered 2019-01-28: 70 mL via INTRACARDIAC

## 2019-01-28 MED ORDER — FENTANYL CITRATE (PF) 100 MCG/2ML IJ SOLN
INTRAMUSCULAR | Status: DC | PRN
  Administered 2019-01-28: 50 ug via INTRAVENOUS

## 2019-01-28 MED ORDER — MIDAZOLAM HCL 2 MG/2ML IJ SOLN
INTRAMUSCULAR | Status: AC
  Filled 2019-01-28: qty 2

## 2019-01-28 MED ORDER — SODIUM CHLORIDE 0.9 % WEIGHT BASED INFUSION: 3.0000 mL/kg/h | INTRAVENOUS | Status: DC

## 2019-01-28 MED ORDER — SODIUM CHLORIDE 0.9 % IV SOLN: 250.0000 mL | INTRAVENOUS | Status: DC | PRN

## 2019-01-28 MED ORDER — HEPARIN (PORCINE) IN NACL 1000-0.9 UT/500ML-% IV SOLN
INTRAVENOUS | Status: DC | PRN
  Administered 2019-01-28 (×2): 500 mL

## 2019-01-28 MED ORDER — HEPARIN SODIUM (PORCINE) 1000 UNIT/ML IJ SOLN
INTRAMUSCULAR | Status: DC | PRN
  Administered 2019-01-28: 6000 [IU] via INTRAVENOUS

## 2019-01-28 MED ORDER — SODIUM CHLORIDE 0.9 % IV SOLN: INTRAVENOUS | Status: AC

## 2019-01-28 MED ORDER — SODIUM CHLORIDE 0.9% FLUSH: 3.0000 mL | Freq: Two times a day (BID) | INTRAVENOUS | Status: DC

## 2019-01-28 MED ORDER — FENTANYL CITRATE (PF) 100 MCG/2ML IJ SOLN
INTRAMUSCULAR | Status: AC
  Filled 2019-01-28: qty 2

## 2019-01-28 MED ORDER — HEPARIN SODIUM (PORCINE) 1000 UNIT/ML IJ SOLN
INTRAMUSCULAR | Status: AC
  Filled 2019-01-28: qty 1

## 2019-01-28 MED ORDER — VERAPAMIL HCL 2.5 MG/ML IV SOLN
INTRA_ARTERIAL | Status: DC | PRN
  Administered 2019-01-28: 10 mL via INTRA_ARTERIAL

## 2019-01-28 MED ORDER — MIDAZOLAM HCL 2 MG/2ML IJ SOLN
INTRAMUSCULAR | Status: DC | PRN
  Administered 2019-01-28: 2 mg via INTRAVENOUS

## 2019-01-28 MED ORDER — LIDOCAINE HCL (PF) 1 % IJ SOLN
INTRAMUSCULAR | Status: DC | PRN
  Administered 2019-01-28: 2 mL via INTRADERMAL

## 2019-01-28 MED ORDER — SODIUM CHLORIDE 0.9 % IV SOLN
250.0000 mL | INTRAVENOUS | Status: DC | PRN
  Administered 2019-01-28: 250 mL via INTRAVENOUS

## 2019-01-28 MED ORDER — ANGIOPLASTY BOOK
Freq: Once | Status: DC
  Filled 2019-01-28: qty 1

## 2019-01-28 MED ORDER — VERAPAMIL HCL 2.5 MG/ML IV SOLN
INTRAVENOUS | Status: AC
  Filled 2019-01-28: qty 2

## 2019-01-28 SURGICAL SUPPLY — 14 items
CATH INFINITI 5 FR JL3.5 (CATHETERS) ×1 IMPLANT
CATH INFINITI 5FR ANG PIGTAIL (CATHETERS) ×1 IMPLANT
CATH INFINITI JR4 5F (CATHETERS) ×1 IMPLANT
CATH OPTITORQUE TIG 4.0 5F (CATHETERS) ×1 IMPLANT
DEVICE RAD COMP TR BAND LRG (VASCULAR PRODUCTS) ×1 IMPLANT
GLIDESHEATH SLEND A-KIT 6F 22G (SHEATH) ×1 IMPLANT
GUIDEWIRE INQWIRE 1.5J.035X260 (WIRE) IMPLANT
INQWIRE 1.5J .035X260CM (WIRE) ×2
KIT HEART LEFT (KITS) ×2 IMPLANT
PACK CARDIAC CATHETERIZATION (CUSTOM PROCEDURE TRAY) ×2 IMPLANT
SHEATH PROBE COVER 6X72 (BAG) ×2 IMPLANT
SYR MEDRAD MARK 7 150ML (SYRINGE) ×2 IMPLANT
TRANSDUCER W/STOPCOCK (MISCELLANEOUS) ×2 IMPLANT
TUBING CIL FLEX 10 FLL-RA (TUBING) ×2 IMPLANT

## 2019-01-28 NOTE — Progress Notes (Signed)
Progress Note    Daniel [name]  PFX:[id number] DOB: [birth year]  DOA: 01/27/2019 PCP: Tonia Ghent, MD    Brief Narrative:    Medical records reviewed and are as summarized below:  Daniel [name] is an 58 y.o. male  with medical history significant of essential hypertension, hyperlipidemia, obesity with BMI greater than 35 came to the hospital with complaints of chest pain.  Patient states he was walking his dog when he started experiencing left-sided sharp chest pain along with dizziness, lightheadedness and very short of breath.  He went back in his house but did not feel any better therefore his wife took him across the street to a fire station where he was given aspirin.    Assessment/Plan:   Active Problems:   HYPERLIPIDEMIA   Hypertension   GERD (gastroesophageal reflux disease)   Chest pain   Unstable angina (HCC)  chest pain and SOB with exertion - EKG shows- T wave inversions noted in V5-V6 -seen by cardiology-- plan for LHC: Given that he is considering surgery in the near future, would only pursue PCI if there is proximal or high risk disease on cath.  Otherwise, plan for medical management if possible so that surgery wouldn't need to be delayed.   Essential hypertension - Continue 10 mg of Norvasc daily  Hyperlipidemia -Continue pravastatin daily -LDL 105  GERD -Protonix daily  Elevated HgbA1c: 6.4 -trending up -lifestyle changes -diabetic coordinator consult appreciated -close outpatient follow up  incidental Lung nodule: Solid 3 mm right middle lobe pulmonary nodule. Non-contrast chest CT can be considered in 12 months if patient is high-risk.  Prostate cancer -outpatient follow up  obesity Body mass index is 37.31 kg/m.   Family Communication/Anticipated D/C date and plan/Code Status   DVT prophylaxis: Lovenox ordered. Code Status: Full Code.  Family Communication: wife at bedside Disposition Plan: pending cath  results   Medical Consultants:    cards   Subjective:   No current chest pain  Objective:    Vitals:   01/27/19 1800 01/28/19 0438 01/28/19 0832 01/28/19 1104  BP:  (!) 106/59 127/84 (!) 148/82  Pulse:  67 67 75  Resp:  16 16 18   Temp:  97.9 F (36.6 C) 98.3 F (36.8 C) 98.2 F (36.8 C)  TempSrc:  Oral Oral Oral  SpO2:  99% 98% 98%  Weight: 117.9 kg     Height: 5\' 10"  (1.778 m)      No intake or output data in the 24 hours ending 01/28/19 1556 Filed Weights   01/27/19 1145 01/27/19 1800  Weight: 117.9 kg 117.9 kg    Exam: In bed, NAD rrr No increased work of breathing No LE edema  Data Reviewed:   I have personally reviewed following labs and imaging studies:  Labs: Labs show the following:   Basic Metabolic Panel: Recent Labs  Lab 01/27/19 1139 01/27/19 1543 01/27/19 2330  NA 138  --  139  K 4.0  --  3.8  CL 103  --  102  CO2 26  --  27  GLUCOSE 101*  --  97  BUN 13  --  12  CREATININE 1.12 1.08 1.12  CALCIUM 9.0  --  8.8*   GFR Estimated Creatinine Clearance: 93.7 mL/min (by C-G formula based on SCr of 1.12 mg/dL). Liver Function Tests: Recent Labs  Lab 01/27/19 2330  AST 17  ALT 24  ALKPHOS 49  BILITOT 0.6  PROT 7.3  ALBUMIN 3.3*   No results  for input(s): LIPASE, AMYLASE in the last 168 hours. No results for input(s): AMMONIA in the last 168 hours. Coagulation profile No results for input(s): INR, PROTIME in the last 168 hours.  CBC: Recent Labs  Lab 01/27/19 1139 01/27/19 1543 01/27/19 2330  WBC 6.3 6.3 7.1  HGB 12.7* 13.0 13.1  HCT 42.5 43.1 41.8  MCV 81.7 81.8 81.0  PLT 338 349 360   Cardiac Enzymes: Recent Labs  Lab 01/27/19 1923 01/27/19 2330  TROPONINI <0.03 <0.03   BNP (last 3 results) No results for input(s): PROBNP in the last 8760 hours. CBG: Recent Labs  Lab 01/27/19 1812 01/27/19 2127 01/28/19 0621 01/28/19 1102  GLUCAP 114* 92 84 73   D-Dimer: No results for input(s): DDIMER in the last  72 hours. Hgb A1c: Recent Labs    01/27/19 1543  HGBA1C 6.4*   Lipid Profile: Recent Labs    01/27/19 1543  CHOL 153  HDL 32*  LDLCALC 105*  TRIG 78  CHOLHDL 4.8   Thyroid function studies: Recent Labs    01/27/19 1543  TSH 0.955   Anemia work up: No results for input(s): VITAMINB12, FOLATE, FERRITIN, TIBC, IRON, RETICCTPCT in the last 72 hours. Sepsis Labs: Recent Labs  Lab 01/27/19 1139 01/27/19 1543 01/27/19 2330  WBC 6.3 6.3 7.1    Microbiology No results found for this or any previous visit (from the past 240 hour(s)).  Procedures and diagnostic studies:  Dg Chest 2 View  Result Date: 01/27/2019 CLINICAL DATA:  58 year old male with a history of chest pain and shortness of breath EXAM: CHEST - 2 VIEW COMPARISON:  01/10/2018 FINDINGS: Cardiomediastinal silhouette unchanged in size and contour. No evidence of central vascular congestion. No pneumothorax or pleural effusion. No confluent airspace disease. No displaced fracture. IMPRESSION: No active cardiopulmonary disease. Electronically Signed   By: Corrie Mckusick D.O.   On: 01/27/2019 13:16   Ct Angio Chest Pe W/cm &/or Wo Cm  Result Date: 01/27/2019 CLINICAL DATA:  Left chest pain of sudden onset. EXAM: CT ANGIOGRAPHY CHEST WITH CONTRAST TECHNIQUE: Multidetector CT imaging of the chest was performed using the standard protocol during bolus administration of intravenous contrast. Multiplanar CT image reconstructions and MIPs were obtained to evaluate the vascular anatomy. CONTRAST:  75 cc ISOVUE-370 IOPAMIDOL (ISOVUE-370) INJECTION 76% COMPARISON:  Chest radiograph from earlier today. FINDINGS: Cardiovascular: The study is high quality for the evaluation of pulmonary embolism. There are no filling defects in the central, lobar, segmental or subsegmental pulmonary artery branches to suggest acute pulmonary embolism. Great vessels are normal in course and caliber. Normal heart size. No significant pericardial  fluid/thickening. Mediastinum/Nodes: No discrete thyroid nodules. Unremarkable esophagus. No pathologically enlarged axillary, mediastinal or hilar lymph nodes. Lungs/Pleura: No pneumothorax. No pleural effusion. No acute consolidative airspace disease or lung masses. Right middle lobe solid 3 mm pulmonary nodule (series 8/image 52). No additional significant pulmonary nodules. Upper abdomen: No acute abnormality. Musculoskeletal: No aggressive appearing focal osseous lesions. Mild thoracic spondylosis. Review of the MIP images confirms the above findings. IMPRESSION: 1. No pulmonary embolism.  No acute pulmonary disease. 2. Solid 3 mm right middle lobe pulmonary nodule. No follow-up needed if patient is low-risk. Non-contrast chest CT can be considered in 12 months if patient is high-risk. This recommendation follows the consensus statement: Guidelines for Management of Incidental Pulmonary Nodules Detected on CT Images:From the Fleischner Society 2017; published online before print (10.1148/radiol.9678938101). Electronically Signed   By: Ilona Sorrel M.D.   On: 01/27/2019 14:19  Medications:   . amLODipine  10 mg Oral Daily  . aspirin  81 mg Oral Daily  . enoxaparin (LOVENOX) injection  40 mg Subcutaneous Q24H  . insulin aspart  0-9 Units Subcutaneous TID WC  . pantoprazole  40 mg Oral Daily  . pravastatin  40 mg Oral Daily  . sodium chloride flush  3 mL Intravenous Q12H   Continuous Infusions: . sodium chloride    . sodium chloride       LOS: 0 days   Geradine Girt  [facility]   How to contact the [facility] Attending or Consulting provider [place] or covering provider during after hours [place], for this patient?  1. Check the care team in [facility] and look for a) attending/consulting [facility] provider listed and b) the [facility] team listed 2. Log into [email or web address] and use [place]'s universal password to access. If you do not have the password, please contact the hospital operator. 3. Locate  the [facility] provider you are looking for under [facility] and page to a number that you can be directly reached. 4. If you still have difficulty reaching the provider, please page the [facility] (Director on Call) for the Hospitalists listed on amion for assistance.  01/28/2019, 3:56 PM

## 2019-01-28 NOTE — Progress Notes (Signed)
Inpatient Diabetes Program Recommendations  AACE/ADA: New Consensus Statement on Inpatient Glycemic Control (2015)  Target Ranges:  Prepandial:   less than 140 mg/dL      Peak postprandial:   less than 180 mg/dL (1-2 hours)      Critically ill patients:  140 - 180 mg/dL   Spoke with patient and wife at bedside in regards to A1c level. Discussed what an A1c was. Discussed A1C results, 6.4% this admission. Discussed glucose and A1C goals. Discussed importance of checking CBGs and maintaining good CBG control to prevent long-term and short-term complications.   Discussed impact of nutrition, exercise, stress, sickness, and medications on diabetes control.   Discussed carbohydrates, carbohydrate goals per day and meal, along with portion sizes. Patient drinks mostly water with lemon. Patient and wife had several questions regarding diet and portion sizes.   Asked patient to follow up with his PCP in regards to his A1c level.   Thanks,  Tama Headings RN, MSN, BC-ADM Inpatient Diabetes Coordinator Team Pager [phone] (8a-5p)

## 2019-01-28 NOTE — Interval H&P Note (Signed)
History and Physical Interval Note:  01/28/2019 4:36 PM  Daniel [name]  has presented today for surgery, with the diagnosis of Unstable Angina.  The various methods of treatment have been discussed with the patient and family. After consideration of risks, benefits and other options for treatment, the patient has consented to  Procedure(s): LEFT HEART CATH AND CORONARY ANGIOGRAPHY (N/A) with possible PERCUTANEOUS CORONARY INTERVENTION as a surgical intervention .  The patient's history has been reviewed, patient examined, no change in status, stable for surgery.  I have reviewed the patient's chart and labs.  Questions were answered to the patient's satisfaction.    Cath Lab Visit (complete for each Cath Lab visit)  Clinical Evaluation Leading to the Procedure:   ACS: Yes.    Non-ACS:    Anginal Classification: CCS III  Anti-ischemic medical therapy: Maximal Therapy (2 or more classes of medications)  Non-Invasive Test Results: No non-invasive testing performed  Prior CABG: No previous CABG   Glenetta Hew

## 2019-01-28 NOTE — H&P (View-Only) (Signed)
Cardiology Consult    Patient ID: Daniel [name] MRN: [id number], DOB/AGE: 08/13/1961   Admit date: 01/27/2019 Date of Consult: 01/28/2019  Primary Physician: Tonia Ghent, MD. Primary Cardiologist: New Consult (Dr. Oval Linsey). Previously seen once by Dr. Yvone Neu in 2018. Requesting Provider: Damita Lack, MD.  Patient Profile    Daniel [name] is a 58 y.o. male with a history of hypertension, hyperlipidemia, obesity, and recently diagnosed prostrate cancer who is being seen today for the evaluation of chest pain at the request of Dr. Reesa Chew.  History of Present Illness    Daniel [name] is a 58 year old African-American male with a history of hypertension, hyperlipidemia, and obesity. Patient was admitted in 01/2017 for chest pain with associated shortness of breath and lightheadedness. Echocardiogram at that time showed LVEF of 55-60% with grade 1 diastolic dysfunction but no regional wall motion abnormalities. He ruled out for ACS at that time and was advised to follow-up with Cardiology as an outpatient. Patient was seen by Dr. Yvone Neu at our [facility] office soon after and recommended a pharmacologic stress test; however, it does not appear that this was ever done.   Patient presented to the [facility] ED yesterday for evaluation of shortness of breath with exertion x2 days and chest pain. Patient has noticed some increased shortness of breath with exertion over the last 2 days. Yesterday, he was walking outside to feed his dog and became significantly short of breath with associated diaphoresis. Patient states his breathing worsened as he continued to walk and by the time he got back inside he was gasping for air. He then developed non-radiating left sided chest pain that he describes as sharp that lasted for a couple of minutes and then resolved on its own. He notes associated dizziness but denies any nausea, vomiting, presyncope/syncope. Patient denies any other recent exertional  symptoms. Patient called his wife who advised him to go across the street to the fire station which he did. At the fire department, they checked his blood pressure which was elevated but his pulse ox and blood sugar was reportedly normal. Patient his shortness of breath finally started to improve while at the fire department; however, given his symptoms they had EMS transport patient to the ED. Patient recently had a genitourinary infection requiring antibiotics and was recently diagnosed with prostate cancer; however, he denies any fever or other illnesses.   Upon arrival to the ED, vitals stable. EKG showed normal sinus rhythm with new T wave inversion in inferior and lateral leads. I-stat troponin negative. BNP normal. Chest x-ray showed no acute findings. Chest CTA showed no evidence of pulmonary embolism. WBC 6.3, Hgb 12.7, Plts 338. Na 138, K 4.0, Glucose 101, SCr 1.12. Patient was admitted for further evaluation.  At the time of this evaluation, patient is completely asymptomatic. He denies any further episodes of shortness of breath or chest pain.   Patient denies any tobacco, alcohol, or drug use. His brother was recently diagnosed with CHF in his late 31's, but patient denies any other known family history of heart disease, stroke, or sudden death.   Past Medical History   Past Medical History:  Diagnosis Date  . 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 Chest pain    12/12:  Lexiscan Myoview demonstrated no ischemia or scar.  His EF was 41%.; Echocardiogram 01/16/12: LVH, EF 09-32%, diastolic function normal, no wall motion abnormalities, moderate  LAE, mild RVE   . Esophageal stricture 11/08/2013  . GERD (gastroesophageal reflux disease) 02-18-12   none in 5 months  . Heart murmur 02-18-12   as child, not now  . Hyperlipidemia   . Hypertension   . Mass of colon   . Tubular adenoma of colon 11/2011  . Villous  adenoma of right colon 03/12/2012   Laparoscopic right colectomy 02/25/2012     Past Surgical History:  Procedure Laterality Date  . APPENDECTOMY  2013  . COLON RESECTION  02/25/2012   Procedure: LAPAROSCOPIC RIGHT COLON RESECTION;  Surgeon: Adin Hector, MD;  Location: [facility] ORS;  Service: General;  Laterality: Right;  laparoscopic assisted right colectomy  . COLONOSCOPY  11/21/11  . LAPAROSCOPIC RIGHT COLON RESECTION  2013   adenomatous polyp     Allergies  Allergies  Allergen Reactions  . Oxycodone Other (See Comments)    syncope  . Lisinopril Cough    Inpatient Medications    . amLODipine  10 mg Oral Daily  . aspirin  81 mg Oral Daily  . enoxaparin (LOVENOX) injection  40 mg Subcutaneous Q24H  . insulin aspart  0-9 Units Subcutaneous TID WC  . pantoprazole  40 mg Oral Daily  . pravastatin  40 mg Oral Daily    Family History    Family History  Problem Relation Age of Onset  . Heart disease Mother 46       MI, died at age 83  . Hypertension Mother   . Coronary artery disease Mother   . Alcohol abuse Father        cirrhoisis of liver-- ETOH abuse  . Heart disease Brother   . Colon cancer Neg Hx   . Prostate cancer Neg Hx   . Esophageal cancer Neg Hx   . Rectal cancer Neg Hx   . Stomach cancer Neg Hx    He indicated that his mother is deceased. He indicated that his father is deceased. He indicated that his brother is alive. He indicated that the status of his neg hx is unknown.   Social History    Social History   Socioeconomic History  . Marital status: Married    Spouse name: Not on file  . Number of children: 2  . Years of education: Not on file  . Highest education level: Not on file  Occupational History  . Occupation: [employer]    Employer: UNEMPLOYED  Social Needs  . Financial resource strain: Not on file  . Food insecurity:    Worry: Not on file    Inability: Not on file  . Transportation needs:    Medical: Not on file    Non-medical:  Not on file  Tobacco Use  . Smoking status: Never Smoker  . Smokeless tobacco: Never Used  Substance and Sexual Activity  . Alcohol use: No  . Drug use: No  . Sexual activity: Yes  Lifestyle  . Physical activity:    Days per week: Not on file    Minutes per session: Not on file  . Stress: Not on file  Relationships  . Social connections:    Talks on phone: Not on file    Gets together: Not on file    Attends religious service: Not on file    Active member of club or organization: Not on file    Attends meetings of clubs or organizations: Not on file    Relationship status: Not on file  . Intimate partner violence:  Fear of current or ex partner: Not on file    Emotionally abused: Not on file    Physically abused: Not on file    Forced sexual activity: Not on file  Other Topics Concern  . Not on file  Social History Narrative   Worked for [employer] until 2014   Divorced 20+ years   Remarried 2017   Retired from [employer] 2019   1 son/1 daughter   1 grandchild born 2019 [place])   [employer] football, baseball, and basketball     Review of Systems    Review of Systems  Constitutional: Positive for diaphoresis and malaise/fatigue. Negative for chills and fever.  HENT: Negative for congestion and sore throat.   Respiratory: Positive for shortness of breath. Negative for cough, hemoptysis and sputum production.   Cardiovascular: Positive for chest pain. Negative for orthopnea, leg swelling and PND.  Gastrointestinal: Negative for blood in stool, nausea and vomiting.  Genitourinary: Negative for hematuria.  Musculoskeletal: Negative for myalgias.  Neurological: Positive for dizziness. Negative for loss of consciousness.  Endo/Heme/Allergies: Does not bruise/bleed easily.  Psychiatric/Behavioral: Negative for substance abuse.    Physical Exam    Blood pressure (!) 106/59, pulse 67, temperature 97.9 F (36.6 C), temperature source Oral, resp. rate 16,  height 5\' 10"  (1.778 m), weight 117.9 kg, SpO2 99 %.  General: 58 y.o. obese African-American male resting comfortably in no acute distress. Pleasant and cooperative. HEENT: Normal.  Neck: Supple. No carotid bruits or JVD appreciated. Lungs: No increased work of breathing. Clear to auscultation bilaterally. No wheezes, rhonchi, or rales. Heart: RRR. Distinct S1 and S2. No murmurs, gallops, or rubs.  Abdomen: Soft, non-distended, and non-tender to palpation. Bowel sounds present.  Extremities: No to trace edema of bilateral lower extremities. Radial and distal pedal pulses 2+ and equal bilaterally. Skin: Warm and dry.  Neuro: Alert and oriented x3. No focal deficits. Moves all extremities spontaneously. Psych: Normal affect. Responds appropriately.   Labs    Troponin [facility] Test) Recent Labs    01/27/19 1155  TROPIPOC 0.01   Recent Labs    01/27/19 1923 01/27/19 2330  TROPONINI <0.03 <0.03   Lab Results  Component Value Date   WBC 7.1 01/27/2019   HGB 13.1 01/27/2019   HCT 41.8 01/27/2019   MCV 81.0 01/27/2019   PLT 360 01/27/2019    Recent Labs  Lab 01/27/19 2330  NA 139  K 3.8  CL 102  CO2 27  BUN 12  CREATININE 1.12  CALCIUM 8.8*  PROT 7.3  BILITOT 0.6  ALKPHOS 49  ALT 24  AST 17  GLUCOSE 97   Lab Results  Component Value Date   CHOL 153 01/27/2019   HDL 32 (L) 01/27/2019   LDLCALC 105 (H) 01/27/2019   TRIG 78 01/27/2019   Lab Results  Component Value Date   DDIMER <0.27 01/16/2017     Radiology Studies    Dg Chest 2 View  Result Date: 01/27/2019 CLINICAL DATA:  58 year old male with a history of chest pain and shortness of breath EXAM: CHEST - 2 VIEW COMPARISON:  01/10/2018 FINDINGS: Cardiomediastinal silhouette unchanged in size and contour. No evidence of central vascular congestion. No pneumothorax or pleural effusion. No confluent airspace disease. No displaced fracture. IMPRESSION: No active cardiopulmonary disease. Electronically  Signed   By: Corrie Mckusick D.O.   On: 01/27/2019 13:16   Ct Angio Chest Pe W/cm &/or Wo Cm  Result Date: 01/27/2019 CLINICAL DATA:  Left chest pain of sudden onset. EXAM: CT ANGIOGRAPHY CHEST WITH CONTRAST TECHNIQUE: Multidetector CT imaging of the chest was performed using the standard protocol during bolus administration of intravenous contrast. Multiplanar CT image reconstructions and MIPs were obtained to evaluate the vascular anatomy. CONTRAST:  75 cc ISOVUE-370 IOPAMIDOL (ISOVUE-370) INJECTION 76% COMPARISON:  Chest radiograph from earlier today. FINDINGS: Cardiovascular: The study is high quality for the evaluation of pulmonary embolism. There are no filling defects in the central, lobar, segmental or subsegmental pulmonary artery branches to suggest acute pulmonary embolism. Great vessels are normal in course and caliber. Normal heart size. No significant pericardial fluid/thickening. Mediastinum/Nodes: No discrete thyroid nodules. Unremarkable esophagus. No pathologically enlarged axillary, mediastinal or hilar lymph nodes. Lungs/Pleura: No pneumothorax. No pleural effusion. No acute consolidative airspace disease or lung masses. Right middle lobe solid 3 mm pulmonary nodule (series 8/image 52). No additional significant pulmonary nodules. Upper abdomen: No acute abnormality. Musculoskeletal: No aggressive appearing focal osseous lesions. Mild thoracic spondylosis. Review of the MIP images confirms the above findings. IMPRESSION: 1. No pulmonary embolism.  No acute pulmonary disease. 2. Solid 3 mm right middle lobe pulmonary nodule. No follow-up needed if patient is low-risk. Non-contrast chest CT can be considered in 12 months if patient is high-risk. This recommendation follows the consensus statement: Guidelines for Management of Incidental Pulmonary Nodules Detected on CT Images:From the Fleischner Society 2017; published online before print (10.1148/radiol.1194174081). Electronically Signed   By:  Ilona Sorrel M.D.   On: 01/27/2019 14:19    EKG     EKG: EKG was personally reviewed and demonstrates: Normal sinus rhythm with T wave inversions noted in inferior leads and leads V5-V6. Mild T wave inversions have previously been seen in lead III but they are more prominent now. All other T wave inversions appear new.   Telemetry: Telemetry was personally reviewed and demonstrates: Sinus rhythm with heart rates in the 60's to 90's.   Cardiac Imaging    Echocardiogram 01/16/2017: Study Conclusions: - Left ventricle: The cavity size was normal. Wall thickness was   increased in a pattern of mild LVH. Systolic function was normal.   The estimated ejection fraction was in the range of 55% to 60%.   Wall motion was normal; there were no regional wall motion   abnormalities. Doppler parameters are consistent with abnormal   left ventricular relaxation (grade 1 diastolic dysfunction). - Left atrium: The atrium was mildly dilated.  Impressions: - Normal LV systolic function; mild LVH; grade 1 diastolic   dysfunction; mild LAE.  Assessment & Plan    Chest Pain and Dyspnea on Exertion - Patient presented with significant shortness of breath on exertion with associated chest pain, diaphoresis, and dizziness. Patient had similar episode in 2018 at which time outpatient stress test was recommended; however, this was never done due to financial reasons. - EKG showed new T wave inversion in inferior and lateral leads.  - Troponin negative x3.  - BNP normal. - Chest x-ray showed no acute findings. - Chest CTA showed negative for PE. - Patient denies any shortness of breath or chest pain at this time. - Question whether dyspnea could be anginal equivalent. Given multiple CV risk factors and new EKG changes, consider Lexiscan Myoview. Patient does not think he would be able to ambulate on a treadmill due to knee osteoarthritis.   Hypertension - BP currently well controlled at 106/59. - Patient  on Amlodipine 10mg  daily and Losartan 75mg  daily at home. Losartan was held on admission. -  Continue current medications.  Hyperlipidemia - Lipid panel this admission: Cholesterol 153, Triglycerides 78, HDL 32, LDL 105.  - Patient on Pravastatin 40mg  daily at home. Could consider switching to Lipitor 40mg  daily.  Pre-Diabetes - Hemoglobin A1c 6.4% this admission. - Will defer to primary team and PCP.  Signed, Darreld Mclean, PA-C 01/28/2019, 8:16 AM  For questions or updates, please contact   Please consult [email or web address] for contact info under Cardiology/STEMI.

## 2019-01-28 NOTE — Consult Note (Signed)
Cardiology Consult    Patient ID: Daniel [name] MRN: [id number], DOB/AGE: 1961-08-03   Admit date: 01/27/2019 Date of Consult: 01/28/2019  Primary Physician: Tonia Ghent, MD. Primary Cardiologist: New Consult (Dr. Oval Linsey). Previously seen once by Dr. Yvone Neu in 2018. Requesting Provider: Damita Lack, MD.  Patient Profile    Jayquan Bradsher is a 58 y.o. male with a history of hypertension, hyperlipidemia, obesity, and recently diagnosed prostrate cancer who is being seen today for the evaluation of chest pain at the request of Dr. Reesa Chew.  History of Present Illness    Mr. Shadwick is a 58 year old African-American male with a history of hypertension, hyperlipidemia, and obesity. Patient was admitted in 01/2017 for chest pain with associated shortness of breath and lightheadedness. Echocardiogram at that time showed LVEF of 55-60% with grade 1 diastolic dysfunction but no regional wall motion abnormalities. He ruled out for ACS at that time and was advised to follow-up with Cardiology as an outpatient. Patient was seen by Dr. Yvone Neu at our [facility] office soon after and recommended a pharmacologic stress test; however, it does not appear that this was ever done.   Patient presented to the [facility] ED yesterday for evaluation of shortness of breath with exertion x2 days and chest pain. Patient has noticed some increased shortness of breath with exertion over the last 2 days. Yesterday, he was walking outside to feed his dog and became significantly short of breath with associated diaphoresis. Patient states his breathing worsened as he continued to walk and by the time he got back inside he was gasping for air. He then developed non-radiating left sided chest pain that he describes as sharp that lasted for a couple of minutes and then resolved on its own. He notes associated dizziness but denies any nausea, vomiting, presyncope/syncope. Patient denies any other recent exertional  symptoms. Patient called his wife who advised him to go across the street to the fire station which he did. At the fire department, they checked his blood pressure which was elevated but his pulse ox and blood sugar was reportedly normal. Patient his shortness of breath finally started to improve while at the fire department; however, given his symptoms they had EMS transport patient to the ED. Patient recently had a genitourinary infection requiring antibiotics and was recently diagnosed with prostate cancer; however, he denies any fever or other illnesses.   Upon arrival to the ED, vitals stable. EKG showed normal sinus rhythm with new T wave inversion in inferior and lateral leads. I-stat troponin negative. BNP normal. Chest x-ray showed no acute findings. Chest CTA showed no evidence of pulmonary embolism. WBC 6.3, Hgb 12.7, Plts 338. Na 138, K 4.0, Glucose 101, SCr 1.12. Patient was admitted for further evaluation.  At the time of this evaluation, patient is completely asymptomatic. He denies any further episodes of shortness of breath or chest pain.   Patient denies any tobacco, alcohol, or drug use. His brother was recently diagnosed with CHF in his late 70's, but patient denies any other known family history of heart disease, stroke, or sudden death.   Past Medical History   Past Medical History:  Diagnosis Date  . 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 Chest pain    12/12:  Lexiscan Myoview demonstrated no ischemia or scar.  His EF was 41%.; Echocardiogram 01/16/12: LVH, EF 32-44%, diastolic function normal, no wall motion abnormalities, moderate  LAE, mild RVE   . Esophageal stricture 11/08/2013  . GERD (gastroesophageal reflux disease) 02-18-12   none in 5 months  . Heart murmur 02-18-12   as child, not now  . Hyperlipidemia   . Hypertension   . Mass of colon   . Tubular adenoma of colon 11/2011  . Villous  adenoma of right colon 03/12/2012   Laparoscopic right colectomy 02/25/2012     Past Surgical History:  Procedure Laterality Date  . APPENDECTOMY  2013  . COLON RESECTION  02/25/2012   Procedure: LAPAROSCOPIC RIGHT COLON RESECTION;  Surgeon: Adin Hector, MD;  Location: [facility] ORS;  Service: General;  Laterality: Right;  laparoscopic assisted right colectomy  . COLONOSCOPY  11/21/11  . LAPAROSCOPIC RIGHT COLON RESECTION  2013   adenomatous polyp     Allergies  Allergies  Allergen Reactions  . Oxycodone Other (See Comments)    syncope  . Lisinopril Cough    Inpatient Medications    . amLODipine  10 mg Oral Daily  . aspirin  81 mg Oral Daily  . enoxaparin (LOVENOX) injection  40 mg Subcutaneous Q24H  . insulin aspart  0-9 Units Subcutaneous TID WC  . pantoprazole  40 mg Oral Daily  . pravastatin  40 mg Oral Daily    Family History    Family History  Problem Relation Age of Onset  . Heart disease Mother 37       MI, died at age 66  . Hypertension Mother   . Coronary artery disease Mother   . Alcohol abuse Father        cirrhoisis of liver-- ETOH abuse  . Heart disease Brother   . Colon cancer Neg Hx   . Prostate cancer Neg Hx   . Esophageal cancer Neg Hx   . Rectal cancer Neg Hx   . Stomach cancer Neg Hx    He indicated that his mother is deceased. He indicated that his father is deceased. He indicated that his brother is alive. He indicated that the status of his neg hx is unknown.   Social History    Social History   Socioeconomic History  . Marital status: Married    Spouse name: Not on file  . Number of children: 2  . Years of education: Not on file  . Highest education level: Not on file  Occupational History  . Occupation: [employer]    Employer: UNEMPLOYED  Social Needs  . Financial resource strain: Not on file  . Food insecurity:    Worry: Not on file    Inability: Not on file  . Transportation needs:    Medical: Not on file    Non-medical:  Not on file  Tobacco Use  . Smoking status: Never Smoker  . Smokeless tobacco: Never Used  Substance and Sexual Activity  . Alcohol use: No  . Drug use: No  . Sexual activity: Yes  Lifestyle  . Physical activity:    Days per week: Not on file    Minutes per session: Not on file  . Stress: Not on file  Relationships  . Social connections:    Talks on phone: Not on file    Gets together: Not on file    Attends religious service: Not on file    Active member of club or organization: Not on file    Attends meetings of clubs or organizations: Not on file    Relationship status: Not on file  . Intimate partner violence:  Fear of current or ex partner: Not on file    Emotionally abused: Not on file    Physically abused: Not on file    Forced sexual activity: Not on file  Other Topics Concern  . Not on file  Social History Narrative   Worked for [employer] until 2014   Divorced 20+ years   Remarried 2017   Retired from [employer] 2019   1 son/1 daughter   1 grandchild born 2019 [place])   [employer] baseball, and basketball     Review of Systems    Review of Systems  Constitutional: Positive for diaphoresis and malaise/fatigue. Negative for chills and fever.  HENT: Negative for congestion and sore throat.   Respiratory: Positive for shortness of breath. Negative for cough, hemoptysis and sputum production.   Cardiovascular: Positive for chest pain. Negative for orthopnea, leg swelling and PND.  Gastrointestinal: Negative for blood in stool, nausea and vomiting.  Genitourinary: Negative for hematuria.  Musculoskeletal: Negative for myalgias.  Neurological: Positive for dizziness. Negative for loss of consciousness.  Endo/Heme/Allergies: Does not bruise/bleed easily.  Psychiatric/Behavioral: Negative for substance abuse.    Physical Exam    Blood pressure (!) 106/59, pulse 67, temperature 97.9 F (36.6 C), temperature source Oral, resp. rate 16,  height 5\' 10"  (1.778 m), weight 117.9 kg, SpO2 99 %.  General: 58 y.o. obese African-American male resting comfortably in no acute distress. Pleasant and cooperative. HEENT: Normal.  Neck: Supple. No carotid bruits or JVD appreciated. Lungs: No increased work of breathing. Clear to auscultation bilaterally. No wheezes, rhonchi, or rales. Heart: RRR. Distinct S1 and S2. No murmurs, gallops, or rubs.  Abdomen: Soft, non-distended, and non-tender to palpation. Bowel sounds present.  Extremities: No to trace edema of bilateral lower extremities. Radial and distal pedal pulses 2+ and equal bilaterally. Skin: Warm and dry.  Neuro: Alert and oriented x3. No focal deficits. Moves all extremities spontaneously. Psych: Normal affect. Responds appropriately.   Labs    Troponin [facility] of [facility] Test) Recent Labs    01/27/19 1155  TROPIPOC 0.01   Recent Labs    01/27/19 1923 01/27/19 2330  TROPONINI <0.03 <0.03   Lab Results  Component Value Date   WBC 7.1 01/27/2019   HGB 13.1 01/27/2019   HCT 41.8 01/27/2019   MCV 81.0 01/27/2019   PLT 360 01/27/2019    Recent Labs  Lab 01/27/19 2330  NA 139  K 3.8  CL 102  CO2 27  BUN 12  CREATININE 1.12  CALCIUM 8.8*  PROT 7.3  BILITOT 0.6  ALKPHOS 49  ALT 24  AST 17  GLUCOSE 97   Lab Results  Component Value Date   CHOL 153 01/27/2019   HDL 32 (L) 01/27/2019   LDLCALC 105 (H) 01/27/2019   TRIG 78 01/27/2019   Lab Results  Component Value Date   DDIMER <0.27 01/16/2017     Radiology Studies    Dg Chest 2 View  Result Date: 01/27/2019 CLINICAL DATA:  58 year old male with a history of chest pain and shortness of breath EXAM: CHEST - 2 VIEW COMPARISON:  01/10/2018 FINDINGS: Cardiomediastinal silhouette unchanged in size and contour. No evidence of central vascular congestion. No pneumothorax or pleural effusion. No confluent airspace disease. No displaced fracture. IMPRESSION: No active cardiopulmonary disease. Electronically  Signed   By: Corrie Mckusick D.O.   On: 01/27/2019 13:16   Ct Angio Chest Pe W/cm &/or Wo Cm  Result Date: 01/27/2019 CLINICAL DATA:  Left chest pain of sudden onset. EXAM: CT ANGIOGRAPHY CHEST WITH CONTRAST TECHNIQUE: Multidetector CT imaging of the chest was performed using the standard protocol during bolus administration of intravenous contrast. Multiplanar CT image reconstructions and MIPs were obtained to evaluate the vascular anatomy. CONTRAST:  75 cc ISOVUE-370 IOPAMIDOL (ISOVUE-370) INJECTION 76% COMPARISON:  Chest radiograph from earlier today. FINDINGS: Cardiovascular: The study is high quality for the evaluation of pulmonary embolism. There are no filling defects in the central, lobar, segmental or subsegmental pulmonary artery branches to suggest acute pulmonary embolism. Great vessels are normal in course and caliber. Normal heart size. No significant pericardial fluid/thickening. Mediastinum/Nodes: No discrete thyroid nodules. Unremarkable esophagus. No pathologically enlarged axillary, mediastinal or hilar lymph nodes. Lungs/Pleura: No pneumothorax. No pleural effusion. No acute consolidative airspace disease or lung masses. Right middle lobe solid 3 mm pulmonary nodule (series 8/image 52). No additional significant pulmonary nodules. Upper abdomen: No acute abnormality. Musculoskeletal: No aggressive appearing focal osseous lesions. Mild thoracic spondylosis. Review of the MIP images confirms the above findings. IMPRESSION: 1. No pulmonary embolism.  No acute pulmonary disease. 2. Solid 3 mm right middle lobe pulmonary nodule. No follow-up needed if patient is low-risk. Non-contrast chest CT can be considered in 12 months if patient is high-risk. This recommendation follows the consensus statement: Guidelines for Management of Incidental Pulmonary Nodules Detected on CT Images:From the Fleischner Society 2017; published online before print (10.1148/radiol.[id number]). Electronically Signed   By:  Ilona Sorrel M.D.   On: 01/27/2019 14:19    EKG     EKG: EKG was personally reviewed and demonstrates: Normal sinus rhythm with T wave inversions noted in inferior leads and leads V5-V6. Mild T wave inversions have previously been seen in lead III but they are more prominent now. All other T wave inversions appear new.   Telemetry: Telemetry was personally reviewed and demonstrates: Sinus rhythm with heart rates in the 60's to 90's.   Cardiac Imaging    Echocardiogram 01/16/2017: Study Conclusions: - Left ventricle: The cavity size was normal. Wall thickness was   increased in a pattern of mild LVH. Systolic function was normal.   The estimated ejection fraction was in the range of 55% to 60%.   Wall motion was normal; there were no regional wall motion   abnormalities. Doppler parameters are consistent with abnormal   left ventricular relaxation (grade 1 diastolic dysfunction). - Left atrium: The atrium was mildly dilated.  Impressions: - Normal LV systolic function; mild LVH; grade 1 diastolic   dysfunction; mild LAE.  Assessment & Plan    Chest Pain and Dyspnea on Exertion - Patient presented with significant shortness of breath on exertion with associated chest pain, diaphoresis, and dizziness. Patient had similar episode in 2018 at which time outpatient stress test was recommended; however, this was never done due to financial reasons. - EKG showed new T wave inversion in inferior and lateral leads.  - Troponin negative x3.  - BNP normal. - Chest x-ray showed no acute findings. - Chest CTA showed negative for PE. - Patient denies any shortness of breath or chest pain at this time. - Question whether dyspnea could be anginal equivalent. Given multiple CV risk factors and new EKG changes, consider Lexiscan Myoview. Patient does not think he would be able to ambulate on a treadmill due to knee osteoarthritis.   Hypertension - BP currently well controlled at 106/59. - Patient  on Amlodipine 10mg  daily and Losartan 75mg  daily at home. Losartan was held on admission. -  Continue current medications.  Hyperlipidemia - Lipid panel this admission: Cholesterol 153, Triglycerides 78, HDL 32, LDL 105.  - Patient on Pravastatin 40mg  daily at home. Could consider switching to Lipitor 40mg  daily.  Pre-Diabetes - Hemoglobin A1c 6.4% this admission. - Will defer to primary team and PCP.  Signed, Darreld Mclean, PA-C 01/28/2019, 8:16 AM  For questions or updates, please contact   Please consult [email or web address] for contact info under Cardiology/STEMI.

## 2019-01-28 NOTE — Progress Notes (Signed)
TR BAND REMOVAL  LOCATION:    right radial  DEFLATED PER PROTOCOL:    Yes.    TIME BAND OFF / DRESSING APPLIED:    21:30   SITE UPON ARRIVAL:    Level 0  SITE AFTER BAND REMOVAL:    Level 0  CIRCULATION SENSATION AND MOVEMENT:    Within Normal Limits   Yes.    COMMENTS:   Post TR band instructions given. Pt tolerated well. 

## 2019-01-28 NOTE — Progress Notes (Signed)
Nurse attempted piv in right hand transferred to Cath Lab via stretcher with wife at bedside. Advised to take belongings for post procedure cath patient will be transferred to another unit.

## 2019-01-29 ENCOUNTER — Encounter (HOSPITAL_COMMUNITY): Payer: Self-pay | Admitting: Cardiology

## 2019-01-29 DIAGNOSIS — I208 Other forms of angina pectoris: Secondary | ICD-10-CM | POA: Diagnosis not present

## 2019-01-29 DIAGNOSIS — I200 Unstable angina: Secondary | ICD-10-CM | POA: Diagnosis not present

## 2019-01-29 DIAGNOSIS — I10 Essential (primary) hypertension: Secondary | ICD-10-CM | POA: Diagnosis not present

## 2019-01-29 LAB — GLUCOSE, CAPILLARY
GLUCOSE-CAPILLARY: 103 mg/dL — AB (ref 70–99)
Glucose-Capillary: 87 mg/dL (ref 70–99)

## 2019-01-29 NOTE — Discharge Summary (Addendum)
Physician Discharge Summary  [name] WLN:[id number] DOB: 06/12/1961 DOA: 01/27/2019  PCP: Tonia Ghent, MD  Admit date: 01/27/2019 Discharge date: 01/29/2019  Admitted From: home Discharge disposition: home   Recommendations for Outpatient Follow-Up:   1. Consider initiation of metformin if lifestyle changes not working for HgbA1c 2. Gi follow up for non-cardiac chest pain-- had prior EGD with stretching?   Discharge Diagnosis:   Active Problems:   HYPERLIPIDEMIA   Hypertension   GERD (gastroesophageal reflux disease)   Chest pain- atypical     Discharge Condition: Improved.  Diet recommendation: Low sodium, heart healthy.  Carbohydrate-modified  Wound care: None.  Code status: Full.   History of Present Illness:   Daniel [name] is a 58 y.o. male with medical history significant of essential hypertension, hyperlipidemia, obesity with BMI greater than 35 came to the hospital with complaints of chest pain.  Patient states he was walking his dog when he started experiencing left-sided sharp chest pain along with dizziness, lightheadedness and very short of breath.  He went back in his house but did not feel any better therefore his wife took him across the street to a fire station where he was given aspirin.  Pulse ox was okay but he was sent to the ER for evaluation. Patient tells me that off-and-on he has noticed this chest pain not necessarily with exertion but the shortness of breath and dizziness was new. He was here with similar pain back in 2018 and upon discharge he was advised to get outpatient stress test which he never really got it done.  Hospital Course by Problem:   chest pain and SOB with exertion -EKG shows-T wave inversions noted in V5-V6  -seen by cardiology-- s/p LHC: . The left ventricular systolic function is normal. The left ventricular ejection fraction is 55-65% by visual estimate.  LV end diastolic pressure is normal.     SUMMARY  Angiographically minimal CAD. Normal LVEF and EDP.  Essential hypertension - Continue 10 mg of Norvasc daily  Hyperlipidemia -Continue pravastatin daily -LDL 105  GERD -Protonix daily  Elevated HgbA1c: 6.4 -trending up -lifestyle changes -diabetic coordinator consult appreciated -close outpatient follow up  incidental Lung nodule: Solid 3 mm right middle lobe pulmonary nodule. Non-contrast chest CT can be considered in 12 months if patient is high-risk.  Prostate cancer -outpatient follow up  obesity Body mass index is 37.31 kg/m.    Medical Consultants:   cards   Discharge Exam:   Vitals:   01/29/19 0711 01/29/19 1137  BP: 133/78 131/85  Pulse: 83 79  Resp: 19 16  Temp: 98.4 F (36.9 C) 97.8 F (36.6 C)  SpO2: 97% 100%   Vitals:   01/28/19 2110 01/29/19 0307 01/29/19 0711 01/29/19 1137  BP: 127/83 135/88 133/78 131/85  Pulse: 72 69 83 79  Resp: 20 17 19 16   Temp:  98.3 F (36.8 C) 98.4 F (36.9 C) 97.8 F (36.6 C)  TempSrc:  Oral Oral Oral  SpO2: 98% 100% 97% 100%  Weight:  114.5 kg    Height:        General exam: Appears calm and comfortable.  The results of significant diagnostics from this hospitalization (including imaging, microbiology, ancillary and laboratory) are listed below for reference.     Procedures and Diagnostic Studies:   Dg Chest 2 View  Result Date: 01/27/2019 CLINICAL DATA:  58 year old male with a history of chest pain and shortness of breath EXAM: CHEST - 2 VIEW  COMPARISON:  01/10/2018 FINDINGS: Cardiomediastinal silhouette unchanged in size and contour. No evidence of central vascular congestion. No pneumothorax or pleural effusion. No confluent airspace disease. No displaced fracture. IMPRESSION: No active cardiopulmonary disease. Electronically Signed   By: Corrie Mckusick D.O.   On: 01/27/2019 13:16   Ct Angio Chest Pe W/cm &/or Wo Cm  Result Date: 01/27/2019 CLINICAL DATA:  Left chest pain of  sudden onset. EXAM: CT ANGIOGRAPHY CHEST WITH CONTRAST TECHNIQUE: Multidetector CT imaging of the chest was performed using the standard protocol during bolus administration of intravenous contrast. Multiplanar CT image reconstructions and MIPs were obtained to evaluate the vascular anatomy. CONTRAST:  75 cc ISOVUE-370 IOPAMIDOL (ISOVUE-370) INJECTION 76% COMPARISON:  Chest radiograph from earlier today. FINDINGS: Cardiovascular: The study is high quality for the evaluation of pulmonary embolism. There are no filling defects in the central, lobar, segmental or subsegmental pulmonary artery branches to suggest acute pulmonary embolism. Great vessels are normal in course and caliber. Normal heart size. No significant pericardial fluid/thickening. Mediastinum/Nodes: No discrete thyroid nodules. Unremarkable esophagus. No pathologically enlarged axillary, mediastinal or hilar lymph nodes. Lungs/Pleura: No pneumothorax. No pleural effusion. No acute consolidative airspace disease or lung masses. Right middle lobe solid 3 mm pulmonary nodule (series 8/image 52). No additional significant pulmonary nodules. Upper abdomen: No acute abnormality. Musculoskeletal: No aggressive appearing focal osseous lesions. Mild thoracic spondylosis. Review of the MIP images confirms the above findings. IMPRESSION: 1. No pulmonary embolism.  No acute pulmonary disease. 2. Solid 3 mm right middle lobe pulmonary nodule. No follow-up needed if patient is low-risk. Non-contrast chest CT can be considered in 12 months if patient is high-risk. This recommendation follows the consensus statement: Guidelines for Management of Incidental Pulmonary Nodules Detected on CT Images:From the Fleischner Society 2017; published online before print (10.1148/[id number]). Electronically Signed   By: Ilona Sorrel M.D.   On: 01/27/2019 14:19     Labs:   Basic Metabolic Panel: Recent Labs  Lab 01/27/19 1139 01/27/19 1543 01/27/19 2330  NA 138   --  139  K 4.0  --  3.8  CL 103  --  102  CO2 26  --  27  GLUCOSE 101*  --  97  BUN 13  --  12  CREATININE 1.12 1.08 1.12  CALCIUM 9.0  --  8.8*   GFR Estimated Creatinine Clearance: 92.2 mL/min (by C-G formula based on SCr of 1.12 mg/dL). Liver Function Tests: Recent Labs  Lab 01/27/19 2330  AST 17  ALT 24  ALKPHOS 49  BILITOT 0.6  PROT 7.3  ALBUMIN 3.3*   No results for input(s): LIPASE, AMYLASE in the last 168 hours. No results for input(s): AMMONIA in the last 168 hours. Coagulation profile No results for input(s): INR, PROTIME in the last 168 hours.  CBC: Recent Labs  Lab 01/27/19 1139 01/27/19 1543 01/27/19 2330  WBC 6.3 6.3 7.1  HGB 12.7* 13.0 13.1  HCT 42.5 43.1 41.8  MCV 81.7 81.8 81.0  PLT 338 349 360   Cardiac Enzymes: Recent Labs  Lab 01/27/19 1923 01/27/19 2330  TROPONINI <0.03 <0.03   BNP: Invalid input(s): POCBNP CBG: Recent Labs  Lab 01/28/19 1102 01/28/19 1829 01/28/19 2154 01/29/19 0658 01/29/19 1300  GLUCAP 73 82 93 87 103*   D-Dimer No results for input(s): DDIMER in the last 72 hours. Hgb A1c Recent Labs    01/27/19 1543  HGBA1C 6.4*   Lipid Profile Recent Labs    01/27/19 1543  CHOL 153  HDL  32*  LDLCALC 105*  TRIG 78  CHOLHDL 4.8   Thyroid function studies Recent Labs    01/27/19 1543  TSH 0.955   Anemia work up No results for input(s): VITAMINB12, FOLATE, FERRITIN, TIBC, IRON, RETICCTPCT in the last 72 hours. Microbiology No results found for this or any previous visit (from the past 240 hour(s)).   Discharge Instructions:   Discharge Instructions    Diet - low sodium heart healthy   Complete by:  As directed    Diet Carb Modified   Complete by:  As directed    Discharge instructions   Complete by:  As directed    Lifestyle changes for elevated blood sugars-- may need metformin eventually but will defer to your PCP   Increase activity slowly   Complete by:  As directed      Allergies as of  01/29/2019      Reactions   Oxycodone Other (See Comments)   syncope   Lisinopril Cough      Medication List    STOP taking these medications   albuterol 108 (90 Base) MCG/ACT inhaler Commonly known as:  PROVENTIL HFA;VENTOLIN HFA   losartan 50 MG tablet Commonly known as:  COZAAR     TAKE these medications   amLODipine 10 MG tablet Commonly known as:  NORVASC TAKE 1 TABLET BY MOUTH EVERY DAY FOR BLOOD PRESSURE What changed:    how much to take  how to take this  when to take this  additional instructions   aspirin 81 MG tablet Take 81 mg by mouth daily.   pantoprazole 40 MG tablet Commonly known as:  PROTONIX Take 40 mg by mouth daily.   pravastatin 40 MG tablet Commonly known as:  PRAVACHOL Take 1 tablet (40 mg total) by mouth daily.      Follow-up Information    Tonia Ghent, MD Follow up in 1 week(s).   Specialty:  Family Medicine Contact information: [place] [id number] [phone]            Time coordinating discharge: 25 min  Signed:  Geradine Girt DO  [facility] 01/29/2019, 3:44 PM

## 2019-01-29 NOTE — Progress Notes (Signed)
Progress Note  Patient Name: Daniel [name] Date of Encounter: 01/29/2019  Primary Cardiologist: New Consult (Dr. Oval Linsey). Previously seen once by Dr. Yvone Neu in 2018.  Subjective   No significant overnight events. Patient is doing well this morning. He denies any further episodes of chest pain or shortness of breath. Some tenderness of right radial cath site with palpation but soft and no signs of hematoma.  Inpatient Medications    Scheduled Meds: . amLODipine  10 mg Oral Daily  . aspirin  81 mg Oral Daily  . enoxaparin (LOVENOX) injection  40 mg Subcutaneous Q24H  . insulin aspart  0-9 Units Subcutaneous TID WC  . pantoprazole  40 mg Oral Daily  . pravastatin  40 mg Oral Daily  . sodium chloride flush  3 mL Intravenous Q12H   Continuous Infusions: . sodium chloride     PRN Meds: sodium chloride, acetaminophen **OR** acetaminophen, bisacodyl, HYDROcodone-acetaminophen, ondansetron **OR** ondansetron (ZOFRAN) IV, senna-docusate, sodium chloride flush   Vital Signs    Vitals:   01/28/19 2000 01/28/19 2010 01/28/19 2110 01/29/19 0307  BP:  132/89 127/83 135/88  Pulse: 84 75 72 69  Resp: 20 19 20 17   Temp:    98.3 F (36.8 C)  TempSrc:    Oral  SpO2: 98% 98% 98% 100%  Weight:    114.5 kg  Height:        Intake/Output Summary (Last 24 hours) at 01/29/2019 0719 Last data filed at 01/28/2019 2200 Gross per 24 hour  Intake 540 ml  Output -  Net 540 ml   Last 3 Weights 01/29/2019 01/27/2019 01/27/2019  Weight (lbs) 252 lb 6.8 oz 260 lb 260 lb  Weight (kg) 114.5 kg 117.935 kg 117.935 kg      Telemetry    Sinus rhythm with heart rates ranging from the 60's to low 100's. - Personally Reviewed  ECG    No new ECG tracings today. - Personally Reviewed  Physical Exam   GEN: Resting comfortably. Alert and in no acute distress.   Neck: Supple. Cardiac: RRR. No murmurs, rubs, or gallops. Right radial cath tender to palpation but soft with no signs of hematoma. Radial  and distal pedal pulses 2+ and equal bilaterally. Respiratory: Clear to auscultation bilaterally. No wheezes, rhonchi, or rales. GI: Abdomen soft, non-distended, and non-tender. Bowel sounds present. MSK: No lower extremity edema. No deformities. Skin: Warm and dry. Neuro:  No focal deficits. Psych: Normal affect. Responds appropriately.  Labs    Chemistry Recent Labs  Lab 01/27/19 1139 01/27/19 1543 01/27/19 2330  NA 138  --  139  K 4.0  --  3.8  CL 103  --  102  CO2 26  --  27  GLUCOSE 101*  --  97  BUN 13  --  12  CREATININE 1.12 1.08 1.12  CALCIUM 9.0  --  8.8*  PROT  --   --  7.3  ALBUMIN  --   --  3.3*  AST  --   --  17  ALT  --   --  24  ALKPHOS  --   --  49  BILITOT  --   --  0.6  GFRNONAA >60 >60 >60  GFRAA >60 >60 >60  ANIONGAP 9  --  10     Hematology Recent Labs  Lab 01/27/19 1139 01/27/19 1543 01/27/19 2330  WBC 6.3 6.3 7.1  RBC 5.20 5.27 5.16  HGB 12.7* 13.0 13.1  HCT 42.5 43.1 41.8  MCV  81.7 81.8 81.0  MCH 24.4* 24.7* 25.4*  MCHC 29.9* 30.2 31.3  RDW 14.4 14.5 14.6  PLT 338 349 360    Cardiac Enzymes Recent Labs  Lab 01/27/19 1923 01/27/19 2330  TROPONINI <0.03 <0.03    Recent Labs  Lab 01/27/19 1155  TROPIPOC 0.01     BNP Recent Labs  Lab 01/27/19 1139  BNP 17.6     DDimer No results for input(s): DDIMER in the last 168 hours.   Radiology    Dg Chest 2 View  Result Date: 01/27/2019 CLINICAL DATA:  58 year old male with a history of chest pain and shortness of breath EXAM: CHEST - 2 VIEW COMPARISON:  01/10/2018 FINDINGS: Cardiomediastinal silhouette unchanged in size and contour. No evidence of central vascular congestion. No pneumothorax or pleural effusion. No confluent airspace disease. No displaced fracture. IMPRESSION: No active cardiopulmonary disease. Electronically Signed   By: Corrie Mckusick D.O.   On: 01/27/2019 13:16   Ct Angio Chest Pe W/cm &/or Wo Cm  Result Date: 01/27/2019 CLINICAL DATA:  Left chest pain of  sudden onset. EXAM: CT ANGIOGRAPHY CHEST WITH CONTRAST TECHNIQUE: Multidetector CT imaging of the chest was performed using the standard protocol during bolus administration of intravenous contrast. Multiplanar CT image reconstructions and MIPs were obtained to evaluate the vascular anatomy. CONTRAST:  75 cc ISOVUE-370 IOPAMIDOL (ISOVUE-370) INJECTION 76% COMPARISON:  Chest radiograph from earlier today. FINDINGS: Cardiovascular: The study is high quality for the evaluation of pulmonary embolism. There are no filling defects in the central, lobar, segmental or subsegmental pulmonary artery branches to suggest acute pulmonary embolism. Great vessels are normal in course and caliber. Normal heart size. No significant pericardial fluid/thickening. Mediastinum/Nodes: No discrete thyroid nodules. Unremarkable esophagus. No pathologically enlarged axillary, mediastinal or hilar lymph nodes. Lungs/Pleura: No pneumothorax. No pleural effusion. No acute consolidative airspace disease or lung masses. Right middle lobe solid 3 mm pulmonary nodule (series 8/image 52). No additional significant pulmonary nodules. Upper abdomen: No acute abnormality. Musculoskeletal: No aggressive appearing focal osseous lesions. Mild thoracic spondylosis. Review of the MIP images confirms the above findings. IMPRESSION: 1. No pulmonary embolism.  No acute pulmonary disease. 2. Solid 3 mm right middle lobe pulmonary nodule. No follow-up needed if patient is low-risk. Non-contrast chest CT can be considered in 12 months if patient is high-risk. This recommendation follows the consensus statement: Guidelines for Management of Incidental Pulmonary Nodules Detected on CT Images:From the Fleischner Society 2017; published online before print (10.1148/[id number]). Electronically Signed   By: Ilona Sorrel M.D.   On: 01/27/2019 14:19    Cardiac Studies   Left Heart Catheterization 01/28/2019:  The left ventricular systolic function is  normal. The left ventricular ejection fraction is 55-65% by visual estimate.  LV end diastolic pressure is normal.   Summary:  Angiographically minimal CAD.  Normal LVEF and EDP.  Recommendations:  Look for noncardiac etiology for chest pain.  Defer further treatment plan to primary service.  Would be ready for discharge from cardiac standpoint after bedrest is over tonight.   Patient Profile   Mr. Hinely is a 58 y.o. male with a history of hypertension, hyperlipidemia, obesity, and recently diagnosed prostrate cancer who is being seen today for the evaluation of chest pain.  Assessment & Plan    Chest Pain - EKG showed new T wave inversions in lateral leads.  - Troponin negative x3. - BNP normal. - Chest CTA negative for PE. - Left heart catheterization showed minimal CAD with normal LVEF and LVEDP. -  Patient denies any further episodes of chest pain or shortness of breath.  - Non-cardiac chest pain. Will defer treatment plan to primary service. Likely okay for discharge today from a cardiac standpoint. MD to see.   Hypertension - Most recent BP 133/78. - Continue home medications.  Hyperlipidemia - Lipid panel this admission: Cholesterol 153, Triglycerides 78, HDL 32, LDL 105.  - Patient on Pravastatin 40mg  daily at home. Could consider switching to Lipitor 40mg  daily.  Pre-Diabetes - Hemoglobin A1c 6.4% this admission. - Will defer to PCP.  Pulmonary Nodule - Solid 101mm pulmonary nodule in the right middle lobe. Per Fleischner guidelines: If low-risk, no need for follow-up. If high-risk, non-contrast chest CT can be considered in 12 months.  - Will defer to primary team and PCP.  For questions or updates, please contact [facility] Please consult [email or web address] for contact info under        Signed, Darreld Mclean, PA-C  01/29/2019, 7:19 AM

## 2019-01-31 ENCOUNTER — Telehealth: Payer: Self-pay | Admitting: Family Medicine

## 2019-01-31 DIAGNOSIS — R911 Solitary pulmonary nodule: Secondary | ICD-10-CM | POA: Insufficient documentation

## 2019-01-31 NOTE — Telephone Encounter (Signed)
Call pt.  We'll talk about it at follow up but he had 3 mm right middle lobe pulmonary nodule. Non-contrast chest CT can be considered in 12 months if patient is high-risk.  Normally we would not have to do anything about this since he is a non-smoker but given his previous work history it probably makes sense to get a follow-up CT done in 1 year.  I made a note in the EMR about this.  Thanks.

## 2019-02-01 ENCOUNTER — Telehealth: Payer: Self-pay

## 2019-02-01 NOTE — Telephone Encounter (Signed)
Transition Care Management Follow-up Telephone Call  Admit date: 01/27/2019 Discharge date: 01/29/2019 Dx: Unstable angina/CP   How have you been since you were released from the hospital? "I'm pretty good!"   Do you understand why you were in the hospital? yes   Do you understand the discharge instructions? yes   Where were you discharged to? Home.    Items Reviewed:  Medications reviewed: yes  Allergies reviewed: yes  Dietary changes reviewed: yes, Discussed low carb diet  Referrals reviewed: yes   Functional Questionnaire:   Activities of Daily Living (ADLs):   He states they are independent in the following: ambulation, bathing and hygiene, feeding, continence, grooming, toileting and dressing States they require assistance with the following: None.    Any transportation issues/concerns?: no   Any patient concerns? no   Confirmed importance and date/time of follow-up visits scheduled yes  Provider Appointment booked with PCP 02/04/2019  Confirmed with patient if condition begins to worsen call PCP or go to the ER.  Patient was given the office number and encouraged to call back with question or concerns.  : yes

## 2019-02-04 ENCOUNTER — Encounter: Payer: Self-pay | Admitting: Family Medicine

## 2019-02-04 ENCOUNTER — Ambulatory Visit: Payer: 59 | Admitting: Family Medicine

## 2019-02-04 VITALS — BP 136/90 | HR 85 | Temp 98.9°F | Ht 70.0 in | Wt 255.4 lb

## 2019-02-04 DIAGNOSIS — I208 Other forms of angina pectoris: Secondary | ICD-10-CM | POA: Diagnosis not present

## 2019-02-04 DIAGNOSIS — R911 Solitary pulmonary nodule: Secondary | ICD-10-CM

## 2019-02-04 DIAGNOSIS — E782 Mixed hyperlipidemia: Secondary | ICD-10-CM | POA: Diagnosis not present

## 2019-02-04 DIAGNOSIS — R739 Hyperglycemia, unspecified: Secondary | ICD-10-CM

## 2019-02-04 DIAGNOSIS — I10 Essential (primary) hypertension: Secondary | ICD-10-CM

## 2019-02-04 NOTE — Patient Instructions (Addendum)
See which sugar meter is covered and let me know.   We can set you up with GI if needed.   It would be good to recheck your A1c at a visit in about 3 months.  You don't have to fast.  Take care.  Glad to see you.

## 2019-02-04 NOTE — Progress Notes (Signed)
Admit date: 01/27/2019 Discharge date: 01/29/2019  Admitted From: home Discharge disposition: home   Recommendations for Outpatient Follow-Up:   1. Consider initiation of metformin if lifestyle changes not working for HgbA1c 2. Gi follow up for non-cardiac chest pain-- had prior EGD with stretching?  Discharge Diagnosis:   Active Problems:   HYPERLIPIDEMIA   Hypertension   GERD (gastroesophageal reflux disease)   Chest pain- atypical  Discharge Condition: Improved.  Diet recommendation: Low sodium, heart healthy.  Carbohydrate-modified  Wound care: None.  Code status: Full.  History of Present Illness:   Daniel [name] a 58 y.o.malewith medical history significant ofessential hypertension, hyperlipidemia, obesity with BMI greater than 35 came to the hospital with complaints of chest pain. Patient states he was walking his dog when he started experiencing left-sided sharp chest pain along with dizziness, lightheadedness and very short of breath. He went back in his house but did not feel any better therefore his wife took him across the street to a fire station where he was given aspirin. Pulse ox was okay but he was sent to the ER for evaluation. Patient tells me that off-and-on he has noticed this chest pain not necessarily with exertion but the shortness of breath and dizziness was new. He was here with similar pain back in 2018 and upon discharge he was advised to get outpatient stress test which he never really got it done.  Hospital Course by Problem:   chest painand SOB with exertion -EKG shows-T wave inversions noted in V5-V6  -seen by cardiology-- s/p LHC: .The left ventricular systolic function is normal. The left ventricular ejection fraction is 55-65% by visual estimate.  LV end diastolic pressure is normal.  SUMMARY  Angiographically minimal CAD. Normal LVEF and EDP.  Essential hypertension -Continue 10 mg of Norvasc  daily  Hyperlipidemia -Continue pravastatin daily -LDL 105  GERD -Protonix daily  Elevated HgbA1c: 6.4 -trending up -lifestyle changes -diabetic coordinator consult appreciated -close outpatient follow up  incidental Lung nodule:Solid 3 mm right middle lobe pulmonary nodule. Non-contrast chest CT can be considered in 12 months if patient is high-risk.  Prostate cancer -outpatient follow up  obesity Body mass index is 37.31 kg/m. ============================= Inpatient course discussed with patient.  Chest pain led to admission.  Cardiac evaluation done with catheterization noted and discussed with patient.  Fortunately without severe findings on cath.  He was continued on his blood pressure and cholesterol medication in the meantime.  He is still treated with PPI.  Hyperglycemia discussed with patient.  Pulmonary nodule also incidentally noted.  Discussed with patient.  He had 3 mm right middle lobe pulmonary nodule. Non-contrast chest CT can be considered in 12 months if patient is high-risk.  Normally we would not have to do anything about this since he is a non-smoker but given his previous work history it probably makes sense to get a follow-up CT done in 1 year.  I made a note in the EMR about this.    Discussed with patient at office visit.  He is walking 2 miles a day and working on diet to help with sugar.  He is drinking more water.  He is cutting carbs.  He cut out simple carbs.  Prev A1c d/w pt.    His swallowing is good, still on PPI now.  He has had urology follow up and stressors associated with prostate cancer diagnosis, with surgery pending.    See after visit summary.  PMH and SH reviewed  ROS: Per HPI  unless specifically indicated in ROS section   Meds, vitals, and allergies reviewed.   GEN: nad, alert and oriented HEENT: mucous membranes moist NECK: supple w/o LA CV: rrr. PULM: ctab, no inc wob ABD: soft, +bs EXT: no edema SKIN: no acute  rash

## 2019-02-07 DIAGNOSIS — R739 Hyperglycemia, unspecified: Secondary | ICD-10-CM | POA: Insufficient documentation

## 2019-02-07 NOTE — Assessment & Plan Note (Signed)
No change in meds. Continue work on diet and exercise and gradual weight loss.

## 2019-02-07 NOTE — Assessment & Plan Note (Signed)
Cath without significant findings.  Continue current medications for now.  His swallowing is good.  Still on PPI.  Would continue for now.  He agrees.   Okay for outpatient follow-up.  Continue work on diet and exercise and gradual weight loss.

## 2019-02-07 NOTE — Assessment & Plan Note (Addendum)
Continue work on diet and exercise.  We can recheck periodically.  He agrees.  He can see which sugar meter is covered and let me know.   Plan on recheck A1c at visit in 3 months.

## 2019-02-07 NOTE — Assessment & Plan Note (Signed)
He had 3 mm right middle lobe pulmonary nodule. Non-contrast chest CT can be considered in 12 months if patient is high-risk.  Normally we would not have to do anything about this since he is a non-smoker but given his previous work history it probably makes sense to get a follow-up CT done in 1 year.  I made a note in the EMR about this.    Discussed with patient at office visit.

## 2019-02-14 ENCOUNTER — Encounter: Payer: Self-pay | Admitting: Family Medicine

## 2019-02-14 DIAGNOSIS — C61 Malignant neoplasm of prostate: Secondary | ICD-10-CM | POA: Insufficient documentation

## 2019-02-18 ENCOUNTER — Other Ambulatory Visit: Payer: Self-pay | Admitting: Urology

## 2019-02-18 DIAGNOSIS — C61 Malignant neoplasm of prostate: Secondary | ICD-10-CM

## 2019-03-10 ENCOUNTER — Ambulatory Visit
Admission: RE | Admit: 2019-03-10 | Discharge: 2019-03-10 | Disposition: A | Discharge status: Home / Self Care | Payer: 59 | Source: Ambulatory Visit | Attending: Urology | Admitting: Urology

## 2019-03-10 ENCOUNTER — Other Ambulatory Visit: Payer: Self-pay

## 2019-03-10 DIAGNOSIS — C61 Malignant neoplasm of prostate: Secondary | ICD-10-CM

## 2019-03-10 MED ORDER — GADOBENATE DIMEGLUMINE 529 MG/ML IV SOLN
20.0000 mL | Freq: Once | INTRAVENOUS | Status: AC | PRN
  Administered 2019-03-10: 20 mL via INTRAVENOUS

## 2019-03-11 ENCOUNTER — Other Ambulatory Visit: Payer: Self-pay | Admitting: Urology

## 2019-03-11 DIAGNOSIS — C61 Malignant neoplasm of prostate: Secondary | ICD-10-CM

## 2019-03-15 ENCOUNTER — Other Ambulatory Visit: Payer: 59

## 2019-03-16 ENCOUNTER — Other Ambulatory Visit: Payer: Self-pay | Admitting: Urology

## 2019-03-16 ENCOUNTER — Encounter: Payer: Self-pay | Admitting: Internal Medicine

## 2019-03-26 ENCOUNTER — Other Ambulatory Visit: Payer: Self-pay

## 2019-03-26 ENCOUNTER — Encounter (HOSPITAL_COMMUNITY)
Admission: RE | Admit: 2019-03-26 | Discharge: 2019-03-26 | Disposition: A | Discharge status: Home / Self Care | Payer: 59 | Source: Ambulatory Visit | Attending: Urology | Admitting: Urology

## 2019-03-26 DIAGNOSIS — C61 Malignant neoplasm of prostate: Secondary | ICD-10-CM | POA: Diagnosis not present

## 2019-03-26 MED ORDER — TECHNETIUM TC 99M MEDRONATE IV KIT
21.1000 | PACK | Freq: Once | INTRAVENOUS | Status: AC | PRN
  Administered 2019-03-26: 10:00:00 21.1 via INTRAVENOUS

## 2019-03-31 ENCOUNTER — Other Ambulatory Visit: Payer: Self-pay | Admitting: Urology

## 2019-03-31 DIAGNOSIS — C61 Malignant neoplasm of prostate: Secondary | ICD-10-CM

## 2019-04-07 ENCOUNTER — Ambulatory Visit
Admission: RE | Admit: 2019-04-07 | Discharge: 2019-04-07 | Disposition: A | Discharge status: Home / Self Care | Payer: 59 | Source: Ambulatory Visit | Attending: Urology | Admitting: Urology

## 2019-04-07 ENCOUNTER — Other Ambulatory Visit: Payer: Self-pay

## 2019-04-07 DIAGNOSIS — C61 Malignant neoplasm of prostate: Secondary | ICD-10-CM

## 2019-04-07 MED ORDER — GADOBENATE DIMEGLUMINE 529 MG/ML IV SOLN
20.0000 mL | Freq: Once | INTRAVENOUS | Status: AC | PRN
  Administered 2019-04-07: 20 mL via INTRAVENOUS

## 2019-04-13 NOTE — Patient Instructions (Addendum)
Daniel [name]  04/13/2019   Your procedure is scheduled on: 04-22-2019   Report to [facility] Main  Entrance     Report to admitting at 9:30AM     Call this number if you have problems the morning of surgery [phone]      Remember: Do not eat food After Midnight on Tuesday 04-20-2019  . Drink plenty of clear liquids (see menu below) all day Wednesday 04-21-2019 and follow all bowel prep orders provided by your surgeon. Nothing by mouth after midnight! THE NIGHT BEFORE SURGERY PLEASE COMPLETE A FLEETS ENEMA   BRUSH YOUR TEETH MORNING OF SURGERY AND RINSE YOUR MOUTH OUT, NO CHEWING GUM CANDY OR MINTS.    CLEAR LIQUID DIET   Foods Allowed                                                                     Foods Excluded  Coffee and tea, regular and decaf                             liquids that you cannot  Plain Jell-O in any flavor                                             see through such as: Fruit ices (not with fruit pulp)                                     milk, soups, orange juice  Iced Popsicles                                    All solid food Carbonated beverages, regular and diet                                    Cranberry, grape and apple juices Sports drinks like Gatorade Lightly seasoned clear broth or consume(fat free) Sugar, honey syrup  Sample Menu Breakfast                                Lunch                                     Supper Cranberry juice                    Beef broth                            Chicken broth Jell-O  Grape juice                           Apple juice Coffee or tea                        Jell-O                                      Popsicle                                                Coffee or tea                        Coffee or tea  _____________________________________________________________________      Take these medicines the morning of surgery with A SIP  OF WATER: AMLODIPINE, PANTOPRAZOLE, PRAVASTATIN                                You may not have any metal on your body including hair pins and              piercings  Do not wear jewelry, make-up, lotions, powders or perfumes, deodorant              Men may shave face and neck.   Do not bring valuables to the hospital. [place].  Contacts, dentures or bridgework may not be worn into surgery.  Leave suitcase in the car. After surgery it may be brought to your room.     Patients discharged the day of surgery will not be allowed to drive home. IF YOU ARE HAVING SURGERY AND GOING HOME THE SAME DAY, YOU MUST HAVE AN ADULT TO DRIVE YOU HOME AND BE WITH YOU FOR 24 HOURS. YOU MAY GO HOME BY TAXI OR UBER OR ORTHERWISE, BUT AN ADULT MUST ACCOMPANY YOU HOME AND STAY WITH YOU FOR 24 HOURS.  Name and phone number of your driver:  Special Instructions: N/A              Please read over the following fact sheets you were given: _____________________________________________________________________             [facility] - Preparing for Surgery Before surgery, you can play an important role.  Because skin is not sterile, your skin needs to be as free of germs as possible.  You can reduce the number of germs on your skin by washing with CHG (chlorahexidine gluconate) soap before surgery.  CHG is an antiseptic cleaner which kills germs and bonds with the skin to continue killing germs even after washing. Please DO NOT use if you have an allergy to CHG or antibacterial soaps.  If your skin becomes reddened/irritated stop using the CHG and inform your nurse when you arrive at Short Stay. Do not shave (including legs and underarms) for at least 48 hours prior to the first CHG shower.  You may shave your face/neck. Please follow these instructions carefully:  1.  Shower with CHG Soap the night before surgery  and the  morning of Surgery.  2.  If you choose to  wash your hair, wash your hair first as usual with your  normal  shampoo.  3.  After you shampoo, rinse your hair and body thoroughly to remove the  shampoo.                           4.  Use CHG as you would any other liquid soap.  You can apply chg directly  to the skin and wash                       Gently with a scrungie or clean washcloth.  5.  Apply the CHG Soap to your body ONLY FROM THE NECK DOWN.   Do not use on face/ open                           Wound or open sores. Avoid contact with eyes, ears mouth and genitals (private parts).                       Wash face,  Genitals (private parts) with your normal soap.             6.  Wash thoroughly, paying special attention to the area where your surgery  will be performed.  7.  Thoroughly rinse your body with warm water from the neck down.  8.  DO NOT shower/wash with your normal soap after using and rinsing off  the CHG Soap.                9.  Pat yourself dry with a clean towel.            10.  Wear clean pajamas.            11.  Place clean sheets on your bed the night of your first shower and do not  sleep with pets. Day of Surgery : Do not apply any lotions/deodorants the morning of surgery.  Please wear clean clothes to the hospital/surgery center.  FAILURE TO FOLLOW THESE INSTRUCTIONS MAY RESULT IN THE CANCELLATION OF YOUR SURGERY PATIENT SIGNATURE_________________________________  NURSE SIGNATURE__________________________________  ________________________________________________________________________   Daniel [name]  An incentive spirometer is a tool that can help keep your lungs clear and active. This tool measures how well you are filling your lungs with each breath. Taking long deep breaths may help reverse or decrease the chance of developing breathing (pulmonary) problems (especially infection) following:  A long period of time when you are unable to move or be active. BEFORE THE PROCEDURE   If the  spirometer includes an indicator to show your best effort, your nurse or respiratory therapist will set it to a desired goal.  If possible, sit up straight or lean slightly forward. Try not to slouch.  Hold the incentive spirometer in an upright position. INSTRUCTIONS FOR USE  1. Sit on the edge of your bed if possible, or sit up as far as you can in bed or on a chair. 2. Hold the incentive spirometer in an upright position. 3. Breathe out normally. 4. Place the mouthpiece in your mouth and seal your lips tightly around it. 5. Breathe in slowly and as deeply as possible, raising the piston or the ball toward the top of the column. 6. Hold your breath for 3-5 seconds or for  as long as possible. Allow the piston or ball to fall to the bottom of the column. 7. Remove the mouthpiece from your mouth and breathe out normally. 8. Rest for a few seconds and repeat Steps 1 through 7 at least 10 times every 1-2 hours when you are awake. Take your time and take a few normal breaths between deep breaths. 9. The spirometer may include an indicator to show your best effort. Use the indicator as a goal to work toward during each repetition. 10. After each set of 10 deep breaths, practice coughing to be sure your lungs are clear. If you have an incision (the cut made at the time of surgery), support your incision when coughing by placing a pillow or rolled up towels firmly against it. Once you are able to get out of bed, walk around indoors and cough well. You may stop using the incentive spirometer when instructed by your caregiver.  RISKS AND COMPLICATIONS  Take your time so you do not get dizzy or light-headed.  If you are in pain, you may need to take or ask for pain medication before doing incentive spirometry. It is harder to take a deep breath if you are having pain. AFTER USE  Rest and breathe slowly and easily.  It can be helpful to keep track of a log of your progress. Your caregiver can provide  you with a simple table to help with this. If you are using the spirometer at home, follow these instructions: [place] IF:   You are having difficultly using the spirometer.  You have trouble using the spirometer as often as instructed.  Your pain medication is not giving enough relief while using the spirometer.  You develop fever of 100.5 F (38.1 C) or higher. SEEK IMMEDIATE MEDICAL CARE IF:   You cough up bloody sputum that had not been present before.  You develop fever of 102 F (38.9 C) or greater.  You develop worsening pain at or near the incision site. MAKE SURE YOU:   Understand these instructions.  Will watch your condition.  Will get help right away if you are not doing well or get worse. Document Released: 04/14/2007 Document Revised: 02/24/2012 Document Reviewed: 06/15/2007 ExitCare Patient Information 2014 ExitCare, [place].   ________________________________________________________________________  WHAT IS A BLOOD TRANSFUSION? Blood Transfusion Information  A transfusion is the replacement of blood or some of its parts. Blood is made up of multiple cells which provide different functions.  Red blood cells carry oxygen and are used for blood loss replacement.  White blood cells fight against infection.  Platelets control bleeding.  Plasma helps clot blood.  Other blood products are available for specialized needs, such as hemophilia or other clotting disorders. BEFORE THE TRANSFUSION  Who gives blood for transfusions?   Healthy volunteers who are fully evaluated to make sure their blood is safe. This is blood bank blood. Transfusion therapy is the safest it has ever been in the practice of medicine. Before blood is taken from a donor, a complete history is taken to make sure that person has no history of diseases nor engages in risky social behavior (examples are intravenous drug use or sexual activity with multiple partners). The donor's  travel history is screened to minimize risk of transmitting infections, such as malaria. The donated blood is tested for signs of infectious diseases, such as HIV and hepatitis. The blood is then tested to be sure it is compatible with you in order to minimize the  chance of a transfusion reaction. If you or a relative donates blood, this is often done in anticipation of surgery and is not appropriate for emergency situations. It takes many days to process the donated blood. RISKS AND COMPLICATIONS Although transfusion therapy is very safe and saves many lives, the main dangers of transfusion include:   Getting an infectious disease.  Developing a transfusion reaction. This is an allergic reaction to something in the blood you were given. Every precaution is taken to prevent this. The decision to have a blood transfusion has been considered carefully by your caregiver before blood is given. Blood is not given unless the benefits outweigh the risks. AFTER THE TRANSFUSION  Right after receiving a blood transfusion, you will usually feel much better and more energetic. This is especially true if your red blood cells have gotten low (anemic). The transfusion raises the level of the red blood cells which carry oxygen, and this usually causes an energy increase.  The nurse administering the transfusion will monitor you carefully for complications. HOME CARE INSTRUCTIONS  No special instructions are needed after a transfusion. You may find your energy is better. Speak with your caregiver about any limitations on activity for underlying diseases you may have. SEEK MEDICAL CARE IF:   Your condition is not improving after your transfusion.  You develop redness or irritation at the intravenous (IV) site. SEEK IMMEDIATE MEDICAL CARE IF:  Any of the following symptoms occur over the next 12 hours:  Shaking chills.  You have a temperature by mouth above 102 F (38.9 C), not controlled by  medicine.  Chest, back, or muscle pain.  People around you feel you are not acting correctly or are confused.  Shortness of breath or difficulty breathing.  Dizziness and fainting.  You get a rash or develop hives.  You have a decrease in urine output.  Your urine turns a dark color or changes to pink, red, or brown. Any of the following symptoms occur over the next 10 days:  You have a temperature by mouth above 102 F (38.9 C), not controlled by medicine.  Shortness of breath.  Weakness after normal activity.  The white part of the eye turns yellow (jaundice).  You have a decrease in the amount of urine or are urinating less often.  Your urine turns a dark color or changes to pink, red, or brown. Document Released: 11/29/2000 Document Revised: 02/24/2012 Document Reviewed: 07/18/2008 [facility] Patient Information 2014 [place], [place].  _______________________________________________________________________

## 2019-04-13 NOTE — Progress Notes (Signed)
SPOKE W/  _  Pt via phone     SCREENING SYMPTOMS OF COVID 19:   COUGH--  NO  RUNNY NOSE---  No  SORE THROAT--- No  NASAL CONGESTION---- No  SNEEZING---- No  SHORTNESS OF BREATH--- No  DIFFICULTY BREATHING--- NO  TEMP >100.0 ----- NO  UNEXPLAINED BODY ACHES------ NO  CHILLS -------- NO  HEADACHES --------- NO  LOSS OF SMELL/ TASTE -------- NO    HAVE YOU OR ANY FAMILY MEMBER TRAVELLED PAST 14 DAYS OUT OF THE   COUNTY--- NO STATE----  NO COUNTRY---- No  HAVE YOU OR ANY FAMILY MEMBER BEEN EXPOSED TO ANYONE WITH COVID 19?  denies

## 2019-04-14 ENCOUNTER — Other Ambulatory Visit: Payer: Self-pay

## 2019-04-14 ENCOUNTER — Encounter (HOSPITAL_COMMUNITY)
Admission: RE | Admit: 2019-04-14 | Discharge: 2019-04-14 | Disposition: A | Discharge status: Home / Self Care | Payer: 59 | Source: Ambulatory Visit | Attending: Urology | Admitting: Urology

## 2019-04-14 ENCOUNTER — Encounter (HOSPITAL_COMMUNITY): Payer: Self-pay

## 2019-04-14 DIAGNOSIS — Z01812 Encounter for preprocedural laboratory examination: Secondary | ICD-10-CM | POA: Diagnosis present

## 2019-04-14 HISTORY — DX: Prediabetes: R73.03

## 2019-04-14 HISTORY — DX: Other specified postprocedural states: Z98.890

## 2019-04-14 HISTORY — DX: Abnormal electrocardiogram (ECG) (EKG): R94.31

## 2019-04-14 LAB — BASIC METABOLIC PANEL
Anion gap: 7 (ref 5–15)
BUN: 15 mg/dL (ref 6–20)
CO2: 26 mmol/L (ref 22–32)
Calcium: 9 mg/dL (ref 8.9–10.3)
Chloride: 105 mmol/L (ref 98–111)
Creatinine, Ser: 1.07 mg/dL (ref 0.61–1.24)
GFR calc Af Amer: >60 mL/min (ref >60)
GFR calc non Af Amer: >60 mL/min (ref >60)
Glucose, Bld: 91 mg/dL (ref 70–99)
Potassium: 4.2 mmol/L (ref 3.5–5.1)
Sodium: 138 mmol/L (ref 135–145)

## 2019-04-14 LAB — CBC
HCT: 45.9 % (ref 39.0–52.0)
Hemoglobin: 14.1 g/dL (ref 13.0–17.0)
MCH: 25.8 pg — ABNORMAL LOW (ref 26.0–34.0)
MCHC: 30.7 g/dL (ref 30.0–36.0)
MCV: 83.9 fL (ref 80.0–100.0)
Platelets: 229 10*3/uL (ref 150–400)
RBC: 5.47 MIL/uL (ref 4.22–5.81)
RDW: 15 % (ref 11.5–15.5)
WBC: 5 10*3/uL (ref 4.0–10.5)
nRBC: 0 % (ref 0.0–0.2)

## 2019-04-14 LAB — HEMOGLOBIN A1C
Hgb A1c MFr Bld: 5.6 % (ref 4.8–5.6)
Mean Plasma Glucose: 114.02 mg/dL

## 2019-04-14 MED ORDER — MAGNESIUM CITRATE PO SOLN: 1.0000 | Freq: Once | ORAL | Status: DC

## 2019-04-14 MED ORDER — FLEET ENEMA 7-19 GM/118ML RE ENEM: 1.0000 | ENEMA | Freq: Once | RECTAL | Status: DC

## 2019-04-15 NOTE — Progress Notes (Signed)
Anesthesia Chart Review   Case:  [id number] Date/Time:  04/22/19 1115   Procedures:      XI ROBOTIC ASSISTED LAPAROSCOPIC RADICAL PROSTATECTOMY LEVEL 3 (N/A ) - ONLY NEEDS 210 MIN FOR ALL PROCEDURES     LYMPHADENECTOMY, PELVIC (Bilateral )   Anesthesia type:  General   Pre-op diagnosis:  PROSTATE CANCER   Location:  [place] 03 / [facility]   Surgeon:  [name], MD      DISCUSSION: 58 yo never smoker with h/o HLD, HTN, GERD, pre-diabetes, colon mass s/p colon resection 2013, prostate cancer scheduled for above procedure 04/22/19 with Dr. [name].   Pt presented to the ED experiencing chest pain 2/12-2/14/20.  Cardiac etiolgy ruled out, cardiac cath without significant findings.  Pt with h/o esophageal stricture advised to follow up with GI.  He reports improvement with PPI.  He has not yet followed up with GI.  Seen by PCP 02/04/19, [name].  Elevated BP at PAT visit, advised to contact PCP.  Will re-evaluate DOS.    Pt can proceed with planned procedure barring acute status change.  VS: BP (!) 161/95   Pulse 79   Temp 36.8 C (Oral)   Resp 18   Ht 5\' 11"  (1.803 m)   Wt 110.2 kg   SpO2 100%   BMI 33.89 kg/m   PROVIDERS: Tonia Ghent, MD is PCP    LABS: Labs reviewed: Acceptable for surgery. (all labs ordered are listed, but only abnormal results are displayed)  Labs Reviewed  CBC - Abnormal; Notable for the following components:      Result Value   MCH 25.8 (*)    All other components within normal limits  HEMOGLOBIN U2V  BASIC METABOLIC PANEL  TYPE AND SCREEN     IMAGES: CT Angio Chest PE 01/27/19  IMPRESSION: 1. No pulmonary embolism.  No acute pulmonary disease. 2. Solid 3 mm right middle lobe pulmonary nodule. No follow-up needed if patient is low-risk. Non-contrast chest CT can be considered in 12 months if patient is high-risk. This recommendation follows the consensus statement: Guidelines for Management of Incidental Pulmonary Nodules  Detected on CT Images:From the Fleischner Society 2017; published online before print (10.1148/radiol.[id number]).  EKG: 01/28/2019 Rate 87 bpm Sinus rhythm Abnormal T (ischemia ruled out)  CV: Cardiac Cath 01/28/19  The left ventricular systolic function is normal. The left ventricular ejection fraction is 55-65% by visual estimate.  LV end diastolic pressure is normal.   SUMMARY  Angiographically minimal CAD.  Normal LVEF and EDP.  RECOMMENDATIONS  Look for noncardiac etiology for chest pain.  Defer further treatment plan to primary service.  Would be ready for discharge from cardiac standpoint after bedrest is over tonight.  Echo 01/16/17 Study Conclusions  - Left ventricle: The cavity size was normal. Wall thickness was   increased in a pattern of mild LVH. Systolic function was normal.   The estimated ejection fraction was in the range of 55% to 60%.   Wall motion was normal; there were no regional wall motion   abnormalities. Doppler parameters are consistent with abnormal   left ventricular relaxation (grade 1 diastolic dysfunction). - Left atrium: The atrium was mildly dilated.  Impressions:  - Normal LV systolic function; mild LVH; grade 1 diastolic   dysfunction; mild LAE. Past Medical History:  Diagnosis Date  . Abnormal EKG   . AC (acromioclavicular) joint bone spurs    degenerative hypertrophic spurs in the lower thoracic and mid lumbar  spine  . Chest pain    12/12:  Lexiscan Myoview demonstrated no ischemia or scar.  His EF was 41%.; Echocardiogram 01/16/12: LVH, EF 32-95%, diastolic function normal, no wall motion abnormalities, moderate LAE, mild RVE   . Esophageal stricture 11/08/2013  . GERD (gastroesophageal reflux disease) 02-18-12   none in 5 months  . Heart murmur 02-18-12   as child, not now  . Hx of percutaneous left heart catheterization    per patient , no blockages were found and was told he was ok for another 10 to 15 years , reports  today 04-14-19 no pain in chest since cath   . Hyperlipidemia   . Hypertension   . Mass of colon   . 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 Prostate cancer ([place])   . Tubular adenoma of colon 11/2011  . Villous adenoma of right colon 03/12/2012   Laparoscopic right colectomy 02/25/2012     Past Surgical History:  Procedure Laterality Date  . APPENDECTOMY  2013  . COLON RESECTION  02/25/2012   Procedure: LAPAROSCOPIC RIGHT COLON RESECTION;  Surgeon: Adin Hector, MD;  Location: [facility];  Service: General;  Laterality: Right;  laparoscopic assisted right colectomy  . COLONOSCOPY  11/21/11  . LAPAROSCOPIC RIGHT COLON RESECTION  2013   adenomatous polyp  . LEFT HEART CATH AND CORONARY ANGIOGRAPHY N/A 01/28/2019   Procedure: LEFT HEART CATH AND CORONARY ANGIOGRAPHY;  Surgeon: Leonie Man, MD;  Location: [place] [facility];  Service: Cardiovascular;  Laterality: N/A;  . PROSTATE BIOPSY      MEDICATIONS: . amLODipine (NORVASC) 10 MG tablet  . aspirin 81 MG tablet  . Multiple Vitamins-Minerals (MULTIVITAMIN WITH MINERALS) tablet  . pantoprazole (PROTONIX) 40 MG tablet  . pravastatin (PRAVACHOL) 40 MG tablet   No current facility-administered medications for this encounter.     [facility] Pre-Surgical Testing [phone] 04/15/19 10:28 AM

## 2019-04-15 NOTE — Anesthesia Preprocedure Evaluation (Addendum)
Anesthesia Evaluation  Patient identified by MRN, date of birth, ID band Patient awake    Reviewed: Allergy & Precautions, NPO status , Patient's Chart, lab work & pertinent test results  Airway Mallampati: II  TM Distance: >3 FB Neck ROM: Full    Dental no notable dental hx.    Pulmonary neg pulmonary ROS,    Pulmonary exam normal breath sounds clear to auscultation       Cardiovascular hypertension, Pt. on medications Normal cardiovascular exam Rhythm:Regular Rate:Normal     Neuro/Psych negative neurological ROS  negative psych ROS   GI/Hepatic negative GI ROS, Neg liver ROS,   Endo/Other  negative endocrine ROS  Renal/GU negative Renal ROS  negative genitourinary   Musculoskeletal negative musculoskeletal ROS (+)   Abdominal   Peds negative pediatric ROS (+)  Hematology negative hematology ROS (+)   Anesthesia Other Findings   Reproductive/Obstetrics negative OB ROS                            Anesthesia Physical Anesthesia Plan  ASA: II  Anesthesia Plan: General   Post-op Pain Management:    Induction: Intravenous and Rapid sequence  PONV Risk Score and Plan: 3 and Ondansetron, Dexamethasone, Midazolam and Treatment may vary due to age or medical condition  Airway Management Planned: Oral ETT  Additional Equipment:   Intra-op Plan:   Post-operative Plan: Extubation in OR  Informed Consent: I have reviewed the patients History and Physical, chart, labs and discussed the procedure including the risks, benefits and alternatives for the proposed anesthesia with the patient or authorized representative who has indicated his/her understanding and acceptance.     Dental advisory given  Plan Discussed with: CRNA  Anesthesia Plan Comments: (See PAT note 04/14/2019, Konrad Felix, PA-C)       Anesthesia Quick Evaluation

## 2019-04-21 NOTE — H&P (Signed)
Office Visit Report     04/14/2019   --------------------------------------------------------------------------------   Daniel [name]  MRN: [id number]  PRIMARY CARE:  Daniel Noa, MD  DOB: 09-02-1961, 58 year old Male  REFERRING:  Daniel Noa, MD  SSN: [id number]  PROVIDER:  Kathie Rhodes, M.D.    TREATING:  Daniel [name], M.D.    LOCATION:  [facility] [phone]   --------------------------------------------------------------------------------   CC/HPI: CC: Prostate Cancer   Physician requesting consult: Dr. Kathie Rhodes  PCP: Dr. Elsie [name]   Daniel [name] is a 58 year old gentleman who was found to have an elevated PSA of 6.48 prompting a TRUS biopsy of the prostate by [name] on 01/10/19. This demonstrated Gleason 4+3=7 (prognostic grade group 3) adenocarcinoma with 2 out of 12 biopsy cores positive for malignancy. Since his visit with me, he underwent an MRI of the prostate on 03/10/19 that showed a 1.7 cm right lateral mid/apex PI-RADS 5 lesion. This correlated to his DRE findings and pathology report. However, he was also noted to have a large 3.6 cm lesion encompassing the majority of the left side of the prostate. After further discussing with radiology and considering his history of acute prostatitis after his biopsy, it was felt this was likely inflammatory and not likely to represent malignancy. Bone scan imaging was performed on 03/26/19 and demonstrated some uptake at the L3 vertebral body and the lower right ribs. Additional imaging was therefore performed. An MRI of the lumbar spine indicated no evidence of metastatic disease. He did have degenerative changes and some neuroforaminal stenosis consistent with some of his neurological symptoms with radiation of pain down his left leg.   Family history: None.   Imaging studies: See above.   PMH: He has a history of hypertension, obesity (261 lbs), GERD, and hyperlipidemia. He recently developed  shortness of breath in mid February and he underwent a cardiac catheterization that demonstrated no significant coronary artery disease.  PSH: He has undergone a prior appendectomy. Although his appendix had not ruptured, he has a large midline incision.   TNM stage: cT2 Nx Mx (R apex)  PSA: 6.48  Gleason score: 4+3=7  Biopsy (01/10/19): 2/12 cores positive  Left: Benign  Right: R lateral apex (60%, 3+4=7), R lateral mid (50, 4+3=7)  Prostate volume: 59.6 cc   Nomogram  OC disease: 46%  EPE: 51%  SVI: 4%  LNI: 5%  PFS (5 year, 10 year): 73%, 58%   Urinary function: IPSS is 19. However, this has been mostly exacerbated since his biopsy when he did develop an E coli infection. He states that the symptoms persist but were not present prior to his biopsy. He has now completed his antibiotic course.  Erectile function: SHIM score is 24. He does not require medication.     ALLERGIES: lisinopril oxycodone    MEDICATIONS: Amlodipine Besylate 10 mg tablet  Aspirin Ec 81 mg tablet, delayed release  Pantoprazole Sodium 40 mg tablet, delayed release  Pravastatin Sodium 40 mg tablet     GU PSH: Prostate Needle Biopsy - 01/04/2019    NON-GU PSH: Appendectomy Surgical Pathology, Gross And Microscopic Examination For Prostate Needle - 01/04/2019    GU PMH: Stress Incontinence - 02/23/2019, - 02/11/2019 Urinary Frequency - 02/23/2019 Acute Cystitis/UTI - 02/09/2019 Prostate Cancer, He he was given a copy of his pathology report, information on the options for treatment and he is going review this and then return with his wife to make a final  decision as to how he wants to proceed. - 01/11/2019    NON-GU PMH: Muscle weakness (generalized) - 02/23/2019, - 02/11/2019 Other muscle spasm - 02/23/2019, - 02/11/2019 Arthritis Hypercholesterolemia Hypertension    FAMILY HISTORY: 1 Daughter - Daughter 1 son - Son High Cholesterol - Mother, Brother Hypertension - Brother, Mother   SOCIAL HISTORY:  Marital Status: Married Preferred Language: English; Ethnicity: Not Hispanic Or Latino; Race: Black or African American Current Smoking Status: Patient has never smoked.   Tobacco Use Assessment Completed: Used Tobacco in last 30 days? Has never drank.  Drinks 1 caffeinated drink per day.    REVIEW OF SYSTEMS:    GU Review Male:   Patient denies frequent urination, hard to postpone urination, burning/ pain with urination, get up at night to urinate, leakage of urine, stream starts and stops, trouble starting your streams, and have to strain to urinate .  Gastrointestinal (Lower):   Patient denies diarrhea and constipation.  Gastrointestinal (Upper):   Patient denies nausea and vomiting.  Constitutional:   Patient denies fever, night sweats, weight loss, and fatigue.  Skin:   Patient denies skin rash/ lesion and itching.  Eyes:   Patient denies blurred vision and double vision.  Ears/ Nose/ Throat:   Patient denies sore throat and sinus problems.  Hematologic/Lymphatic:   Patient denies swollen glands and easy bruising.  Cardiovascular:   Patient denies leg swelling and chest pains.  Respiratory:   Patient denies cough and shortness of breath.  Endocrine:   Patient denies excessive thirst.  Musculoskeletal:   Patient denies back pain and joint pain.  Neurological:   Patient denies headaches and dizziness.  Psychologic:   Patient denies depression and anxiety.   VITAL SIGNS:      04/14/2019 11:59 AM  Weight 245 lb / 111.13 kg  Height 71 in / 180.34 cm  BP 161/90 mmHg  Pulse 79 /min  Temperature 97.5 F / 36.3 C  BMI 34.2 kg/m   MULTI-SYSTEM PHYSICAL EXAMINATION:    Constitutional: Well-nourished. No physical deformities. Normally developed. Good grooming.  Neck: Neck symmetrical, not swollen. Normal tracheal position.  Respiratory: No labored breathing, no use of accessory muscles. Clear bilaterally.  Cardiovascular: Normal temperature, normal extremity pulses, no swelling, no  varicosities. Regular rate and rhythm.  Lymphatic: No enlargement of neck, axillae, groin.  Skin: No paleness, no jaundice, no cyanosis. No lesion, no ulcer, no rash.  Neurologic / Psychiatric: Oriented to time, oriented to place, oriented to person. No depression, no anxiety, no agitation.  Gastrointestinal: Well-healed small upper midline incision.  Eyes: Normal conjunctivae. Normal eyelids.  Ears, Nose, Mouth, and Throat: Left ear no scars, no lesions, no masses. Right ear no scars, no lesions, no masses. Nose no scars, no lesions, no masses. Normal hearing. Normal lips.  Musculoskeletal: Normal gait and station of head and neck.     PAST DATA REVIEWED:  Source Of History:  Patient  Lab Test Review:   PSA  Urine Test Review:   Urinalysis   10/21/18 11/15/16  PSA  Total PSA 6.48 ng/dl 2.8 ng/dl    PROCEDURES:          Urinalysis Dipstick Dipstick Cont'd  Color: Yellow Bilirubin: Neg mg/dL  Appearance: Clear Ketones: Neg mg/dL  Specific Gravity: 1.020 Blood: Neg ery/uL  pH: 7.0 Protein: Neg mg/dL  Glucose: Neg mg/dL Urobilinogen: 0.2 mg/dL    Nitrites: Neg    Leukocyte Esterase: Neg leu/uL    ASSESSMENT:      ICD-10 Details  1 GU:   Prostate Cancer - C61    PLAN:           Schedule Return Visit/Planned Activity: Keep Scheduled Appointment          Document Letter(s):  Created for Patient: Clinical Summary         Notes:   1. Prostate cancer: Fortunately, his lower urinary tract symptoms has significantly improved with appropriate antibiotic therapy. Urinalysis is clear they indicating resolution of his prostatitis. He is prepared to proceed with surgical treatment as we had discussed at his last visit. We reviewed his imaging studies. His MRI of the prostate did indicate some concern on both the right and left side of the prostate. Ultimately, was felt that the larger left-sided lesion that did not correlate to his pathologic findings is likely related to the  inflammatory process related to prostatitis. As such, we are more concerned about the right-sided plan to proceed with a left unilateral nerve-sparing procedure. His bone scan also indicated uptake at the L3 vertebrae. We reviewed his bone scan and his subsequent MRI of the spine which did not indicate evidence of metastatic disease. We did review the findings of his MRI that correlate to his neurological symptoms and I did recommend that he proceed with additional evaluation after his upcoming prostate surgery.   We have reviewed our plan for next week including the expected recovery process. We have reviewed the risks of proceed with radical prostatectomy and discuss the issues related to cancer control, urinary control, and erectile function. He expresses understanding and gives informed consent to proceed. He will proceed with a unilateral left nerve-sparing robot assisted laparoscopic radical prostatectomy and bilateral pelvic lymphadenectomy. He does have an upper midline incision from a prior ruptured appendix requiring appendectomy.   Cc: Dr. Elsie [name]        Next Appointment:      Next Appointment: 04/22/2019 11:30 AM    Appointment Type: Surgery     Location: [facility] [phone]    Provider: Raynelle [name], M.D.    Reason for Visit: WL/EXT REC RA LAP RAD PROSTATECTOMY LEVEL3 , BPLA WITH AMANDA      E & M CODE: I spent at least 25 minutes face to face with the patient, more than 50% of that time was spent on counseling and/or coordinating care.     * Signed by Daniel [name], M.D. on 04/14/19 at 4:47 PM (EDT)*

## 2019-04-22 ENCOUNTER — Other Ambulatory Visit: Payer: Self-pay

## 2019-04-22 ENCOUNTER — Ambulatory Visit (HOSPITAL_COMMUNITY): Payer: 59 | Admitting: Physician Assistant

## 2019-04-22 ENCOUNTER — Observation Stay (HOSPITAL_COMMUNITY)
Admission: RE | Admit: 2019-04-22 | Discharge: 2019-04-23 | Disposition: A | Discharge status: Home / Self Care | Payer: 59 | Attending: Urology | Admitting: Urology

## 2019-04-22 ENCOUNTER — Encounter (HOSPITAL_COMMUNITY): Payer: Self-pay

## 2019-04-22 ENCOUNTER — Encounter (HOSPITAL_COMMUNITY)
Admission: RE | Disposition: A | Discharge status: Home / Self Care | Payer: Self-pay | Source: Home / Self Care | Attending: Urology

## 2019-04-22 DIAGNOSIS — Z79899 Other long term (current) drug therapy: Secondary | ICD-10-CM | POA: Diagnosis not present

## 2019-04-22 DIAGNOSIS — E7800 Pure hypercholesterolemia, unspecified: Secondary | ICD-10-CM | POA: Insufficient documentation

## 2019-04-22 DIAGNOSIS — C61 Malignant neoplasm of prostate: Principal | ICD-10-CM | POA: Insufficient documentation

## 2019-04-22 DIAGNOSIS — E785 Hyperlipidemia, unspecified: Secondary | ICD-10-CM | POA: Insufficient documentation

## 2019-04-22 DIAGNOSIS — Z6833 Body mass index (BMI) 33.0-33.9, adult: Secondary | ICD-10-CM | POA: Diagnosis not present

## 2019-04-22 DIAGNOSIS — E669 Obesity, unspecified: Secondary | ICD-10-CM | POA: Diagnosis not present

## 2019-04-22 DIAGNOSIS — K219 Gastro-esophageal reflux disease without esophagitis: Secondary | ICD-10-CM | POA: Insufficient documentation

## 2019-04-22 DIAGNOSIS — I10 Essential (primary) hypertension: Secondary | ICD-10-CM | POA: Diagnosis not present

## 2019-04-22 HISTORY — PX: ROBOT ASSISTED LAPAROSCOPIC RADICAL PROSTATECTOMY: SHX5141

## 2019-04-22 HISTORY — PX: LYMPHADENECTOMY: SHX5960

## 2019-04-22 LAB — TYPE AND SCREEN
ABO/RH(D): O POS
Antibody Screen: NEGATIVE

## 2019-04-22 LAB — HEMOGLOBIN AND HEMATOCRIT, BLOOD
HCT: 40.6 % (ref 39.0–52.0)
Hemoglobin: 12.4 g/dL — ABNORMAL LOW (ref 13.0–17.0)

## 2019-04-22 SURGERY — XI ROBOTIC ASSISTED LAPAROSCOPIC RADICAL PROSTATECTOMY LEVEL 3
Anesthesia: General

## 2019-04-22 MED ORDER — ONDANSETRON HCL 4 MG/2ML IJ SOLN
4.0000 mg | INTRAMUSCULAR | Status: DC | PRN
  Administered 2019-04-22: 17:00:00 4 mg via INTRAVENOUS
  Filled 2019-04-22: qty 2

## 2019-04-22 MED ORDER — FENTANYL CITRATE (PF) 100 MCG/2ML IJ SOLN
INTRAMUSCULAR | Status: DC | PRN
  Administered 2019-04-22 (×2): 100 ug via INTRAVENOUS
  Administered 2019-04-22 (×3): 50 ug via INTRAVENOUS

## 2019-04-22 MED ORDER — PRAVASTATIN SODIUM 40 MG PO TABS
40.0000 mg | ORAL_TABLET | Freq: Every day | ORAL | Status: DC
  Administered 2019-04-23: 08:00:00 40 mg via ORAL
  Filled 2019-04-22: qty 1

## 2019-04-22 MED ORDER — PHENYLEPHRINE 40 MCG/ML (10ML) SYRINGE FOR IV PUSH (FOR BLOOD PRESSURE SUPPORT)
PREFILLED_SYRINGE | INTRAVENOUS | Status: AC
  Filled 2019-04-22: qty 10

## 2019-04-22 MED ORDER — DIPHENHYDRAMINE HCL 50 MG/ML IJ SOLN: 12.5000 mg | Freq: Four times a day (QID) | INTRAMUSCULAR | Status: DC | PRN

## 2019-04-22 MED ORDER — TRAMADOL HCL 50 MG PO TABS: 50.0000 mg | ORAL_TABLET | Freq: Four times a day (QID) | ORAL | 0 refills | Status: DC | PRN

## 2019-04-22 MED ORDER — MIDAZOLAM HCL 2 MG/2ML IJ SOLN
INTRAMUSCULAR | Status: AC
  Filled 2019-04-22: qty 2

## 2019-04-22 MED ORDER — LACTATED RINGERS IV SOLN
INTRAVENOUS | Status: DC
  Administered 2019-04-22 (×3): via INTRAVENOUS

## 2019-04-22 MED ORDER — SUGAMMADEX SODIUM 500 MG/5ML IV SOLN
INTRAVENOUS | Status: AC
  Filled 2019-04-22: qty 5

## 2019-04-22 MED ORDER — FENTANYL CITRATE (PF) 250 MCG/5ML IJ SOLN
INTRAMUSCULAR | Status: AC
  Filled 2019-04-22: qty 5

## 2019-04-22 MED ORDER — FENTANYL CITRATE (PF) 100 MCG/2ML IJ SOLN
INTRAMUSCULAR | Status: AC
  Filled 2019-04-22: qty 2

## 2019-04-22 MED ORDER — MORPHINE SULFATE (PF) 2 MG/ML IV SOLN
2.0000 mg | INTRAVENOUS | Status: DC | PRN
  Administered 2019-04-22 (×2): 2 mg via INTRAVENOUS
  Filled 2019-04-22 (×2): qty 1

## 2019-04-22 MED ORDER — LIDOCAINE 2% (20 MG/ML) 5 ML SYRINGE
INTRAMUSCULAR | Status: DC | PRN
  Administered 2019-04-22: 60 mg via INTRAVENOUS

## 2019-04-22 MED ORDER — BACITRACIN-NEOMYCIN-POLYMYXIN 400-5-5000 EX OINT: 1.0000 "application " | TOPICAL_OINTMENT | Freq: Three times a day (TID) | CUTANEOUS | Status: DC | PRN

## 2019-04-22 MED ORDER — FENTANYL CITRATE (PF) 100 MCG/2ML IJ SOLN
25.0000 ug | INTRAMUSCULAR | Status: DC | PRN
  Administered 2019-04-22: 15:00:00 25 ug via INTRAVENOUS
  Administered 2019-04-22 (×2): 50 ug via INTRAVENOUS
  Administered 2019-04-22: 15:00:00 25 ug via INTRAVENOUS

## 2019-04-22 MED ORDER — PROPOFOL 10 MG/ML IV BOLUS
INTRAVENOUS | Status: DC | PRN
  Administered 2019-04-22: 200 mg via INTRAVENOUS

## 2019-04-22 MED ORDER — BUPIVACAINE-EPINEPHRINE (PF) 0.25% -1:200000 IJ SOLN
INTRAMUSCULAR | Status: AC
  Filled 2019-04-22: qty 30

## 2019-04-22 MED ORDER — PHENYLEPHRINE HCL (PRESSORS) 10 MG/ML IV SOLN
INTRAVENOUS | Status: AC
  Filled 2019-04-22: qty 1

## 2019-04-22 MED ORDER — METOCLOPRAMIDE HCL 5 MG/ML IJ SOLN: 10.0000 mg | Freq: Once | INTRAMUSCULAR | Status: DC | PRN

## 2019-04-22 MED ORDER — PHENYLEPHRINE 40 MCG/ML (10ML) SYRINGE FOR IV PUSH (FOR BLOOD PRESSURE SUPPORT)
PREFILLED_SYRINGE | INTRAVENOUS | Status: DC | PRN
  Administered 2019-04-22 (×7): 80 ug via INTRAVENOUS

## 2019-04-22 MED ORDER — BELLADONNA ALKALOIDS-OPIUM 16.2-60 MG RE SUPP
1.0000 | Freq: Four times a day (QID) | RECTAL | Status: DC | PRN
  Administered 2019-04-22: 17:00:00 1 via RECTAL
  Filled 2019-04-22: qty 1

## 2019-04-22 MED ORDER — EPHEDRINE 5 MG/ML INJ
INTRAVENOUS | Status: AC
  Filled 2019-04-22: qty 10

## 2019-04-22 MED ORDER — CEFAZOLIN SODIUM-DEXTROSE 1-4 GM/50ML-% IV SOLN
1.0000 g | Freq: Three times a day (TID) | INTRAVENOUS | Status: AC
  Administered 2019-04-22 – 2019-04-23 (×2): 1 g via INTRAVENOUS
  Filled 2019-04-22 (×2): qty 50

## 2019-04-22 MED ORDER — KCL IN DEXTROSE-NACL 20-5-0.45 MEQ/L-%-% IV SOLN
INTRAVENOUS | Status: DC
  Administered 2019-04-22 – 2019-04-23 (×2): via INTRAVENOUS
  Filled 2019-04-22 (×3): qty 1000

## 2019-04-22 MED ORDER — SODIUM CHLORIDE 0.9 % IV SOLN
INTRAVENOUS | Status: DC | PRN
  Administered 2019-04-22: 40 ug/min via INTRAVENOUS

## 2019-04-22 MED ORDER — DIPHENHYDRAMINE HCL 12.5 MG/5ML PO ELIX: 12.5000 mg | ORAL_SOLUTION | Freq: Four times a day (QID) | ORAL | Status: DC | PRN

## 2019-04-22 MED ORDER — PROPOFOL 10 MG/ML IV BOLUS
INTRAVENOUS | Status: AC
  Filled 2019-04-22: qty 20

## 2019-04-22 MED ORDER — GLYCOPYRROLATE PF 0.2 MG/ML IJ SOSY
PREFILLED_SYRINGE | INTRAMUSCULAR | Status: AC
  Filled 2019-04-22: qty 1

## 2019-04-22 MED ORDER — GLYCOPYRROLATE 0.2 MG/ML IJ SOLN
INTRAMUSCULAR | Status: DC | PRN
  Administered 2019-04-22: 0.2 mg via INTRAVENOUS

## 2019-04-22 MED ORDER — BUPIVACAINE-EPINEPHRINE 0.25% -1:200000 IJ SOLN
INTRAMUSCULAR | Status: DC | PRN
  Administered 2019-04-22: 30 mL

## 2019-04-22 MED ORDER — AMLODIPINE BESYLATE 10 MG PO TABS
10.0000 mg | ORAL_TABLET | Freq: Every day | ORAL | Status: DC
  Administered 2019-04-23: 10 mg via ORAL
  Filled 2019-04-22: qty 1

## 2019-04-22 MED ORDER — DOCUSATE SODIUM 100 MG PO CAPS
100.0000 mg | ORAL_CAPSULE | Freq: Two times a day (BID) | ORAL | Status: DC
  Administered 2019-04-22 – 2019-04-23 (×2): 100 mg via ORAL
  Filled 2019-04-22 (×2): qty 1

## 2019-04-22 MED ORDER — KETOROLAC TROMETHAMINE 15 MG/ML IJ SOLN
15.0000 mg | Freq: Four times a day (QID) | INTRAMUSCULAR | Status: DC
  Administered 2019-04-22 – 2019-04-23 (×4): 15 mg via INTRAVENOUS
  Filled 2019-04-22 (×4): qty 1

## 2019-04-22 MED ORDER — MIDAZOLAM HCL 5 MG/5ML IJ SOLN
INTRAMUSCULAR | Status: DC | PRN
  Administered 2019-04-22: 2 mg via INTRAVENOUS

## 2019-04-22 MED ORDER — ACETAMINOPHEN 325 MG PO TABS: 650.0000 mg | ORAL_TABLET | ORAL | Status: DC | PRN

## 2019-04-22 MED ORDER — PANTOPRAZOLE SODIUM 40 MG PO TBEC
40.0000 mg | DELAYED_RELEASE_TABLET | Freq: Every day | ORAL | Status: DC
  Administered 2019-04-23: 08:00:00 40 mg via ORAL
  Filled 2019-04-22: qty 1

## 2019-04-22 MED ORDER — MEPERIDINE HCL 50 MG/ML IJ SOLN: 6.2500 mg | INTRAMUSCULAR | Status: DC | PRN

## 2019-04-22 MED ORDER — SUGAMMADEX SODIUM 200 MG/2ML IV SOLN
INTRAVENOUS | Status: DC | PRN
  Administered 2019-04-22: 300 mg via INTRAVENOUS

## 2019-04-22 MED ORDER — SODIUM CHLORIDE 0.9 % IV BOLUS
1000.0000 mL | Freq: Once | INTRAVENOUS | Status: AC
  Administered 2019-04-22: 15:00:00 1000 mL via INTRAVENOUS

## 2019-04-22 MED ORDER — DEXAMETHASONE SODIUM PHOSPHATE 4 MG/ML IJ SOLN
INTRAMUSCULAR | Status: DC | PRN
  Administered 2019-04-22: 10 mg via INTRAVENOUS

## 2019-04-22 MED ORDER — SODIUM CHLORIDE 0.9 % IR SOLN
Status: DC | PRN
  Administered 2019-04-22: 1000 mL

## 2019-04-22 MED ORDER — ROCURONIUM BROMIDE 10 MG/ML (PF) SYRINGE
PREFILLED_SYRINGE | INTRAVENOUS | Status: DC | PRN
  Administered 2019-04-22: 100 mg via INTRAVENOUS

## 2019-04-22 MED ORDER — ZOLPIDEM TARTRATE 5 MG PO TABS: 5.0000 mg | ORAL_TABLET | Freq: Every evening | ORAL | Status: DC | PRN

## 2019-04-22 MED ORDER — EPHEDRINE SULFATE-NACL 50-0.9 MG/10ML-% IV SOSY
PREFILLED_SYRINGE | INTRAVENOUS | Status: DC | PRN
  Administered 2019-04-22 (×4): 5 mg via INTRAVENOUS

## 2019-04-22 MED ORDER — HEPARIN SODIUM (PORCINE) 1000 UNIT/ML IJ SOLN
INTRAMUSCULAR | Status: AC
  Filled 2019-04-22: qty 1

## 2019-04-22 MED ORDER — SULFAMETHOXAZOLE-TRIMETHOPRIM 800-160 MG PO TABS: 1.0000 | ORAL_TABLET | Freq: Two times a day (BID) | ORAL | 0 refills | Status: DC

## 2019-04-22 MED ORDER — LACTATED RINGERS IV SOLN: INTRAVENOUS | Status: DC

## 2019-04-22 MED ORDER — LACTATED RINGERS IV SOLN
INTRAVENOUS | Status: DC | PRN
  Administered 2019-04-22: 13:00:00

## 2019-04-22 MED ORDER — CEFAZOLIN SODIUM-DEXTROSE 2-4 GM/100ML-% IV SOLN
2.0000 g | Freq: Once | INTRAVENOUS | Status: AC
  Administered 2019-04-22: 2 g via INTRAVENOUS
  Filled 2019-04-22: qty 100

## 2019-04-22 SURGICAL SUPPLY — 59 items
ADH SKN CLS APL DERMABOND .7 (GAUZE/BANDAGES/DRESSINGS) ×2
APL PRP STRL LF DISP 70% ISPRP (MISCELLANEOUS) ×2
APL SWBSTK 6 STRL LF DISP (MISCELLANEOUS) ×2
APPLICATOR COTTON TIP 6 STRL (MISCELLANEOUS) ×2 IMPLANT
APPLICATOR COTTON TIP 6IN STRL (MISCELLANEOUS) ×3
CATH FOLEY 2WAY SLVR 18FR 30CC (CATHETERS) ×4 IMPLANT
CATH ROBINSON RED A/P 16FR (CATHETERS) ×3 IMPLANT
CATH ROBINSON RED A/P 8FR (CATHETERS) ×3 IMPLANT
CATH TIEMANN FOLEY 18FR 5CC (CATHETERS) ×3 IMPLANT
CHLORAPREP W/TINT 26 (MISCELLANEOUS) ×3 IMPLANT
CLIP VESOLOCK LG 6/CT PURPLE (CLIP) ×6 IMPLANT
COVER SURGICAL LIGHT HANDLE (MISCELLANEOUS) ×3 IMPLANT
COVER TIP SHEARS 8 DVNC (MISCELLANEOUS) ×2 IMPLANT
COVER TIP SHEARS 8MM DA VINCI (MISCELLANEOUS) ×1
COVER WAND RF STERILE (DRAPES) IMPLANT
CUTTER ECHEON FLEX ENDO 45 340 (ENDOMECHANICALS) ×3 IMPLANT
DECANTER SPIKE VIAL GLASS SM (MISCELLANEOUS) ×3 IMPLANT
DERMABOND ADVANCED (GAUZE/BANDAGES/DRESSINGS) ×1
DERMABOND ADVANCED .7 DNX12 (GAUZE/BANDAGES/DRESSINGS) IMPLANT
DRAPE ARM DVNC X/XI (DISPOSABLE) ×8 IMPLANT
DRAPE COLUMN DVNC XI (DISPOSABLE) ×2 IMPLANT
DRAPE DA VINCI XI ARM (DISPOSABLE) ×4
DRAPE DA VINCI XI COLUMN (DISPOSABLE) ×1
DRAPE SURG IRRIG POUCH 19X23 (DRAPES) ×3 IMPLANT
DRSG TEGADERM 4X4.75 (GAUZE/BANDAGES/DRESSINGS) ×3 IMPLANT
ELECT PENCIL ROCKER SW 15FT (MISCELLANEOUS) IMPLANT
ELECT REM PT RETURN 15FT ADLT (MISCELLANEOUS) ×3 IMPLANT
GLOVE BIO SURGEON STRL SZ 6.5 (GLOVE) ×3 IMPLANT
GLOVE BIOGEL M STRL SZ7.5 (GLOVE) ×6 IMPLANT
GOWN STRL REUS W/TWL LRG LVL3 (GOWN DISPOSABLE) ×9 IMPLANT
HOLDER FOLEY CATH W/STRAP (MISCELLANEOUS) ×3 IMPLANT
IRRIG SUCT STRYKERFLOW 2 WTIP (MISCELLANEOUS) ×3
IRRIGATION SUCT STRKRFLW 2 WTP (MISCELLANEOUS) ×2 IMPLANT
IV LACTATED RINGERS 1000ML (IV SOLUTION) ×3 IMPLANT
KIT TURNOVER KIT A (KITS) IMPLANT
NDL SAFETY ECLIPSE 18X1.5 (NEEDLE) ×2 IMPLANT
NEEDLE HYPO 18GX1.5 SHARP (NEEDLE) ×3
PACK ROBOT UROLOGY CUSTOM (CUSTOM PROCEDURE TRAY) ×3 IMPLANT
RELOAD STAPLE 45 4.1 GRN THCK (STAPLE) ×2 IMPLANT
SEAL CANN UNIV 5-8 DVNC XI (MISCELLANEOUS) ×8 IMPLANT
SEAL XI 5MM-8MM UNIVERSAL (MISCELLANEOUS) ×4
SOLUTION ELECTROLUBE (MISCELLANEOUS) ×3 IMPLANT
STAPLE RELOAD 45 GRN (STAPLE) ×2 IMPLANT
STAPLE RELOAD 45MM GREEN (STAPLE) ×3
SUT ETHILON 3 0 PS 1 (SUTURE) ×3 IMPLANT
SUT MNCRL 3 0 RB1 (SUTURE) ×2 IMPLANT
SUT MNCRL 3 0 VIOLET RB1 (SUTURE) ×2 IMPLANT
SUT MNCRL AB 4-0 PS2 18 (SUTURE) ×6 IMPLANT
SUT MONOCRYL 3 0 RB1 (SUTURE) ×2
SUT VIC AB 0 CT1 27 (SUTURE) ×3
SUT VIC AB 0 CT1 27XBRD ANTBC (SUTURE) ×2 IMPLANT
SUT VIC AB 0 UR5 27 (SUTURE) ×3 IMPLANT
SUT VIC AB 2-0 SH 27 (SUTURE) ×3
SUT VIC AB 2-0 SH 27X BRD (SUTURE) ×2 IMPLANT
SUT VICRYL 0 UR6 27IN ABS (SUTURE) ×6 IMPLANT
SYR 27GX1/2 1ML LL SAFETY (SYRINGE) ×3 IMPLANT
TOWEL OR 17X26 10 PK STRL BLUE (TOWEL DISPOSABLE) ×2 IMPLANT
TOWEL OR NON WOVEN STRL DISP B (DISPOSABLE) ×3 IMPLANT
WATER STERILE IRR 1000ML POUR (IV SOLUTION) ×6 IMPLANT

## 2019-04-22 NOTE — Anesthesia Postprocedure Evaluation (Signed)
Anesthesia Post Note  Patient: Daniel [name]  Procedure(s) Performed: XI ROBOTIC ASSISTED LAPAROSCOPIC RADICAL PROSTATECTOMY LEVEL 3 (N/A ) LYMPHADENECTOMY, PELVIC (Bilateral )     Patient location during evaluation: PACU Anesthesia Type: General Level of consciousness: awake and alert Pain management: pain level controlled Vital Signs Assessment: post-procedure vital signs reviewed and stable Respiratory status: spontaneous breathing, nonlabored ventilation, respiratory function stable and patient connected to nasal cannula oxygen Cardiovascular status: blood pressure returned to baseline and stable Postop Assessment: no apparent nausea or vomiting Anesthetic complications: no    Last Vitals:  Vitals:   04/22/19 1430 04/22/19 1445  BP: (!) 100/46 126/64  Pulse: 98 92  Resp: 20 17  Temp:    SpO2: 100% 100%    Last Pain:  Vitals:   04/22/19 1445  TempSrc:   PainSc: 10-Worst pain ever                 Montez Hageman

## 2019-04-22 NOTE — Interval H&P Note (Signed)
History and Physical Interval Note:  04/22/2019 10:47 AM  Daniel [name]  has presented today for surgery, with the diagnosis of PROSTATE CANCER.  The various methods of treatment have been discussed with the patient and family. After consideration of risks, benefits and other options for treatment, the patient has consented to  Procedure(s) with comments: XI ROBOTIC ASSISTED LAPAROSCOPIC RADICAL PROSTATECTOMY LEVEL 3 (N/A) - ONLY NEEDS 210 MIN FOR ALL PROCEDURES LYMPHADENECTOMY, PELVIC (Bilateral) as a surgical intervention.  The patient's history has been reviewed, patient examined, no change in status, stable for surgery.  I have reviewed the patient's chart and labs.  Questions were answered to the patient's satisfaction.     [employer]

## 2019-04-22 NOTE — Anesthesia Procedure Notes (Signed)
Procedure Name: Intubation Date/Time: 04/22/2019 11:30 AM Performed by: Claudia Desanctis, CRNA Pre-anesthesia Checklist: Patient identified, Emergency Drugs available, Suction available and Patient being monitored Patient Re-evaluated:Patient Re-evaluated prior to induction Oxygen Delivery Method: Circle system utilized Preoxygenation: Pre-oxygenation with 100% oxygen Induction Type: IV induction Laryngoscope Size: 2 and Miller Grade View: Grade I Tube type: Oral Tube size: 7.5 mm Number of attempts: 1 Airway Equipment and Method: Stylet Placement Confirmation: ETT inserted through vocal cords under direct vision,  positive ETCO2 and breath sounds checked- equal and bilateral Secured at: 22 cm Tube secured with: Tape Dental Injury: Teeth and Oropharynx as per pre-operative assessment

## 2019-04-22 NOTE — Op Note (Signed)
Preoperative diagnosis: Clinically localized adenocarcinoma of the prostate (clinical stage T2 N0 M0)  Postoperative diagnosis: Clinically localized adenocarcinoma of the prostate (clinical stage T2 N0 M0)  Procedure:  1. Robotic assisted laparoscopic radical prostatectomy (left nerve sparing) 2. Bilateral robotic assisted laparoscopic pelvic lymphadenectomy  Surgeon: Pryor Curia. M.D.  Assistant(s): Debbrah Alar, PA-C  An assistant was required for this surgical procedure.  The duties of the assistant included but were not limited to suctioning, passing suture, camera manipulation, retraction. This procedure would not be able to be performed without an Environmental consultant.   Anesthesia: General  Complications: None  EBL: 100 mL  IVF:  2000 mL crystalloid  Specimens: 1. Prostate and seminal vesicles 2. Right pelvic lymph nodes 3. Left pelvic lymph nodes  Disposition of specimens: Pathology  Drains: 1. 20 Fr coude catheter 2. # 19 Blake pelvic drain  Indication: Daniel [name] is a 58 y.o. patient with clinically localized prostate cancer.  After a thorough review of the management options for treatment of prostate cancer, he elected to proceed with surgical therapy and the above procedure(s).  We have discussed the potential benefits and risks of the procedure, side effects of the proposed treatment, the likelihood of the patient achieving the goals of the procedure, and any potential problems that might occur during the procedure or recuperation. Informed consent has been obtained.  Description of procedure:  The patient was taken to the operating room and a general anesthetic was administered. He was given preoperative antibiotics, placed in the dorsal lithotomy position, and prepped and draped in the usual sterile fashion. Next a preoperative timeout was performed. A urethral catheter was placed into the bladder and a site was selected near the umbilicus for placement of the  camera port. This was placed using a standard open Hassan technique which allowed entry into the peritoneal cavity under direct vision and without difficulty. An 8 mm port was placed and a pneumoperitoneum established. The camera was then used to inspect the abdomen and there was no evidence of any intra-abdominal injuries or other abnormalities except for some omental adhesions at his prior incision site. The remaining abdominal ports were then placed. 8 mm robotic ports were placed in the right lower quadrant, left lower quadrant, and far left lateral abdominal wall. A 5 mm port was placed in the right upper quadrant and a 12 mm port was placed in the right lateral abdominal wall for laparoscopic assistance. All ports were placed under direct vision without difficulty. The omental adhesions were taken down with scissors. The surgical cart was then docked.   Utilizing the cautery scissors, the bladder was reflected posteriorly allowing entry into the space of Retzius and identification of the endopelvic fascia and prostate. The periprostatic fat was then removed from the prostate allowing full exposure of the endopelvic fascia. The endopelvic fascia was then incised from the apex back to the base of the prostate bilaterally and the underlying levator muscle fibers were swept laterally off the prostate thereby isolating the dorsal venous complex. The dorsal vein was then stapled and divided with a 45 mm Flex Echelon stapler. Attention then turned to the bladder neck which was divided anteriorly thereby allowing entry into the bladder and exposure of the urethral catheter. The catheter balloon was deflated and the catheter was brought into the operative field and used to retract the prostate anteriorly. The posterior bladder neck was then examined and was divided allowing further dissection between the bladder and prostate posteriorly until the vasa  deferentia and seminal vessels were identified. The vasa  deferentia were isolated, divided, and lifted anteriorly. The seminal vesicles were dissected down to their tips with care to control the seminal vascular arterial blood supply. These structures were then lifted anteriorly and the space between Denonvillier's fascia and the anterior rectum was developed with a combination of sharp and blunt dissection. This isolated the vascular pedicles of the prostate.  The lateral prostatic fascia on the left side of the prostate was then sharply incised allowing release of the neurovascular bundle. The vascular pedicle of the prostate on the left side was then ligated with Weck clips between the prostate and neurovascular bundle and divided with sharp cold scissor dissection resulting in neurovascular bundle preservation. On the right side, a wide non nerve sparing dissection was performed with Weck clips used to ligate the vascular pedicle of the prostate. The neurovascular bundle on the left side was then separated off the apex of the prostate and urethra.  The urethra was then sharply transected allowing the prostate specimen to be disarticulated. The pelvis was copiously irrigated and hemostasis was ensured. There was no evidence for rectal injury.  Attention then turned to the right pelvic sidewall. The fibrofatty tissue between the external iliac vein, confluence of the iliac vessels, hypogastric artery, and Cooper's ligament was dissected free from the pelvic sidewall with care to preserve the obturator nerve. Weck clips were used for lymphostasis and hemostasis. An identical procedure was performed on the contralateral side and the lymphatic packets were removed for permanent pathologic analysis.  Attention then turned to the urethral anastomosis. A 2-0 Vicryl slip knot was placed between Denonvillier's fascia, the posterior bladder neck, and the posterior urethra to reapproximate these structures. A double-armed 3-0 Monocryl suture was then used to perform a  360 running tension-free anastomosis between the bladder neck and urethra. A new urethral catheter was then placed into the bladder and irrigated. There were no blood clots within the bladder and the anastomosis appeared to be watertight. A #19 Blake drain was then brought through the left lateral 8 mm port site and positioned appropriately within the pelvis. It was secured to the skin with a nylon suture. The surgical cart was then undocked. The right lateral 12 mm port site was closed at the fascial level with a 0 Vicryl suture placed laparoscopically. All remaining ports were then removed under direct vision. The prostate specimen was removed intact within the Endopouch retrieval bag via the periumbilical camera port site. This fascial opening was closed with two running 0 Vicryl sutures. 0.25% Marcaine was then injected into all port sites and all incisions were reapproximated at the skin level with 4-0 Monocryl subcuticular sutures and Dermabond. The patient appeared to tolerate the procedure well and without complications. The patient was able to be extubated and transferred to the recovery unit in satisfactory condition.   Pryor Curia MD

## 2019-04-22 NOTE — Progress Notes (Signed)
Patient ID: Daniel [name], male   DOB: 25-Sep-1961, 58 y.o.   MRN: [id number]  Post-op note  Subjective: The patient is doing well.  No complaints.  Objective: Vital signs in last 24 hours: Temp:  [97.4 F (36.3 C)-97.8 F (36.6 C)] 97.6 F (36.4 C) (05/07 1606) Pulse Rate:  [79-98] 87 (05/07 1606) Resp:  [15-30] 15 (05/07 1606) BP: (100-170)/(46-101) 135/70 (05/07 1606) SpO2:  [99 %-100 %] 100 % (05/07 1606) Weight:  [110.2 kg] 110.2 kg (05/07 0958)  Intake/Output from previous day: No intake/output data recorded. Intake/Output this shift: Total I/O In: 3120 [I.V.:2020; IV Piggyback:1100] Out: 635 [Urine:500; Drains:35; Blood:100]  Physical Exam:  General: Alert and oriented. Abdomen: Soft, Nondistended. Incisions: Clean and dry. GU: Urine draining well.  Lab Results: Recent Labs    04/22/19 1443  HGB 12.4*  HCT 40.6    Assessment/Plan: POD#0   1) Continue to monitor, ambulate, IS   Pryor Curia. MD   LOS: 0 days   Dutch Gray 04/22/2019, 4:43 PM

## 2019-04-22 NOTE — Transfer of Care (Signed)
Immediate Anesthesia Transfer of Care Note  Patient: Daniel [name]  Procedure(s) Performed: XI ROBOTIC ASSISTED LAPAROSCOPIC RADICAL PROSTATECTOMY LEVEL 3 (N/A ) LYMPHADENECTOMY, PELVIC (Bilateral )  Patient Location: PACU  Anesthesia Type:General  Level of Consciousness: awake and drowsy  Airway & Oxygen Therapy: Patient Spontanous Breathing and Patient connected to face mask  Post-op Assessment: Report given to RN and Post -op Vital signs reviewed and stable  Post vital signs: Reviewed and stable  Last Vitals:  Vitals Value Taken Time  BP 119/62 04/22/2019  2:23 PM  Temp    Pulse    Resp 28 04/22/2019  2:24 PM  SpO2    Vitals shown include unvalidated device data.  Last Pain:  Vitals:   04/22/19 1004  TempSrc: Oral         Complications: No apparent anesthesia complications

## 2019-04-22 NOTE — Discharge Instructions (Signed)

## 2019-04-23 ENCOUNTER — Encounter (HOSPITAL_COMMUNITY): Payer: Self-pay | Admitting: Urology

## 2019-04-23 DIAGNOSIS — C61 Malignant neoplasm of prostate: Secondary | ICD-10-CM | POA: Diagnosis not present

## 2019-04-23 LAB — HEMOGLOBIN AND HEMATOCRIT, BLOOD
HCT: 41.5 % (ref 39.0–52.0)
Hemoglobin: 12.4 g/dL — ABNORMAL LOW (ref 13.0–17.0)

## 2019-04-23 MED ORDER — BISACODYL 10 MG RE SUPP
10.0000 mg | Freq: Once | RECTAL | Status: AC
  Administered 2019-04-23: 08:00:00 10 mg via RECTAL
  Filled 2019-04-23: qty 1

## 2019-04-23 MED ORDER — TRAMADOL HCL 50 MG PO TABS
50.0000 mg | ORAL_TABLET | Freq: Four times a day (QID) | ORAL | Status: DC | PRN
  Administered 2019-04-23: 12:00:00 100 mg via ORAL
  Filled 2019-04-23: qty 2

## 2019-04-23 NOTE — Progress Notes (Signed)
Patient ID: Daniel [name], male   DOB: 10/25/1961, 58 y.o.   MRN: [id number]  1 Day Post-Op Subjective: The patient is doing well.  No nausea or vomiting. Pain is adequately controlled.  Objective: Vital signs in last 24 hours: Temp:  [97.3 F (36.3 C)-98.2 F (36.8 C)] 98.2 F (36.8 C) (05/08 0539) Pulse Rate:  [75-98] 75 (05/08 0539) Resp:  [15-30] 17 (05/08 0539) BP: (100-170)/(46-101) 145/78 (05/08 0539) SpO2:  [98 %-100 %] 98 % (05/08 0539) Weight:  [110.2 kg] 110.2 kg (05/07 0958)  Intake/Output from previous day: 05/07 0701 - 05/08 0700 In: 5397 [P.O.:120; I.V.:3960; IV Piggyback:1150] Out: 6734 [Urine:3550; Drains:85; Blood:100] Intake/Output this shift: No intake/output data recorded.  Physical Exam:  General: Alert and oriented. CV: RRR Lungs: Clear bilaterally. GI: Soft, Nondistended. Incisions: Clean, dry, and intact Urine: Clear Extremities: Nontender, no erythema, no edema.  Lab Results: Recent Labs    04/22/19 1443 04/23/19 0415  HGB 12.4* 12.4*  HCT 40.6 41.5      Assessment/Plan: POD# 1 s/p robotic prostatectomy.  1) SL IVF 2) Ambulate, Incentive spirometry 3) Transition to oral pain medication 4) Dulcolax suppository 5) D/C pelvic drain 6) Plan for likely discharge later today   Daniel [name]. MD   LOS: 0 days   Daniel [name] 04/23/2019, 8:51 AM

## 2019-04-23 NOTE — Discharge Summary (Signed)
  Date of admission: 04/22/2019  Date of discharge: 04/23/2019  Admission diagnosis: Prostate Cancer  Discharge diagnosis: Prostate Cancer  History and Physical: For full details, please see admission history and physical. Briefly, Daniel [name] is a 58 y.o. gentleman with localized prostate cancer.  After discussing management/treatment options, he elected to proceed with surgical treatment.  Hospital Course: Kraig Genis was taken to the operating room on 04/22/2019 and underwent a robotic assisted laparoscopic radical prostatectomy. He tolerated this procedure well and without complications. Postoperatively, he was able to be transferred to a regular hospital room following recovery from anesthesia.  He was able to begin ambulating the night of surgery. He remained hemodynamically stable overnight.  He had excellent urine output with appropriately minimal output from his pelvic drain and his pelvic drain was removed on POD #1.  He was transitioned to oral pain medication, tolerated a clear liquid diet, and had met all discharge criteria and was able to be discharged home later on POD#1.  Laboratory values:  Recent Labs    04/22/19 1443 04/23/19 0415  HGB 12.4* 12.4*  HCT 40.6 41.5    Disposition: Home  Discharge instruction: He was instructed to be ambulatory but to refrain from heavy lifting, strenuous activity, or driving. He was instructed on urethral catheter care.  Discharge medications:   Allergies as of 04/23/2019      Reactions   Oxycodone Other (See Comments)   syncope   Lisinopril Cough      Medication List    STOP taking these medications   aspirin 81 MG tablet   multivitamin with minerals tablet     TAKE these medications   amLODipine 10 MG tablet Commonly known as:  NORVASC TAKE 1 TABLET BY MOUTH EVERY DAY FOR BLOOD PRESSURE What changed:    how much to take  how to take this  when to take this  additional instructions   pantoprazole 40 MG  tablet Commonly known as:  PROTONIX Take 40 mg by mouth daily.   pravastatin 40 MG tablet Commonly known as:  PRAVACHOL Take 1 tablet (40 mg total) by mouth daily.   sulfamethoxazole-trimethoprim 800-160 MG tablet Commonly known as:  BACTRIM DS Take 1 tablet by mouth 2 (two) times daily. Start the day prior to foley removal appointment   traMADol 50 MG tablet Commonly known as:  Ultram Take 1-2 tablets (50-100 mg total) by mouth every 6 (six) hours as needed for moderate pain or severe pain.       Followup: He will followup in 1 week for catheter removal and to discuss his surgical pathology results.

## 2019-09-16 ENCOUNTER — Other Ambulatory Visit: Payer: Self-pay

## 2019-09-16 DIAGNOSIS — Z20822 Contact with and (suspected) exposure to covid-19: Secondary | ICD-10-CM

## 2019-09-17 LAB — NOVEL CORONAVIRUS, NAA: SARS-CoV-2, NAA: NOT DETECTED

## 2019-11-23 ENCOUNTER — Other Ambulatory Visit: Payer: Self-pay | Admitting: Family Medicine

## 2020-01-23 ENCOUNTER — Telehealth: Payer: Self-pay | Admitting: Family Medicine

## 2020-01-23 DIAGNOSIS — R911 Solitary pulmonary nodule: Secondary | ICD-10-CM

## 2020-01-23 NOTE — Telephone Encounter (Signed)
Due for follow-up CT chest regarding lung nodule.  I put in the order.  Thanks.

## 2020-02-03 ENCOUNTER — Ambulatory Visit: Admission: RE | Admit: 2020-02-03 | Payer: 59 | Source: Ambulatory Visit

## 2020-02-10 ENCOUNTER — Other Ambulatory Visit: Payer: Self-pay

## 2020-02-10 ENCOUNTER — Ambulatory Visit
Admission: RE | Admit: 2020-02-10 | Discharge: 2020-02-10 | Disposition: A | Discharge status: Home / Self Care | Payer: 59 | Source: Ambulatory Visit | Attending: Family Medicine | Admitting: Family Medicine

## 2020-02-10 DIAGNOSIS — R911 Solitary pulmonary nodule: Secondary | ICD-10-CM | POA: Diagnosis present

## 2020-02-11 ENCOUNTER — Telehealth: Payer: Self-pay

## 2020-02-11 DIAGNOSIS — R222 Localized swelling, mass and lump, trunk: Secondary | ICD-10-CM

## 2020-02-11 NOTE — Telephone Encounter (Signed)
CT Results in Epic:  IMPRESSION: 1. Interval development of anterior mediastinal soft tissue with a confluent region measuring 2 x 1.5 cm. Follow-up CT chest in 3 months recommended to ensure stability as thymic neoplasm in the foam a could present with this appearance. 2. 3 mm right middle lobe pulmonary nodule seen on the previous study has resolved in the interval. 3. New 2 mm posterior right upper lobe pulmonary nodule. No follow-up needed if patient is low-risk. Non-contrast chest CT can be considered in 12 months if patient is high-risk. This recommendation follows the consensus statement: Guidelines for Management of Incidental Pulmonary Nodules Detected on CT Images: From the Fleischner Society 2017; Radiology 2017; 284:228-243.

## 2020-02-11 NOTE — Telephone Encounter (Signed)
Called and talked to patient.  Prev nodule resolved.  Will plan on repeat CT in 1 year given the new nodule.  Can tentatively plan for 3 month CT f/u re: anterior mediastinal soft tissue mass but I would like CT surgery input on this in the meantime.  D/w pt.  He agrees.  Referral placed.    He is feeling well, has no chest sx, no pain/SOB, this findings are incidental.

## 2020-02-18 ENCOUNTER — Other Ambulatory Visit: Payer: Self-pay

## 2020-02-18 ENCOUNTER — Institutional Professional Consult (permissible substitution): Payer: 59 | Admitting: Thoracic Surgery (Cardiothoracic Vascular Surgery)

## 2020-02-18 ENCOUNTER — Encounter: Payer: Self-pay | Admitting: Thoracic Surgery (Cardiothoracic Vascular Surgery)

## 2020-02-18 VITALS — BP 186/101 | HR 86 | Temp 97.7°F | Resp 20 | Ht 71.0 in | Wt 260.0 lb

## 2020-02-18 DIAGNOSIS — J9859 Other diseases of mediastinum, not elsewhere classified: Secondary | ICD-10-CM

## 2020-02-18 NOTE — Progress Notes (Signed)
[name] Date of Birth: 23-Feb-1961  Referring: Tonia Ghent, MD Primary Care: Tonia Ghent, MD Primary Cardiologist: No primary care provider on file.  Chief Complaint:    Chief Complaint  Patient presents with  . Lung Lesion    Surgical consult, CT 02/10/20    History of Present Illness:    Daniel [name] 59 y.o. male presents for surgical evaluation of an anterior mediastinal mass that was discovered incidentally on cross-sectional.  He has a history of prostate cancer and underwent resection in May 2020, was noted to have a pulmonary nodule on the scan from February 2020.  He underwent a repeat scan in February of this year which showed resolution of the pulmonary nodule, but fullness in his anterior mediastinum.  This measured about 2 cm.  He states that he has not had any symptoms.  He denies any chest pain, or pressure.  He denies any muscle weakness, fevers, chills, night sweats, and his weight has been stable.  He denies any vision changes.      Past Medical History:  Diagnosis Date  . Abnormal EKG   . 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 Chest pain    12/12:  Lexiscan Myoview demonstrated no ischemia or scar.  His EF was 41%.; Echocardiogram 01/16/12: LVH, EF [id number], diastolic function normal, no wall motion abnormalities, moderate LAE, mild RVE   . Esophageal stricture 11/08/2013  . GERD (gastroesophageal reflux disease) 02-18-12   none in 5 months  . Heart murmur 02-18-12   as child, not now  . Hx of percutaneous left heart catheterization    per patient , no blockages were found and was told he was ok for another 10 to 15 years , reports today 04-14-19 no pain in chest since cath   . Hyperlipidemia   . Hypertension   . Mass of  colon   . 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 Prostate cancer ([place])   . Tubular adenoma of colon 11/2011  . Villous adenoma of right colon 03/12/2012   Laparoscopic right colectomy 02/25/2012     Past Surgical History:  Procedure Laterality Date  . APPENDECTOMY  2013  . COLON RESECTION  02/25/2012   Procedure: LAPAROSCOPIC RIGHT COLON RESECTION;  Surgeon: Adin Hector, MD;  Location: [facility];  Service: General;  Laterality: Right;  laparoscopic assisted right colectomy  . COLONOSCOPY  11/21/11  . LAPAROSCOPIC RIGHT COLON RESECTION  2013   adenomatous polyp  . LEFT HEART CATH AND CORONARY ANGIOGRAPHY N/A 01/28/2019   Procedure: LEFT HEART CATH AND CORONARY ANGIOGRAPHY;  Surgeon: Leonie Man, MD;  Location: [facility];  Service: Cardiovascular;  Laterality: N/A;  . LYMPHADENECTOMY Bilateral 04/22/2019   Procedure: LYMPHADENECTOMY, PELVIC;  Surgeon: [name], MD;  Location: [facility];  Service: Urology;  Laterality: Bilateral;  . PROSTATE BIOPSY    . ROBOT ASSISTED LAPAROSCOPIC RADICAL PROSTATECTOMY N/A 04/22/2019   Procedure: XI ROBOTIC ASSISTED  LAPAROSCOPIC RADICAL PROSTATECTOMY LEVEL 3;  Surgeon: [name], MD;  Location: [facility];  Service: Urology;  Laterality: N/A;  ONLY NEEDS 210 MIN FOR ALL PROCEDURES    Family History  Problem Relation Age of Onset  . Heart disease Mother 27       MI, died at age 59  . Hypertension Mother   . Coronary artery disease Mother   . Alcohol abuse Father        cirrhoisis of liver-- ETOH abuse  . Heart disease Brother   . Colon cancer Neg Hx   . Prostate cancer Neg Hx   . Esophageal cancer Neg Hx   . Rectal cancer Neg Hx   . Stomach cancer Neg Hx      Social History   Tobacco Use  Smoking Status Never Smoker  Smokeless Tobacco Never Used    Social History   Substance and Sexual Activity  Alcohol Use No      Allergies  Allergen Reactions  . Oxycodone Other (See Comments)    syncope  . Lisinopril Cough    Current Outpatient Medications  Medication Sig Dispense Refill  . amLODipine (NORVASC) 10 MG tablet TAKE 1 TABLET BY MOUTH EVERY DAY FOR BLOOD PRESSURE 30 tablet 3  . pantoprazole (PROTONIX) 40 MG tablet Take 40 mg by mouth daily.    . pravastatin (PRAVACHOL) 40 MG tablet TAKE 1 TABLET BY MOUTH EVERY DAY 30 tablet 3  . sildenafil (VIAGRA) 25 MG tablet Take 25 mg by mouth daily as needed for erectile dysfunction.    . sulfamethoxazole-trimethoprim (BACTRIM DS) 800-160 MG tablet Take 1 tablet by mouth 2 (two) times daily. Start the day prior to foley removal appointment 6 tablet 0  . traMADol (ULTRAM) 50 MG tablet Take 1-2 tablets (50-100 mg total) by mouth every 6 (six) hours as needed for moderate pain or severe pain. 20 tablet 0   No current facility-administered medications for this visit.    Review of Systems  Constitutional: Negative.   HENT: Negative.   Eyes: Negative for blurred vision and double vision.  Respiratory: Negative.   Cardiovascular: Negative.   Gastrointestinal: Negative.   Musculoskeletal: Negative.   Neurological: Negative.  Negative for speech change and focal weakness.  Endo/Heme/Allergies: Does not bruise/bleed easily.    PHYSICAL EXAMINATION: BP (!) 186/101 (BP Location: Right Arm)   Pulse 86   Temp 97.7 F (36.5 C) (Temporal)   Resp 20   Ht 5\' 11"  (1.803 m)   Wt 260 lb (117.9 kg)   SpO2 98% Comment: RA  BMI 36.26 kg/m   Physical Exam  Constitutional: He is oriented to person, place, and time and well-developed, well-nourished, and in no distress. No distress.  HENT:  Head: Normocephalic and atraumatic.  Eyes: Conjunctivae and EOM are normal.  Neck: No tracheal deviation present. No thyromegaly present.  Cardiovascular: Normal rate and regular rhythm.  No murmur heard. Pulmonary/Chest: Effort normal and breath sounds normal. No  respiratory distress.  Abdominal: Soft. He exhibits no distension.  Musculoskeletal:        General: Normal range of motion.     Cervical back: Normal range of motion and neck supple.  Lymphadenopathy:    He has no cervical adenopathy.  Neurological: He is alert and oriented to person, place, and time.  Skin: Skin is warm and dry. He is not diaphoretic.     Diagnostic Studies & Laboratory data:     Recent Radiology Findings:   CT Chest Wo Contrast  Result Date: 02/11/2020 CLINICAL DATA:  Pulmonary nodule. EXAM: CT CHEST WITHOUT CONTRAST TECHNIQUE: Multidetector CT imaging of the chest was performed following the standard protocol without IV contrast. COMPARISON:  01/27/2019 FINDINGS: Cardiovascular: The heart size is normal. No substantial pericardial effusion. No thoracic aortic aneurysm. Mediastinum/Nodes: Subtle wispy soft tissue in the anterior mediastinum on the prior study is now more prominent prominent with a confluent substernal area measuring 2.0 x 1.5 cm (39/2). No mediastinal lymphadenopathy. No evidence for gross hilar lymphadenopathy although assessment is limited by the lack of intravenous contrast on today's study. The esophagus has normal imaging features. There is no axillary lymphadenopathy. Lungs/Pleura: 3 mm right middle lobe pulmonary nodule identified on the previous study has resolved in the interval. There is a new 2 mm posterior right upper lobe nodule seen peripherally on 77/3. No focal airspace consolidation. No pleural effusion. Upper Abdomen: Unremarkable. Musculoskeletal: No worrisome lytic or sclerotic osseous abnormality. IMPRESSION: 1. Interval development of anterior mediastinal soft tissue with a confluent region measuring 2 x 1.5 cm. Follow-up CT chest in 3 months recommended to ensure stability as thymic neoplasm in the foam a could present with this appearance. 2. 3 mm right middle lobe pulmonary nodule seen on the previous study has resolved in the interval. 3.  New 2 mm posterior right upper lobe pulmonary nodule. No follow-up needed if patient is low-risk. Non-contrast chest CT can be considered in 12 months if patient is high-risk. This recommendation follows the consensus statement: Guidelines for Management of Incidental Pulmonary Nodules Detected on CT Images: From the Fleischner Society 2017; Radiology 2017; 284:228-243. These results will be called to the ordering clinician or representative by the Radiologist Assistant, and communication documented in the PACS or zVision Dashboard. Electronically Signed   By: Misty Stanley M.D.   On: 02/11/2020 08:34       I have independently reviewed the above radiology studies  and reviewed the findings with the patient.   Recent Lab Findings: Lab Results  Component Value Date   WBC 5.0 04/14/2019   HGB 12.4 (L) 04/23/2019   HCT 41.5 04/23/2019   PLT 229 04/14/2019   GLUCOSE 91 04/14/2019   CHOL 153 01/27/2019   TRIG 78 01/27/2019   HDL 32 (L) 01/27/2019   LDLDIRECT 168.6 07/12/2013   LDLCALC 105 (H) 01/27/2019   ALT 24 01/27/2019   AST 17 01/27/2019   NA 138 04/14/2019   K 4.2 04/14/2019   CL 105 04/14/2019   CREATININE 1.07 04/14/2019   BUN 15 04/14/2019   CO2 26 04/14/2019   TSH 0.955 01/27/2019   INR 1.18 11/27/2011   HGBA1C 5.6 04/14/2019     Assessment / Plan:   59 year old male with history of prostate cancer presents with 2 cm nodule in his anterior mediastinum.  On review of his previous cross-sectional, there was some fullness in his anterior mediastinum but there was no circumscribed nodule.  Given that he has no symptoms, and based on the appearance on CT I will follow up with repeat imaging in 3 months.  If it is still present then he will get a full work-up for an anterior mediastinal mass.  He and his wife are agreeable to this plan.      Lajuana Matte 02/18/2020 1:25 PM

## 2020-03-10 ENCOUNTER — Other Ambulatory Visit: Payer: Self-pay

## 2020-03-10 ENCOUNTER — Encounter: Payer: Self-pay | Admitting: Internal Medicine

## 2020-03-10 ENCOUNTER — Ambulatory Visit: Payer: 59 | Admitting: Internal Medicine

## 2020-03-10 VITALS — BP 146/90 | HR 72 | Temp 98.8°F | Wt 260.0 lb

## 2020-03-10 DIAGNOSIS — M7022 Olecranon bursitis, left elbow: Secondary | ICD-10-CM

## 2020-03-10 MED ORDER — NAPROXEN 500 MG PO TABS: 500.0000 mg | ORAL_TABLET | Freq: Two times a day (BID) | ORAL | 0 refills | Status: DC

## 2020-03-10 NOTE — Progress Notes (Signed)
Subjective:    Patient ID: Daniel [name], male    DOB: June 13, 1961, 59 y.o.   MRN: [id number]  HPI  Pt presents to the clinic today with c/o left elbow pain. He reports 3 days ago, he hit his elbow while he was working on a car. He reports swelling, bruising and warmth. He denies pain or decreased range of motion. He denies numbness, tingling or weakness in the left upper extremity. He has tried Aleve without any relief.  Review of Systems      Past Medical History:  Diagnosis Date  . Abnormal EKG   . 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 Chest pain    12/12:  Lexiscan Myoview demonstrated no ischemia or scar.  His EF was 41%.; Echocardiogram 01/16/12: LVH, EF [id number], diastolic function normal, no wall motion abnormalities, moderate LAE, mild RVE   . Esophageal stricture 11/08/2013  . GERD (gastroesophageal reflux disease) 02-18-12   none in 5 months  . Heart murmur 02-18-12   as child, not now  . Hx of percutaneous left heart catheterization    per patient , no blockages were found and was told he was ok for another 10 to 15 years , reports today 04-14-19 no pain in chest since cath   . Hyperlipidemia   . Hypertension   . Mass of colon   . 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 Prostate cancer ([place])   . Tubular adenoma of colon 11/2011  . Villous adenoma of right colon 03/12/2012   Laparoscopic right colectomy 02/25/2012     Current Outpatient Medications  Medication Sig Dispense Refill  . amLODipine (NORVASC) 10 MG tablet TAKE 1 TABLET BY MOUTH EVERY DAY FOR BLOOD PRESSURE 30 tablet 3  . pantoprazole (PROTONIX) 40 MG tablet Take 40 mg by mouth daily.    . pravastatin (PRAVACHOL) 40 MG tablet TAKE 1 TABLET BY MOUTH EVERY DAY 30 tablet 3  . sildenafil (VIAGRA) 25 MG tablet Take 25 mg by  mouth daily as needed for erectile dysfunction.    . sulfamethoxazole-trimethoprim (BACTRIM DS) 800-160 MG tablet Take 1 tablet by mouth 2 (two) times daily. Start the day prior to foley removal appointment 6 tablet 0  . traMADol (ULTRAM) 50 MG tablet Take 1-2 tablets (50-100 mg total) by mouth every 6 (six) hours as needed for moderate pain or severe pain. 20 tablet 0   No current facility-administered medications for this visit.    Allergies  Allergen Reactions  . Oxycodone Other (See Comments)    syncope  . Lisinopril Cough    Family History  Problem Relation Age of Onset  . Heart disease Mother 38       MI, died at age 56  . Hypertension Mother   . Coronary artery disease Mother   . Alcohol abuse Father        cirrhoisis of liver-- ETOH abuse  . Heart disease Brother   . Colon cancer Neg Hx   . Prostate cancer Neg Hx   . Esophageal cancer Neg Hx   . Rectal cancer Neg Hx   . Stomach cancer Neg Hx     Social History   Socioeconomic History  . Marital status: Married    Spouse name: Not on file  .  Number of children: 2  . Years of education: Not on file  . Highest education level: Not on file  Occupational History  . Occupation: [employer]    Employer: UNEMPLOYED  Tobacco Use  . Smoking status: Never Smoker  . Smokeless tobacco: Never Used  Substance and Sexual Activity  . Alcohol use: No  . Drug use: No  . Sexual activity: Yes  Other Topics Concern  . 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 Difficulty of Paying Living Expenses:   Food Insecurity:   . Worried About Charity fundraiser in the Last Year:   . Arboriculturist in the Last Year:   Transportation Needs:   . Lexicographer (Medical):   Marland Kitchen Lack of Transportation (Non-Medical):   Physical Activity:   . Days of Exercise per Week:   . Minutes of Exercise per Session:   Stress:   . Feeling of Stress :   Social Connections:   . Frequency of Communication with Friends and Family:   . Frequency of Social Gatherings with Friends and Family:   . Attends Religious Services:   . Active Member of Clubs or Organizations:   . Attends Archivist Meetings:   Marland Kitchen Marital Status:   Intimate Partner Violence:   . Fear of Current or Ex-Partner:   . Emotionally Abused:   Marland Kitchen Physically Abused:   . Sexually Abused:      Constitutional: Denies fever, malaise, fatigue, headache or abrupt weight changes.  Respiratory: Denies difficulty breathing, shortness of breath, cough or sputum production.   Cardiovascular: Denies chest pain, chest tightness, palpitations or swelling in the hands or feet.  Musculoskeletal: Pt reports left elbow swelling. Denies decrease in range of motion, difficulty with gait, muscle pain or joint pain.  Skin: Pt reports bruising and warmth of left elbow. Denies rashes, lesions or ulcercations.  Neurological: Denies numbness, tingling, weakness or problems  coordination.    No other specific complaints in a complete review of systems (except as listed in HPI above).  Objective:   Physical Exam BP (!) 146/90   Pulse 72   Temp 98.8 F (37.1 C) (Temporal)   Wt 260 lb (117.9 kg)   SpO2 97%   BMI 36.26 kg/m   Wt Readings from Last 3 Encounters:  02/18/20 260 lb (117.9 kg)  04/22/19 243 lb (110.2 kg)  04/14/19 243 lb (110.2 kg)    General: Appears his stated age, obese, in NAD. Skin: Warm, dry and intact. Bruising noted of medial left elbow extending down the foreram. No warmth or redness noted of the left elbow. Cardiovascular: Normal rate and rhythm. Radial pulse 2+ on the left. Pulmonary/Chest: Normal effor. Musculoskeletal: Normal flexion, extension and rotation of  the left elbow. Swelling of the left olecranon bursa noted. Neurological: Alert and oriented. Sensation intact to LUE.   BMET    Component Value Date/Time   NA 138 04/14/2019 1057   K 4.2 04/14/2019 1057   CL 105 04/14/2019 1057   CO2 26 04/14/2019 1057   GLUCOSE 91 04/14/2019 1057   BUN 15 04/14/2019 1057   CREATININE 1.07 04/14/2019 1057  CALCIUM 9.0 04/14/2019 1057   GFRNONAA >60 04/14/2019 1057   GFRAA >60 04/14/2019 1057    Lipid Panel     Component Value Date/Time   CHOL 153 01/27/2019 1543   TRIG 78 01/27/2019 1543   HDL 32 (L) 01/27/2019 1543   CHOLHDL 4.8 01/27/2019 1543   VLDL 16 01/27/2019 1543   LDLCALC 105 (H) 01/27/2019 1543    CBC    Component Value Date/Time   WBC 5.0 04/14/2019 1057   RBC 5.47 04/14/2019 1057   HGB 12.4 (L) 04/23/2019 0415   HCT 41.5 04/23/2019 0415   PLT 229 04/14/2019 1057   MCV 83.9 04/14/2019 1057   MCH 25.8 (L) 04/14/2019 1057   MCHC 30.7 04/14/2019 1057   RDW 15.0 04/14/2019 1057   LYMPHSABS 2.9 02/18/2012 1435   MONOABS 0.4 02/18/2012 1435   EOSABS 0.2 02/18/2012 1435   BASOSABS 0.0 02/18/2012 1435    Hgb A1C Lab Results  Component Value Date   HGBA1C 5.6 04/14/2019            Assessment & Plan:  Olecranon Bursitis, Left:  Compression wrap on during the day, off at night RX for Naproxen 500 mg 2 x day with food, avoid other NSAID's OTC Ice for 10 minutes 3 x day Elbow exercise given  Return precautions discussed     Webb Silversmith, NP This visit occurred during the SARS-CoV-2 public health emergency.  Safety protocols were in place, including screening questions prior to the visit, additional usage of staff PPE, and extensive cleaning of exam room while observing appropriate contact time as indicated for disinfecting solutions.

## 2020-03-10 NOTE — Patient Instructions (Signed)
Compression wrap on during the day, off at night RX for Naproxen 500 mg 2 x day with food, avoid other NSAID's OTC Ice for 10 minutes 3 x day     Elbow Bursitis Rehab Ask your health care provider which exercises are safe for you. Do exercises exactly as told by your health care provider and adjust them as directed. It is normal to feel mild stretching, pulling, tightness, or discomfort as you do these exercises. Stop right away if you feel sudden pain or your pain gets worse. Do not begin these exercises until told by your health care provider. Stretching and range-of-motion exercises These exercises warm up your muscles and joints and improve the movement and flexibility of your elbow. The exercises also help to relieve pain and swelling. Elbow flexion, assisted 1. Stand or sit with your left / right arm at your side. 2. Use your other hand to gently push your left / right hand toward your shoulder (assisted) while bending your elbow (flexion). 3. Hold this position for __________ seconds. 4. Slowly return your left / right arm to the starting position. Repeat __________ times. Complete this exercise __________ times a day. Elbow extension, assisted 1. Lie on your back in a comfortable position that allows you to relax your arm muscles. 2. Place a folded towel under your left / right upper arm so that your elbow and shoulder are at the same height. 3. Use your other arm to raise your left / right arm (assisted) until your elbow and hand do not rest on the bed or towel. Hold your left / right arm out straight with your other hand supporting it. 4. Let the weight of your hand straighten your elbow (extension). You should feel a stretch on the inside of your elbow. ? Keep your arm and chest muscles relaxed. ? If directed, add a small wrist weight or hand weight to increase the stretch. 5. Hold this position for __________ seconds. 6. Slowly release the stretch and return to the starting  position. Repeat __________ times. Complete this exercise __________ times a day. Elbow flexion, active 1. Stand or sit with your left / right elbow bent and your palm facing in, toward your body. 2. Bend your elbow as far as you can using only your arm muscles (active flexion). 3. Hold this position for __________ seconds. 4. Slowly return to the starting position. Repeat __________ times. Complete this exercise __________ times a day. Elbow extension, active 1. Stand or sit with your left / right elbow bent and your palm facing in, toward your body. 2. Slowly straighten your elbow using only your arm muscles (active extension). Stop when you feel a gentle stretch at the front of your arm, or when your arm is straight. 3. Hold this position for __________ seconds. 4. Slowly return to the starting position. Repeat __________ times. Complete this exercise __________ times a day. Strengthening exercises These exercises build strength and endurance in your elbow. Endurance is the ability to use your muscles for a long time, even after they get tired. Elbow flexion, isometric 1. Stand or sit with your left / right arm at waist height. Your palm should face in, toward your body. 2. Place your other hand on top of your left / right forearm. Gently push down while you resist with your left / right arm (isometric flexion). ? Use about 50% effort with both arms. You may be instructed to use more and more effort with your arms each week. ?  Try not to let your left / right arm move during the exercise. 3. Hold this position for __________ seconds. 4. Let your muscles relax completely before you repeat the exercise. Repeat __________ times. Complete this exercise __________ times a day. Elbow extension, isometric  1. Stand or sit with your left / right arm at waist height. Your palm should face in, toward your body. 2. Place your other hand on the bottom of your left / right forearm. Gently push up  while you resist with your left / right arm (isometric extension). ? Use about 50% effort with both arms. You may be instructed to use more and more effort with your arms each week. ? Try not to let your left / right arm move during the exercise. 3. Hold this position for __________ seconds. 4. Let your muscles relax completely before you repeat the exercise. Repeat __________ times. Complete this exercise __________ times a day. Biceps curls 1. Sit on a stable chair without armrests, or stand up. 2. Hold a _________ weight in your left / right hand. Your palm should face out, away from your body, at the starting position. 3. Bend your left / right elbow and move your hand up toward your shoulder. Keep your elbow at your side while you bend it. 4. Slowly return to the starting position. Repeat __________ times. Complete this exercise __________ times a day. Triceps curls  1. Lie on your back. 2. Hold a _________ weight in your left / right hand. 3. Bend your left / right elbow to a 90-degree angle (right angle), so the weight is in front of your face, over your chest, and your elbow is pointed up to the ceiling. 4. Straighten your elbow, raising your hand toward the ceiling. Use your other hand to support your left / right upper arm and to keep it still. 5. Slowly return to the starting position. Repeat __________ times. Complete this exercise __________ times a day. This information is not intended to replace advice given to you by your health care provider. Make sure you discuss any questions you have with your health care provider. Document Revised: 03/25/2019 Document Reviewed: 12/23/2018 Elsevier Patient Education  [place].

## 2020-03-20 ENCOUNTER — Ambulatory Visit: Payer: 59 | Admitting: Family Medicine

## 2020-03-22 ENCOUNTER — Other Ambulatory Visit: Payer: Self-pay | Admitting: Family Medicine

## 2020-05-08 ENCOUNTER — Other Ambulatory Visit: Payer: Self-pay | Admitting: Thoracic Surgery (Cardiothoracic Vascular Surgery)

## 2020-05-08 DIAGNOSIS — I712 Thoracic aortic aneurysm, without rupture, unspecified: Secondary | ICD-10-CM

## 2020-05-21 ENCOUNTER — Other Ambulatory Visit: Payer: Self-pay | Admitting: Family Medicine

## 2020-06-08 ENCOUNTER — Ambulatory Visit
Admission: RE | Admit: 2020-06-08 | Discharge: 2020-06-08 | Disposition: A | Discharge status: Home / Self Care | Payer: 59 | Source: Ambulatory Visit | Attending: Thoracic Surgery (Cardiothoracic Vascular Surgery) | Admitting: Thoracic Surgery (Cardiothoracic Vascular Surgery)

## 2020-06-08 ENCOUNTER — Ambulatory Visit: Payer: 59 | Admitting: Thoracic Surgery (Cardiothoracic Vascular Surgery)

## 2020-06-08 ENCOUNTER — Encounter: Payer: Self-pay | Admitting: Thoracic Surgery (Cardiothoracic Vascular Surgery)

## 2020-06-08 ENCOUNTER — Other Ambulatory Visit: Payer: Self-pay

## 2020-06-08 VITALS — BP 159/82 | HR 79 | Temp 98.1°F | Resp 16 | Ht 71.0 in | Wt 265.4 lb

## 2020-06-08 DIAGNOSIS — J9859 Other diseases of mediastinum, not elsewhere classified: Secondary | ICD-10-CM

## 2020-06-08 DIAGNOSIS — I712 Thoracic aortic aneurysm, without rupture, unspecified: Secondary | ICD-10-CM

## 2020-06-08 MED ORDER — IOPAMIDOL (ISOVUE-370) INJECTION 76%
75.0000 mL | Freq: Once | INTRAVENOUS | Status: AC | PRN
  Administered 2020-06-08: 75 mL via INTRAVENOUS

## 2020-06-08 NOTE — Progress Notes (Signed)
     [address]       [place] [address]             [phone]       Mr. Cadman presents for review of cross-sectional imaging. He was seen back in March of this year for an anterior mediastinal nodule that measured 2 cm. He was asymptomatic and it was recommended that he undergo repeat imaging in 3 months. He currently remains without symptoms and repeat CT scan shows no interval change in this pulmonary nodule. 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   Given the stability of this nodule, and his lack of symptoms recommended that he could undergo a repeat chest CT in 6 months. Both he and his wife are agreeable with that plan.

## 2020-06-09 ENCOUNTER — Other Ambulatory Visit: Payer: 59

## 2020-06-09 ENCOUNTER — Ambulatory Visit: Payer: 59 | Admitting: Thoracic Surgery (Cardiothoracic Vascular Surgery)

## 2020-07-20 ENCOUNTER — Other Ambulatory Visit: Payer: Self-pay | Admitting: Family Medicine

## 2020-07-20 NOTE — Telephone Encounter (Signed)
Pt hasn't been seen for a f/u or CPE in over a year and no recent labs, will route to PCP's scheduler to schedule a f/u or CPE and then route refill back to [facility] to fill once appt has been made. Thanks

## 2020-07-31 NOTE — Telephone Encounter (Signed)
Called patient and got him scheduled for CPE and labs. Patient also wanted to make sure his prescriptions could get refilled.

## 2020-08-06 ENCOUNTER — Other Ambulatory Visit: Payer: Self-pay | Admitting: Family Medicine

## 2020-08-06 DIAGNOSIS — Z125 Encounter for screening for malignant neoplasm of prostate: Secondary | ICD-10-CM

## 2020-08-06 DIAGNOSIS — I10 Essential (primary) hypertension: Secondary | ICD-10-CM

## 2020-08-06 DIAGNOSIS — R739 Hyperglycemia, unspecified: Secondary | ICD-10-CM

## 2020-08-10 ENCOUNTER — Other Ambulatory Visit (INDEPENDENT_AMBULATORY_CARE_PROVIDER_SITE_OTHER): Payer: 59

## 2020-08-10 ENCOUNTER — Other Ambulatory Visit: Payer: Self-pay

## 2020-08-10 DIAGNOSIS — I10 Essential (primary) hypertension: Secondary | ICD-10-CM

## 2020-08-10 DIAGNOSIS — Z125 Encounter for screening for malignant neoplasm of prostate: Secondary | ICD-10-CM | POA: Diagnosis not present

## 2020-08-10 DIAGNOSIS — R739 Hyperglycemia, unspecified: Secondary | ICD-10-CM | POA: Diagnosis not present

## 2020-08-10 LAB — HEMOGLOBIN A1C: Hgb A1c MFr Bld: 6 % (ref 4.6–6.5)

## 2020-08-10 LAB — LIPID PANEL
Cholesterol: 152 mg/dL (ref 0–200)
HDL: 41.8 mg/dL (ref >39.00)
LDL Cholesterol: 90 mg/dL (ref 0–99)
NonHDL: 110.15
Total CHOL/HDL Ratio: 4
Triglycerides: 103 mg/dL (ref 0.0–149.0)
VLDL: 20.6 mg/dL (ref 0.0–40.0)

## 2020-08-10 LAB — CBC WITH DIFFERENTIAL/PLATELET
Basophils Absolute: 0.1 10*3/uL (ref 0.0–0.1)
Basophils Relative: 0.9 % (ref 0.0–3.0)
Eosinophils Absolute: 0.3 10*3/uL (ref 0.0–0.7)
Eosinophils Relative: 4.4 % (ref 0.0–5.0)
HCT: 41.5 % (ref 39.0–52.0)
Hemoglobin: 13.6 g/dL (ref 13.0–17.0)
Lymphocytes Relative: 45.5 % (ref 12.0–46.0)
Lymphs Abs: 3.2 10*3/uL (ref 0.7–4.0)
MCHC: 32.8 g/dL (ref 30.0–36.0)
MCV: 80.4 fl (ref 78.0–100.0)
Monocytes Absolute: 0.6 10*3/uL (ref 0.1–1.0)
Monocytes Relative: 8.4 % (ref 3.0–12.0)
Neutro Abs: 2.9 10*3/uL (ref 1.4–7.7)
Neutrophils Relative %: 40.8 % — ABNORMAL LOW (ref 43.0–77.0)
Platelets: 226 10*3/uL (ref 150.0–400.0)
RBC: 5.16 Mil/uL (ref 4.22–5.81)
RDW: 15.2 % (ref 11.5–15.5)
WBC: 7.1 10*3/uL (ref 4.0–10.5)

## 2020-08-10 LAB — COMPREHENSIVE METABOLIC PANEL
ALT: 12 U/L (ref 0–53)
AST: 13 U/L (ref 0–37)
Albumin: 4 g/dL (ref 3.5–5.2)
Alkaline Phosphatase: 52 U/L (ref 39–117)
BUN: 18 mg/dL (ref 6–23)
CO2: 29 mEq/L (ref 19–32)
Calcium: 9.1 mg/dL (ref 8.4–10.5)
Chloride: 103 mEq/L (ref 96–112)
Creatinine, Ser: 1.07 mg/dL (ref 0.40–1.50)
GFR: 85.51 mL/min (ref >60.00)
Glucose, Bld: 96 mg/dL (ref 70–99)
Potassium: 4.2 mEq/L (ref 3.5–5.1)
Sodium: 138 mEq/L (ref 135–145)
Total Bilirubin: 0.5 mg/dL (ref 0.2–1.2)
Total Protein: 7.1 g/dL (ref 6.0–8.3)

## 2020-08-10 LAB — PSA: PSA: 0 ng/mL — ABNORMAL LOW (ref 0.10–4.00)

## 2020-08-18 ENCOUNTER — Encounter: Payer: Self-pay | Admitting: Family Medicine

## 2020-08-18 ENCOUNTER — Other Ambulatory Visit: Payer: Self-pay

## 2020-08-18 ENCOUNTER — Ambulatory Visit (INDEPENDENT_AMBULATORY_CARE_PROVIDER_SITE_OTHER): Payer: 59 | Admitting: Family Medicine

## 2020-08-18 VITALS — BP 142/88 | HR 80 | Temp 97.4°F | Ht 71.0 in | Wt 265.2 lb

## 2020-08-18 DIAGNOSIS — Z0000 Encounter for general adult medical examination without abnormal findings: Secondary | ICD-10-CM | POA: Diagnosis not present

## 2020-08-18 DIAGNOSIS — C61 Malignant neoplasm of prostate: Secondary | ICD-10-CM

## 2020-08-18 DIAGNOSIS — K219 Gastro-esophageal reflux disease without esophagitis: Secondary | ICD-10-CM

## 2020-08-18 DIAGNOSIS — Z7189 Other specified counseling: Secondary | ICD-10-CM

## 2020-08-18 DIAGNOSIS — E782 Mixed hyperlipidemia: Secondary | ICD-10-CM

## 2020-08-18 DIAGNOSIS — I10 Essential (primary) hypertension: Secondary | ICD-10-CM

## 2020-08-18 MED ORDER — PANTOPRAZOLE SODIUM 40 MG PO TBEC: 40.0000 mg | DELAYED_RELEASE_TABLET | Freq: Every day | ORAL | 3 refills | Status: DC

## 2020-08-18 MED ORDER — PRAVASTATIN SODIUM 40 MG PO TABS: 40.0000 mg | ORAL_TABLET | Freq: Every day | ORAL | 3 refills | Status: DC

## 2020-08-18 MED ORDER — AMLODIPINE BESYLATE 10 MG PO TABS: 10.0000 mg | ORAL_TABLET | Freq: Every day | ORAL | 3 refills | Status: DC

## 2020-08-18 NOTE — Progress Notes (Signed)
This visit occurred during the SARS-CoV-2 public health emergency.  Safety protocols were in place, including screening questions prior to the visit, additional usage of staff PPE, and extensive cleaning of exam room while observing appropriate contact time as indicated for disinfecting solutions.  CPE- See plan.  Routine anticipatory guidance given to patient.  See health maintenance.  The possibility exists that previously documented standard health maintenance information may have been brought forward from a previous encounter into this note.  If needed, that same information has been updated to reflect the current situation based on today's encounter.    Tetanus 2014 Flu shot d/w pt.  PNA d/w pt.   Shingles d/w pt.   covid vaccine d/w pt.  Done 2021.  Moderna 02/25/20 and 03/24/20.   HIV and HCV neg 2014 per patient report with the needle stick event 2014 Colonoscopy 2015, note sent to GI about possible follow up.  PSA zero.  Living will. Would have his daughter designated if he were incapacitated.  Diet and exercise d/w pt.  Changed with work as of 2021.    Elevated Cholesterol: Using medications without problems: yes Muscle aches: no Diet compliance: encouraged.   Exercise: encouraged.   Labs d/w pt.    Hypertension:    Using medication without problems or lightheadedness: yes Chest pain with exertion:no Edema:no Short of breath:no Labs d/w pt.    Still on PPI.  GERD controlled with PPI.  No ADE on med.  Swallowing well.  H/o esophageal stricture noted.  No sx now.    Prostate cancer per urology with PSA zero.  D/w pt.    PMH and SH reviewed  Meds, vitals, and allergies reviewed.   ROS: Per HPI.  Unless specifically indicated otherwise in HPI, the patient denies:  General: fever. Eyes: acute vision changes ENT: sore throat Cardiovascular: chest pain Respiratory: SOB GI: vomiting GU: dysuria Musculoskeletal: acute back pain Derm: acute rash Neuro: acute motor  dysfunction Psych: worsening mood Endocrine: polydipsia Heme: bleeding Allergy: hayfever  GEN: nad, alert and oriented HEENT: Normocephalic/atraumatic NECK: supple w/o LA CV: rrr. PULM: ctab, no inc wob ABD: soft, +bs EXT: no edema SKIN: no acute rash

## 2020-08-18 NOTE — Patient Instructions (Addendum)
I would get a flu shot each fall.   Check with your insurance to see if they will cover the shingrix shot.

## 2020-08-21 ENCOUNTER — Encounter: Payer: Self-pay | Admitting: Internal Medicine

## 2020-08-21 NOTE — Assessment & Plan Note (Signed)
Living will.  Would have his daughter designated if he were incapacitated.   

## 2020-08-21 NOTE — Assessment & Plan Note (Signed)
Tetanus 2014 Flu shot d/w pt.  PNA d/w pt.   Shingles d/w pt.   covid vaccine d/w pt.  Done 2021.  Moderna 02/25/20 and 03/24/20.   HIV and HCV neg 2014 per patient report with the needle stick event 2014 Colonoscopy 2015, note sent to GI about possible follow up.  PSA zero.  Living will. Would have his daughter designated if he were incapacitated.  Diet and exercise d/w pt.  Changed with work as of 2021.

## 2020-08-21 NOTE — Assessment & Plan Note (Signed)
Prostate cancer per urology with PSA zero.  D/w pt. I will defer to urology and he agrees.

## 2020-08-21 NOTE — Assessment & Plan Note (Signed)
Labs discussed with patient.  Would continue pravastatin.  Continue work on diet and exercise.  He agrees.

## 2020-08-21 NOTE — Assessment & Plan Note (Signed)
Still on PPI.  GERD controlled with PPI.  No ADE on med.  Swallowing well.  H/o esophageal stricture noted.  No sx now.   Would continue PPI as is.

## 2020-08-21 NOTE — Assessment & Plan Note (Signed)
Minimal elevation of blood pressure.  Would continue amlodipine.  No change in meds at this point.  He will update me as needed.  Continue work on diet and exercise.

## 2020-10-31 ENCOUNTER — Other Ambulatory Visit: Payer: Self-pay | Admitting: *Deleted

## 2020-10-31 DIAGNOSIS — J9859 Other diseases of mediastinum, not elsewhere classified: Secondary | ICD-10-CM

## 2020-11-06 ENCOUNTER — Other Ambulatory Visit: Payer: Self-pay | Admitting: Thoracic Surgery (Cardiothoracic Vascular Surgery)

## 2020-12-01 ENCOUNTER — Ambulatory Visit
Admission: RE | Admit: 2020-12-01 | Discharge: 2020-12-01 | Disposition: A | Discharge status: Home / Self Care | Payer: 59 | Source: Ambulatory Visit | Attending: Thoracic Surgery (Cardiothoracic Vascular Surgery) | Admitting: Thoracic Surgery (Cardiothoracic Vascular Surgery)

## 2020-12-01 ENCOUNTER — Encounter: Payer: Self-pay | Admitting: Thoracic Surgery (Cardiothoracic Vascular Surgery)

## 2020-12-01 ENCOUNTER — Other Ambulatory Visit: Payer: Self-pay

## 2020-12-01 ENCOUNTER — Ambulatory Visit: Payer: 59 | Admitting: Thoracic Surgery (Cardiothoracic Vascular Surgery)

## 2020-12-01 VITALS — BP 169/90 | HR 76 | Resp 20 | Ht 71.0 in | Wt 271.0 lb

## 2020-12-01 DIAGNOSIS — J9859 Other diseases of mediastinum, not elsewhere classified: Secondary | ICD-10-CM

## 2020-12-01 NOTE — Progress Notes (Signed)
     [address]       ,[place] [address]             [phone]       Daniel [name] comes in to discuss results of his cross-sectional imaging in 6 months.  On review his mediastinal mass is stable.  He remains without any symptoms.  Vitals:   12/01/20 1352  BP: (!) 169/90  Pulse: 76  Resp: 20  SpO2: 100%   Alert NAD Regular rate and rhythm Easy work of breathing  I personally reviewed his CT scan, and his anterior mediastinal mass is stable. I will see him back in 1 year with a repeat scan.  If it is stable at this time next course of imaging then he will require no further imaging.  Daniel [name] [name]

## 2020-12-30 IMAGING — NM NUCLEAR MEDICINE WHOLE BODY BONE SCINTIGRAPHY
2 series · 2 of 2 positions shown · non-contrast
Comparison: MRI March 10, 2019.

CLINICAL DATA: History of prostate cancer.

EXAM:
NUCLEAR MEDICINE WHOLE BODY BONE SCAN
TECHNIQUE: Whole body anterior and posterior images were obtained approximately
3 hours after intravenous injection of radiopharmaceutical.
RADIOPHARMACEUTICALS:  21.1 mCi Yechnetium-PPm MDP IV

[Series 1: wbr_bone_40 whole body · 2.66mm/px · 1 of 1 slices shown (1 of 2)]
[im 1/1]
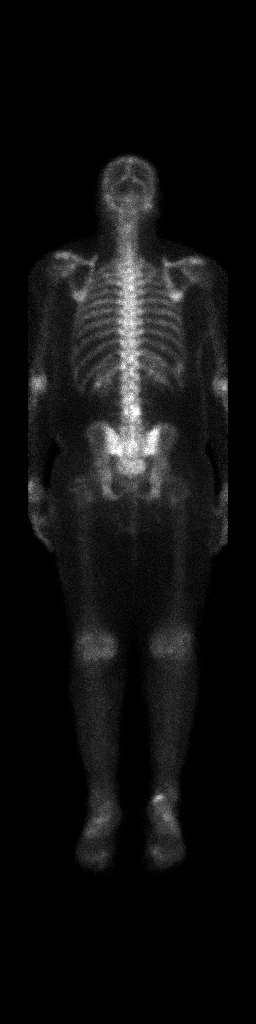

[Series 1: wbr_bone_40 whole body · 2.66mm/px · 1 of 1 slices shown (2 of 2)]
[im 1/1]
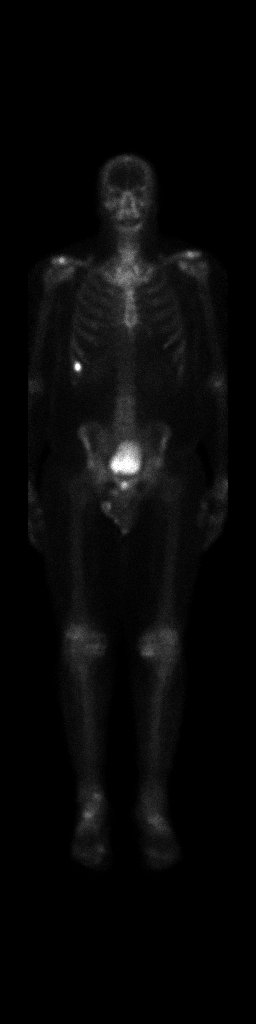

[2 of 2 positions shown; findings below may reference images not displayed]

FINDINGS: Abnormal uptake is noted in both shoulders, knees and feet and
ankles consistent with degenerative change. Large focus of abnormal
uptake is seen involving anterior portion of right lower rib which
may be metastatic or posttraumatic in etiology. There is also noted
a possible focus of abnormal uptake involving the right side of the
L3 level of uncertain etiology. Some degree of urinary contamination
is noted in the groin region.
IMPRESSION: Solitary focus of abnormal uptake is seen involving the anterior
portion of a right lower rib which may be metastatic or
posttraumatic in etiology. Radiographs are recommended for further
evaluation.

Possible mild focus of abnormal uptake is noted involving the right
side of the L3 vertebral body which potentially may represent
metastatic focus. MRI may be performed for further evaluation.

Abnormal uptake is seen involving both shoulders, knees, ankles and
feet most consistent with degenerative change.

## 2021-07-13 ENCOUNTER — Encounter: Payer: Self-pay | Admitting: Family Medicine

## 2021-07-13 ENCOUNTER — Other Ambulatory Visit: Payer: Self-pay

## 2021-07-13 ENCOUNTER — Ambulatory Visit: Payer: 59 | Admitting: Family Medicine

## 2021-07-13 VITALS — BP 154/90 | HR 88 | Temp 97.5°F | Ht 71.0 in | Wt 266.0 lb

## 2021-07-13 DIAGNOSIS — R300 Dysuria: Secondary | ICD-10-CM

## 2021-07-13 LAB — POC URINALSYSI DIPSTICK (AUTOMATED)
Bilirubin, UA: NEGATIVE
Blood, UA: POSITIVE
Glucose, UA: NEGATIVE
Ketones, UA: NEGATIVE
Leukocytes, UA: NEGATIVE
Nitrite, UA: NEGATIVE
Protein, UA: POSITIVE — AB
Spec Grav, UA: 1.02 (ref 1.010–1.025)
Urobilinogen, UA: 2 E.U./dL — AB
pH, UA: 6 (ref 5.0–8.0)

## 2021-07-13 MED ORDER — SULFAMETHOXAZOLE-TRIMETHOPRIM 800-160 MG PO TABS: 1.0000 | ORAL_TABLET | Freq: Two times a day (BID) | ORAL | 0 refills | Status: DC

## 2021-07-13 NOTE — Patient Instructions (Addendum)
Drink enough water to keep your urine clear.  We'll culture your urine.   Take care.  Glad to see you.  I presume you have already passed a small stone.   If you have more pain with urination or a fever, then start septra and update [place].    Assuming everything resolves, I want to recheck your urine in 2 weeks.

## 2021-07-13 NOTE — Progress Notes (Signed)
This visit occurred during the SARS-CoV-2 public health emergency.  Safety protocols were in place, including screening questions prior to the visit, additional usage of staff PPE, and extensive cleaning of exam room while observing appropriate contact time as indicated for disinfecting solutions.  Burning pain with urination, worse earlier in the week.  Sig pain 2 days ago.  Stream was weaker a few days ago, better now.  Prev with rectal pain and pain near the B lower back/beltline.  No fevers.  No blood seen in urine or stool.  Clearly feels better today.  No known stone passage.  No h/o UTI or stones.    He had covid but recovered.  His wife had worse sx.  He is going to see about rescheduling with GI re: colonoscopy, d/w pt.    Meds, vitals, and allergies reviewed.   ROS: Per HPI unless specifically indicated in ROS section   GEN: nad, alert and oriented HEENT: ncat NECK: supple w/o LA CV: rrr PULM: ctab, no inc wob ABD: soft, +bs EXT: no edema SKIN: Well-perfused. B lower back minimally ttp. No cva pain.

## 2021-07-14 LAB — URINE CULTURE
MICRO NUMBER:: 12179837
Result:: NO GROWTH
SPECIMEN QUALITY:: ADEQUATE

## 2021-07-15 DIAGNOSIS — R300 Dysuria: Secondary | ICD-10-CM | POA: Insufficient documentation

## 2021-07-15 NOTE — Assessment & Plan Note (Signed)
Fortunately he is clearly improved from a few days ago.  Discussed options Drink enough water to keep your urine clear.  Urine culture pending.  See notes on labs.  Urinalysis discussed with patient. I presume he has already passed a small stone.   If more pain with urination or a fever, then start septra and update [place].    Assuming everything resolves, I want to recheck his urine in 2 weeks.  He agrees with plan.

## 2021-07-27 ENCOUNTER — Other Ambulatory Visit (INDEPENDENT_AMBULATORY_CARE_PROVIDER_SITE_OTHER): Payer: 59

## 2021-07-27 ENCOUNTER — Other Ambulatory Visit: Payer: Self-pay

## 2021-07-27 DIAGNOSIS — R300 Dysuria: Secondary | ICD-10-CM

## 2021-07-27 LAB — URINALYSIS, ROUTINE W REFLEX MICROSCOPIC
Bilirubin Urine: NEGATIVE
Ketones, ur: NEGATIVE
Leukocytes,Ua: NEGATIVE
Nitrite: NEGATIVE
Specific Gravity, Urine: 1.02 (ref 1.000–1.030)
Urine Glucose: NEGATIVE
Urobilinogen, UA: 1 (ref 0.0–1.0)
pH: 6 (ref 5.0–8.0)

## 2021-08-01 ENCOUNTER — Other Ambulatory Visit: Payer: Self-pay | Admitting: Family Medicine

## 2021-08-01 DIAGNOSIS — Z125 Encounter for screening for malignant neoplasm of prostate: Secondary | ICD-10-CM

## 2021-08-01 DIAGNOSIS — E782 Mixed hyperlipidemia: Secondary | ICD-10-CM

## 2021-08-01 DIAGNOSIS — R739 Hyperglycemia, unspecified: Secondary | ICD-10-CM

## 2021-08-14 ENCOUNTER — Other Ambulatory Visit (INDEPENDENT_AMBULATORY_CARE_PROVIDER_SITE_OTHER): Payer: 59

## 2021-08-14 ENCOUNTER — Other Ambulatory Visit: Payer: Self-pay

## 2021-08-14 DIAGNOSIS — E782 Mixed hyperlipidemia: Secondary | ICD-10-CM

## 2021-08-14 DIAGNOSIS — Z125 Encounter for screening for malignant neoplasm of prostate: Secondary | ICD-10-CM

## 2021-08-14 DIAGNOSIS — R739 Hyperglycemia, unspecified: Secondary | ICD-10-CM | POA: Diagnosis not present

## 2021-08-14 LAB — LIPID PANEL
Cholesterol: 134 mg/dL (ref 0–200)
HDL: 39.7 mg/dL (ref >39.00)
LDL Cholesterol: 84 mg/dL (ref 0–99)
NonHDL: 94.08
Total CHOL/HDL Ratio: 3
Triglycerides: 52 mg/dL (ref 0.0–149.0)
VLDL: 10.4 mg/dL (ref 0.0–40.0)

## 2021-08-14 LAB — CBC WITH DIFFERENTIAL/PLATELET
Basophils Absolute: 0.1 10*3/uL (ref 0.0–0.1)
Basophils Relative: 1.1 % (ref 0.0–3.0)
Eosinophils Absolute: 0.2 10*3/uL (ref 0.0–0.7)
Eosinophils Relative: 4.2 % (ref 0.0–5.0)
HCT: 44.5 % (ref 39.0–52.0)
Hemoglobin: 14.3 g/dL (ref 13.0–17.0)
Lymphocytes Relative: 43.4 % (ref 12.0–46.0)
Lymphs Abs: 2.5 10*3/uL (ref 0.7–4.0)
MCHC: 32.1 g/dL (ref 30.0–36.0)
MCV: 80 fl (ref 78.0–100.0)
Monocytes Absolute: 0.4 10*3/uL (ref 0.1–1.0)
Monocytes Relative: 7.4 % (ref 3.0–12.0)
Neutro Abs: 2.6 10*3/uL (ref 1.4–7.7)
Neutrophils Relative %: 43.9 % (ref 43.0–77.0)
Platelets: 219 10*3/uL (ref 150.0–400.0)
RBC: 5.56 Mil/uL (ref 4.22–5.81)
RDW: 15.4 % (ref 11.5–15.5)
WBC: 5.8 10*3/uL (ref 4.0–10.5)

## 2021-08-14 LAB — COMPREHENSIVE METABOLIC PANEL
ALT: 13 U/L (ref 0–53)
AST: 16 U/L (ref 0–37)
Albumin: 4.2 g/dL (ref 3.5–5.2)
Alkaline Phosphatase: 57 U/L (ref 39–117)
BUN: 12 mg/dL (ref 6–23)
CO2: 27 mEq/L (ref 19–32)
Calcium: 9.2 mg/dL (ref 8.4–10.5)
Chloride: 103 mEq/L (ref 96–112)
Creatinine, Ser: 1.05 mg/dL (ref 0.40–1.50)
GFR: 77.27 mL/min (ref >60.00)
Glucose, Bld: 105 mg/dL — ABNORMAL HIGH (ref 70–99)
Potassium: 4.2 mEq/L (ref 3.5–5.1)
Sodium: 138 mEq/L (ref 135–145)
Total Bilirubin: 0.6 mg/dL (ref 0.2–1.2)
Total Protein: 7.4 g/dL (ref 6.0–8.3)

## 2021-08-14 LAB — HEMOGLOBIN A1C: Hgb A1c MFr Bld: 6.2 % (ref 4.6–6.5)

## 2021-08-14 LAB — PSA: PSA: 0 ng/mL — ABNORMAL LOW (ref 0.10–4.00)

## 2021-08-21 ENCOUNTER — Ambulatory Visit (INDEPENDENT_AMBULATORY_CARE_PROVIDER_SITE_OTHER): Payer: 59 | Admitting: Family Medicine

## 2021-08-21 ENCOUNTER — Encounter: Payer: Self-pay | Admitting: Family Medicine

## 2021-08-21 ENCOUNTER — Encounter: Payer: 59 | Admitting: Family Medicine

## 2021-08-21 ENCOUNTER — Other Ambulatory Visit: Payer: Self-pay

## 2021-08-21 VITALS — BP 150/96 | HR 87 | Temp 96.9°F | Ht 71.0 in | Wt 266.0 lb

## 2021-08-21 DIAGNOSIS — Z7189 Other specified counseling: Secondary | ICD-10-CM

## 2021-08-21 DIAGNOSIS — I10 Essential (primary) hypertension: Secondary | ICD-10-CM

## 2021-08-21 DIAGNOSIS — Z0000 Encounter for general adult medical examination without abnormal findings: Secondary | ICD-10-CM

## 2021-08-21 DIAGNOSIS — R140 Abdominal distension (gaseous): Secondary | ICD-10-CM

## 2021-08-21 DIAGNOSIS — C61 Malignant neoplasm of prostate: Secondary | ICD-10-CM

## 2021-08-21 DIAGNOSIS — Z1211 Encounter for screening for malignant neoplasm of colon: Secondary | ICD-10-CM

## 2021-08-21 DIAGNOSIS — E782 Mixed hyperlipidemia: Secondary | ICD-10-CM

## 2021-08-21 MED ORDER — PRAVASTATIN SODIUM 40 MG PO TABS: 40.0000 mg | ORAL_TABLET | Freq: Every day | ORAL | 3 refills | Status: DC

## 2021-08-21 MED ORDER — AMLODIPINE BESYLATE 10 MG PO TABS: 10.0000 mg | ORAL_TABLET | Freq: Every day | ORAL | 3 refills | Status: DC

## 2021-08-21 MED ORDER — PANTOPRAZOLE SODIUM 40 MG PO TBEC: 40.0000 mg | DELAYED_RELEASE_TABLET | Freq: Every day | ORAL | 3 refills | Status: DC

## 2021-08-21 NOTE — Progress Notes (Signed)
This visit occurred during the SARS-CoV-2 public health emergency.  Safety protocols were in place, including screening questions prior to the visit, additional usage of staff PPE, and extensive cleaning of exam room while observing appropriate contact time as indicated for disinfecting solutions.  CPE- See plan.  Routine anticipatory guidance given to patient.  See health maintenance.  The possibility exists that previously documented standard health maintenance information may have been brought forward from a previous encounter into this note.  If needed, that same information has been updated to reflect the current situation based on today's encounter.    Tetanus 2014 Flu shot to be done at work.  PNA due at 65.   Shingles d/w pt.   covid vaccine d/w pt.  Done 2021.  Moderna 02/25/20 and 03/24/20.   HIV and HCV neg 2014 per patient report with the needle stick event 2014 Colonoscopy 2015, referral placed.   PSA zero.  Living will.  Would have his daughter designated if he were incapacitated.   Diet and exercise d/w pt.  Changed with work as of 2022.  Walking at work.    He had noted some bloating with eating.  No blood in stool.  No abd pain.  No fever, no chills.  Normal BMs.     Hypertension:    Using medication without problems or lightheadedness: yes Chest pain with exertion:no Edema:no Short of breath:no Average home BPs: usually controlled 130s/70s, this AM was atypical.    Elevated Cholesterol: Using medications without problems:yes Muscle aches: no Diet compliance: d/w pt.   Exercise: d/w pt.    He'll f/u with thoracic surgery at the end of the year.  I will defer and he will update me as needed.  He agrees to plan.  He'll f/u with urology later this fall, d/w pt.    PMH and SH reviewed  Meds, vitals, and allergies reviewed.   ROS: Per HPI.  Unless specifically indicated otherwise in HPI, the patient denies:  General: fever. Eyes: acute vision changes ENT: sore  throat Cardiovascular: chest pain Respiratory: SOB GI: vomiting GU: dysuria Musculoskeletal: acute back pain Derm: acute rash Neuro: acute motor dysfunction Psych: worsening mood Endocrine: polydipsia Heme: bleeding Allergy: hayfever  GEN: nad, alert and oriented HEENT: ncat NECK: supple w/o LA CV: rrr. PULM: ctab, no inc wob ABD: soft, +bs EXT: no edema SKIN: well perfused.

## 2021-08-21 NOTE — Patient Instructions (Addendum)
We'll call about seeing GI.  If your BP stays above 140/90 or if the abdominal bloating continues, then let me know.   I would get a flu shot each fall.   Check with your insurance to see if they will cover the shingles shot. Take care.  Glad to see you.

## 2021-08-22 DIAGNOSIS — R140 Abdominal distension (gaseous): Secondary | ICD-10-CM | POA: Insufficient documentation

## 2021-08-22 NOTE — Assessment & Plan Note (Signed)
Tetanus 2014 Flu shot to be done at work.  PNA due at 65.   Shingles d/w pt.   covid vaccine d/w pt.  Done 2021.  Moderna 02/25/20 and 03/24/20.   HIV and HCV neg 2014 per patient report with the needle stick event 2014 Colonoscopy 2015, referral placed.   PSA zero.  Living will.  Would have his daughter designated if he were incapacitated.   Diet and exercise d/w pt.  Changed with work as of 2022.  Walking at work.

## 2021-08-22 NOTE — Assessment & Plan Note (Signed)
Continue pravastatin.  Continue work on diet and exercise.  He agrees with plan.

## 2021-08-22 NOTE — Assessment & Plan Note (Signed)
Living will.  Would have his daughter designated if he were incapacitated.   

## 2021-08-22 NOTE — Assessment & Plan Note (Signed)
He had noted some bloating with eating.  No blood in stool.  No abd pain.  No fever, no chills.  Normal BMs.  Unremarkable abdominal exam and we are going to refer him to GI anyway.  I will await GI input.  Unremarkable labs.

## 2021-08-22 NOTE — Assessment & Plan Note (Signed)
History of. He'll f/u with urology later this fall, d/w pt.

## 2021-08-22 NOTE — Assessment & Plan Note (Signed)
Continue amlodipine.  Continue work on diet and exercise.  He agrees with plan. Labs d/w pt.

## 2021-08-28 ENCOUNTER — Encounter (HOSPITAL_COMMUNITY): Payer: Self-pay | Admitting: Emergency Medicine

## 2021-08-28 ENCOUNTER — Telehealth: Payer: Self-pay | Admitting: *Deleted

## 2021-08-28 ENCOUNTER — Emergency Department (HOSPITAL_COMMUNITY): Payer: 59

## 2021-08-28 ENCOUNTER — Other Ambulatory Visit: Payer: Self-pay

## 2021-08-28 ENCOUNTER — Emergency Department (HOSPITAL_COMMUNITY)
Admission: EM | Admit: 2021-08-28 | Discharge: 2021-08-28 | Disposition: A | Discharge status: Home / Self Care | Payer: 59 | Attending: Emergency Medicine | Admitting: Emergency Medicine

## 2021-08-28 DIAGNOSIS — I10 Essential (primary) hypertension: Secondary | ICD-10-CM

## 2021-08-28 DIAGNOSIS — Z79899 Other long term (current) drug therapy: Secondary | ICD-10-CM | POA: Insufficient documentation

## 2021-08-28 DIAGNOSIS — Z8546 Personal history of malignant neoplasm of prostate: Secondary | ICD-10-CM | POA: Insufficient documentation

## 2021-08-28 DIAGNOSIS — Z7982 Long term (current) use of aspirin: Secondary | ICD-10-CM | POA: Insufficient documentation

## 2021-08-28 LAB — CBC
HCT: 48.9 % (ref 39.0–52.0)
Hemoglobin: 15.3 g/dL (ref 13.0–17.0)
MCH: 25.6 pg — ABNORMAL LOW (ref 26.0–34.0)
MCHC: 31.3 g/dL (ref 30.0–36.0)
MCV: 81.9 fL (ref 80.0–100.0)
Platelets: 262 10*3/uL (ref 150–400)
RBC: 5.97 MIL/uL — ABNORMAL HIGH (ref 4.22–5.81)
RDW: 14.8 % (ref 11.5–15.5)
WBC: 6.4 10*3/uL (ref 4.0–10.5)
nRBC: 0 % (ref 0.0–0.2)

## 2021-08-28 LAB — BASIC METABOLIC PANEL
Anion gap: 10 (ref 5–15)
BUN: 12 mg/dL (ref 6–20)
CO2: 27 mmol/L (ref 22–32)
Calcium: 9.4 mg/dL (ref 8.9–10.3)
Chloride: 102 mmol/L (ref 98–111)
Creatinine, Ser: 1.1 mg/dL (ref 0.61–1.24)
GFR, Estimated: >60 mL/min (ref >60)
Glucose, Bld: 115 mg/dL — ABNORMAL HIGH (ref 70–99)
Potassium: 4.2 mmol/L (ref 3.5–5.1)
Sodium: 139 mmol/L (ref 135–145)

## 2021-08-28 LAB — TROPONIN I (HIGH SENSITIVITY)
Troponin I (High Sensitivity): 5 ng/L (ref <18)
Troponin I (High Sensitivity): 5 ng/L (ref <18)

## 2021-08-28 MED ORDER — AMLODIPINE BESYLATE 5 MG PO TABS
10.0000 mg | ORAL_TABLET | Freq: Once | ORAL | Status: AC
  Administered 2021-08-28: 10 mg via ORAL
  Filled 2021-08-28: qty 2

## 2021-08-28 MED ORDER — AMLODIPINE BESYLATE 10 MG PO TABS: 10.0000 mg | ORAL_TABLET | Freq: Two times a day (BID) | ORAL | 3 refills | Status: DC

## 2021-08-28 NOTE — ED Provider Notes (Signed)
[facility] EMERGENCY DEPARTMENT Provider Note   CSN: [id number] Arrival date & time: 08/28/21  1026     History Chief Complaint  Patient presents with   Hypertension   Chest Pain    Daniel [name] is a 60 y.o. male.  HPI 60 year old male with a history of chest pain with negative work-up, GERD, hyperlipidemia, hypertension, prostate cancer, tubular adenoma of the colon presents to the ER with complaints of elevated blood pressures.  Patient saw his PCP on Thursday 9/8, and had an elevated blood pressure of 0000000 systolic.  He was told by his PCP to keep track of it over the next few days and if it did not improve to give the office a call back about a week later.  Patient has checked several times since then and he has had a blood pressure as high as 179/109.  He denies any chest pain, headache, blurry vision, nausea, vomiting, abdominal pain, syncope.  He had 1 brief episode of chest pain that occurred several days ago but this lasted about 3 seconds and then subsided.  He has been compliant with his amlodipine.  He had another blood pressure today with a systolic of [id number], he called his PCPs office and the nurse told him to come to the ER.  Patient has been in the ER waiting room for approximately 9 hours, states he continues to be asymptomatic.    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  (he is an exRunner, broadcasting/film/video)   Prostate cancer ([place])    Tubular adenoma of colon 11/2011   Villous adenoma of right colon 03/12/2012   Laparoscopic right colectomy 02/25/2012     Patient Active Problem List   Diagnosis Date Noted   Abdominal bloating 08/22/2021   Dysuria 07/15/2021   Prostate cancer ([place]) 02/14/2019   Hyperglycemia 02/07/2019   Pulmonary nodule 01/31/2019   Chest pain 01/27/2019   Pain in both knees 09/15/2017   Advance care planning 12/25/2015   Sciatica 08/09/2014   Esophageal stricture 11/08/2013   Syncope 12/06/2011   Routine general medical examination at a health care facility 10/18/2011   Personal history of adenomatous colonic polyps 10/18/2011   GERD (gastroesophageal reflux disease) 06/23/2011   Allergic rhinitis 05/25/2009   Obesity 04/27/2009   ERECTILE DYSFUNCTION 05/12/2008   HYPERLIPIDEMIA 12/31/2007   Hypertension 11/27/2007    Past Surgical History:  Procedure Laterality Date   APPENDECTOMY  2013   COLON RESECTION  02/25/2012  Procedure: LAPAROSCOPIC RIGHT COLON RESECTION;  Surgeon: Adin Hector, MD;  Location: [facility];  Service: General;  Laterality: Right;  laparoscopic assisted right colectomy   COLONOSCOPY  11/21/11   LAPAROSCOPIC RIGHT COLON RESECTION  2013   adenomatous polyp   LEFT HEART CATH AND CORONARY ANGIOGRAPHY N/A 01/28/2019   Procedure: LEFT HEART CATH AND CORONARY ANGIOGRAPHY;  Surgeon: Leonie Man, MD;  Location: [place] [facility];  Service: Cardiovascular;  Laterality: N/A;   LYMPHADENECTOMY Bilateral 04/22/2019   Procedure: LYMPHADENECTOMY, PELVIC;  Surgeon: [name], MD;  Location: [facility];  Service: Urology;  Laterality: Bilateral;   PROSTATE  BIOPSY     ROBOT ASSISTED LAPAROSCOPIC RADICAL PROSTATECTOMY N/A 04/22/2019   Procedure: XI ROBOTIC ASSISTED LAPAROSCOPIC RADICAL PROSTATECTOMY LEVEL 3;  Surgeon: [name], MD;  Location: [facility];  Service: Urology;  Laterality: N/A;  ONLY NEEDS 210 MIN FOR ALL PROCEDURES       Family History  Problem Relation Age of Onset   Heart disease Mother 69       MI, died at age 87   Hypertension Mother    Coronary artery disease Mother    Alcohol abuse Father        cirrhoisis of liver-- ETOH abuse   Heart disease Brother    Colon cancer Neg Hx    Prostate cancer Neg Hx    Esophageal cancer Neg Hx    Rectal cancer Neg Hx    Stomach cancer Neg Hx     Social History   Tobacco Use   Smoking status: Never   Smokeless tobacco: Never  Vaping Use   Vaping Use: Never used  Substance Use Topics   Alcohol use: No   Drug use: No    Home Medications Prior to Admission medications   Medication Sig Start Date End Date Taking? Authorizing Provider  amLODipine (NORVASC) 10 MG tablet Take 1 tablet (10 mg total) by mouth in the morning and at bedtime. 08/28/21   Garald Balding, PA-C  aspirin EC 81 MG tablet Take 81 mg by mouth daily. Swallow whole.    [provider]  naproxen (NAPROSYN) 500 MG tablet Take 1 tablet (500 mg total) by mouth 2 (two) times daily with a meal. 03/10/20   Baity, Coralie Keens, NP  pantoprazole (PROTONIX) 40 MG tablet Take 1 tablet (40 mg total) by mouth daily. 08/21/21   Tonia Ghent, MD  pravastatin (PRAVACHOL) 40 MG tablet Take 1 tablet (40 mg total) by mouth daily. 08/21/21   Tonia Ghent, MD    Allergies    Oxycodone and Lisinopril  Review of Systems   Review of Systems Ten systems reviewed and are negative for acute change, except as noted in the HPI.   Physical Exam Updated Vital Signs BP (!) 179/101   Pulse 77   Temp 98.6 F (37 C) (Oral)   Resp 20   SpO2 99%   Physical Exam Vitals and nursing note reviewed.  Constitutional:       General: He is not in acute distress.    Appearance: He is well-developed. He is not ill-appearing, toxic-appearing or diaphoretic.  HENT:     Head: Normocephalic and atraumatic.  Eyes:     Conjunctiva/sclera: Conjunctivae normal.  Cardiovascular:     Rate and Rhythm: Normal rate and regular rhythm.     Pulses:          Radial pulses are 2+ on the right side and 2+ on the left side.  Heart sounds: No murmur heard. Pulmonary:     Effort: Pulmonary effort is normal. No respiratory distress.     Breath sounds: Normal breath sounds.  Chest:     Chest wall: No tenderness.  Abdominal:     Palpations: Abdomen is soft.     Tenderness: There is no abdominal tenderness.  Musculoskeletal:     Cervical back: Neck supple.  Skin:    General: Skin is warm and dry.  Neurological:     General: No focal deficit present.     Mental Status: He is alert.  Psychiatric:        Mood and Affect: Mood normal.        Behavior: Behavior normal.    ED Results / Procedures / Treatments   Labs (all labs ordered are listed, but only abnormal results are displayed) Labs Reviewed  BASIC METABOLIC PANEL - Abnormal; Notable for the following components:      Result Value   Glucose, Bld 115 (*)    All other components within normal limits  CBC - Abnormal; Notable for the following components:   RBC 5.97 (*)    MCH 25.6 (*)    All other components within normal limits  TROPONIN I (HIGH SENSITIVITY)  TROPONIN I (HIGH SENSITIVITY)    EKG None  Radiology DG Chest 2 View  Result Date: 08/28/2021 CLINICAL DATA:  Chest pain EXAM: CHEST - 2 VIEW COMPARISON:  01/27/2019 FINDINGS: Heart and mediastinal contours are within normal limits. No focal opacities or effusions. No acute bony abnormality. IMPRESSION: No active cardiopulmonary disease. Electronically Signed   By: Rolm Baptise M.D.   On: 08/28/2021 11:27    Procedures Procedures   Medications Ordered in ED Medications  amLODipine (NORVASC)  tablet 10 mg (has no administration in time range)    ED Course  I have reviewed the triage vital signs and the nursing notes.  Pertinent labs & imaging results that were available during my care of the patient were reviewed by me and considered in my medical decision making (see chart for details).    MDM Rules/Calculators/A&P                         {Remember to document critical care time when appropriate  60 year old male presenting with hypertension.  Blood pressure of 164/107 on arrival, 179/101 here in the ED.  He has been in ER for 9 hours and has remained asymptomatic.  He is well-appearing on arrival.  Physical exam unremarkable.  Labs ordered in triage, reviewed by me.  CBC and BMP unremarkable, normal renal function.  Delta troponin negative.  Chest x-ray unremarkable.  EKG without ischemic changes.  Low suspicion for hypertensive urgency/emergency.  Low suspicion for ACS.  Patient was given an additional dose of amlodipine here, will go up to 10 mg of Norvasc twice daily.  Encourage strict PCP follow-up.  We discussed return precautions.  He voiced her standing and is agreeable.  Stable for discharge  Case discussed w/ Dr. Vanita Panda who is agreeable to the above plan and disposition  Final Clinical Impression(s) / ED Diagnoses Final diagnoses:  Asymptomatic hypertension    Rx / DC Orders ED Discharge Orders          Ordered    amLODipine (NORVASC) 10 MG tablet  2 times daily        08/28/21 1946             Garald Balding, PA-C  08/28/21 1959    Carmin Muskrat, MD 08/29/21 1743

## 2021-08-28 NOTE — Telephone Encounter (Signed)
PLEASE NOTE: All timestamps contained within this report are represented as [place] Standard Time. CONFIDENTIALTY NOTICE: This fax transmission is intended only for the addressee. It contains information that is legally privileged, confidential or otherwise protected from use or disclosure. If you are not the intended recipient, you are strictly prohibited from reviewing, disclosing, copying using or disseminating any of this information or taking any action in reliance on or regarding this information. If you have received this fax in error, please notify us immediately by telephone so that we can arrange for its return to [place]. Phone: [phone], Toll-Free: [phone], Fax: [phone] Page: 1 of 2 Call Id: [id number] [place] Night - Client TELEPHONE ADVICE RECORD [facility] Patient Name: ROURKE KASE Gender: Male DOB: Oct 16, 1961 Age: 60 Y 44 M Return Phone Number: [phone] (Primary) Address: City/ State/ Zip: [place]  [address] Client [place] Night - Client Client Site [place] Physician Renford Dills - MD Contact Type Call Who Is Calling Patient / Member / Family / Caregiver Call Type Triage / Clinical Relationship To Patient Self Return Phone Number [phone] (Primary) Chief Complaint Blood Pressure High Reason for Call Symptomatic / Request for Health Information Initial Comment PT was seen last week for elevated BP, still having high BP at 172/90, PT also has a bad taste in his mouth. Translation No Nurse Assessment Nurse: Kirk Ruths, RN, [place] [facility] Date/Time (Eastern Time): 08/28/2021 9:03:01 AM Confirm and document reason for call. If symptomatic, describe symptoms. ---Caller states he has been having elevated b/p slight chest pain no shortness of breath no headache no swelling waiting for appt for colonoscopy bad taste in mouth kind of an iron tste not eating well abd  is distended no abd pain Does the patient have any new or worsening symptoms? ---Yes Will a triage be completed? ---Yes Related visit to physician within the last 2 weeks? ---Yes Does the PT have any chronic conditions? (i.e. diabetes, asthma, this includes High risk factors for pregnancy, etc.) ---Yes List chronic conditions. ---htn cholesterol gerd Is this a behavioral health or substance abuse call? ---No Guidelines Guideline Title Affirmed Question Affirmed Notes Nurse Date/Time (Eastern Time) Blood Pressure - High [id number] Systolic BP >= 0000000 OR Diastolic >= [id number] AND A999333 cardiac or neurologic symptoms (e.g., chest pain, difficulty breathing, unsteady gait, blurred vision) Kirk Ruths, RN, [facility] 08/28/2021 9:05:29 AM PLEASE NOTE: All timestamps contained within this report are represented as [place] Standard Time. CONFIDENTIALTY NOTICE: This fax transmission is intended only for the addressee. It contains information that is legally privileged, confidential or otherwise protected from use or disclosure. If you are not the intended recipient, you are strictly prohibited from reviewing, disclosing, copying using or disseminating any of this information or taking any action in reliance on or regarding this information. If you have received this fax in error, please notify us immediately by telephone so that we can arrange for its return to [place]. Phone: [phone], Toll-Free: [phone], Fax: [phone] Page: 2 of 2 Call Id: [id number] [place]. Time Eilene Ghazi Time) Disposition Final User 08/28/2021 9:07:31 AM Go to ED Now Yes Kirk Ruths, RN, [facility] Caller Disagree/Comply Comply Caller Understands Yes PreDisposition InappropriateToAsk Care Advice Given Per Guideline GO TO ED NOW: * You need to be seen in the Emergency Department. * Go to the ED at ___________ [place] now. Drive carefully. NOTE TO TRIAGER - DRIVING: * Another adult should drive. * Patient should not delay going to the  emergency  department. CALL EMS 911 IF: * If immediate transportation is not available via car, rideshare (e.g., Lyft, Uber), or taxi, then the patient should be instructed to call EMS-911. * Passes out or faints * Becomes confused * Becomes too weak to stand * You become worse CARE ADVICE given per High Blood Pressure (Adult) guideline. Referrals [facility] - ED

## 2021-08-28 NOTE — Telephone Encounter (Signed)
Patient is currently at [facility] ED.

## 2021-08-28 NOTE — ED Triage Notes (Signed)
Pt states his BP has been [id number] systolic this week. His MD told him if it stayed over 150's he needed to go to hospital. Pt also states he had a brief episode of left sided chest pain yesterday and tingling into his left hand.

## 2021-08-28 NOTE — Discharge Instructions (Addendum)
You were evaluated in the Emergency Department and after careful evaluation, we did not find any emergent condition requiring admission or further testing in the hospital.  As discussed, we will increase your amlodipine up to twice daily.  Please take this and follow-up with your primary care doctor within a week.  Please return to the Emergency Department if you experience any worsening of your condition including headache, blurry vision, nausea, vomiting, abdominal pain, chest pain, shortness of breath, or any other new or concerning symptoms.  Thank you for allowing [place] to be a part of your care.

## 2021-08-28 NOTE — Telephone Encounter (Signed)
Noted.  Thanks.  Will await ER report.   

## 2021-08-28 NOTE — ED Notes (Signed)
Pt AxO x4 with a GCS of 15. Denies HTN sx. Denies pain. Ambulatory at d/c and steady on feet.

## 2021-08-29 ENCOUNTER — Other Ambulatory Visit: Payer: Self-pay | Admitting: Family Medicine

## 2021-08-29 NOTE — Telephone Encounter (Signed)
Please get update on patient re: BP and clarify current dose of amlodipine after ER eval.  Thanks.

## 2021-08-31 NOTE — Telephone Encounter (Signed)
Patient called back and stated he is doing well. He is taking amlodipine 10 mg tablets BID. His BP yesterday was 128/83 and today was 133/87.

## 2021-08-31 NOTE — Telephone Encounter (Signed)
LMTCB

## 2021-09-02 MED ORDER — AMLODIPINE BESYLATE 10 MG PO TABS: 10.0000 mg | ORAL_TABLET | Freq: Every day | ORAL | Status: DC

## 2021-09-02 NOTE — Telephone Encounter (Signed)
Please check with patient.  Thank you for clarifying this.  I am glad his blood pressure is better.  10 mg of amlodipine is the typical maximum dose per day, meaning we should probably change him from 10 mg twice a day down to 10 mg once a day and add on a second medication.  I realize he had a cough with lisinopril but he should be able to tolerate losartan.  I would add on losartan 50 mg a day.  When he gets the prescription, cut amlodipine back to 10 mg a day.  We may have to increase the losartan later on.  Please have him update me about his blood pressure in about 1 week.  I did not yet send the prescription for losartan because I wanted you to be able to talk to him first.  I pended the prescription below.  Thanks.

## 2021-09-02 NOTE — Addendum Note (Signed)
Addended by: Tonia Ghent on: 09/02/2021 11:30 AM   Modules accepted: Orders

## 2021-09-04 MED ORDER — LOSARTAN POTASSIUM 50 MG PO TABS: 50.0000 mg | ORAL_TABLET | Freq: Every day | ORAL | 3 refills | Status: DC

## 2021-09-04 NOTE — Addendum Note (Signed)
Addended by: Tonia Ghent on: 09/04/2021 09:05 AM   Modules accepted: Orders

## 2021-09-04 NOTE — Telephone Encounter (Addendum)
Noted.  Sent.  Thanks. 

## 2021-09-04 NOTE — Telephone Encounter (Signed)
Spoke with patient about rx changes. He is okay with cutting amlodipine back down to 10 mg QD and starting losartan. Wants sent to the [facility] dr. Durward Fortes patient to call back next week with BP readings.

## 2021-09-06 ENCOUNTER — Telehealth: Payer: Self-pay | Admitting: Family Medicine

## 2021-09-06 NOTE — Telephone Encounter (Signed)
Patient notified he can take them together or separate if he desired.

## 2021-09-06 NOTE — Telephone Encounter (Signed)
Pt requesting call from Janett Billow to discuss medication giving at hospital. Please call pt he has been scheduled for a hospital f/u

## 2021-09-06 NOTE — Telephone Encounter (Signed)
Should be fine to take them together but he can space them out if desired.  Thanks.

## 2021-09-06 NOTE — Telephone Encounter (Signed)
Patient wants to know if it is okay to take the losartan and the amlodipine together or if he needs to space them out? Patient usually takes all his meds in the am and just wants to be sure this is okay.

## 2021-09-06 NOTE — Telephone Encounter (Signed)
Pt called stating

## 2021-09-14 ENCOUNTER — Encounter: Payer: Self-pay | Admitting: Family Medicine

## 2021-09-14 ENCOUNTER — Ambulatory Visit: Payer: 59 | Admitting: Family Medicine

## 2021-09-14 ENCOUNTER — Other Ambulatory Visit: Payer: Self-pay

## 2021-09-14 VITALS — BP 142/78 | HR 72 | Temp 98.6°F | Ht 71.0 in | Wt 263.0 lb

## 2021-09-14 DIAGNOSIS — Z23 Encounter for immunization: Secondary | ICD-10-CM

## 2021-09-14 DIAGNOSIS — I10 Essential (primary) hypertension: Secondary | ICD-10-CM

## 2021-09-14 LAB — BASIC METABOLIC PANEL
BUN: 14 mg/dL (ref 6–23)
CO2: 28 mEq/L (ref 19–32)
Calcium: 9.4 mg/dL (ref 8.4–10.5)
Chloride: 103 mEq/L (ref 96–112)
Creatinine, Ser: 1.02 mg/dL (ref 0.40–1.50)
GFR: 79.95 mL/min (ref >60.00)
Glucose, Bld: 91 mg/dL (ref 70–99)
Potassium: 4.2 mEq/L (ref 3.5–5.1)
Sodium: 138 mEq/L (ref 135–145)

## 2021-09-14 MED ORDER — NAPROXEN 500 MG PO TABS: 500.0000 mg | ORAL_TABLET | Freq: Every day | ORAL | Status: DC | PRN

## 2021-09-14 NOTE — Progress Notes (Signed)
This visit occurred during the SARS-CoV-2 public health emergency.  Safety protocols were in place, including screening questions prior to the visit, additional usage of staff PPE, and extensive cleaning of exam room while observing appropriate contact time as indicated for disinfecting solutions.  He had sig BP elevation after last OV, went to ER.  He was advised to double his amlodipine.  Blood pressure has been controlled in the meantime.  We called him about cutting back on his amlodipine and adding ARB.  Hypertension:    Using medication without problems or lightheadedness: yes Chest pain with exertion:no Edema:no Short of breath:no Average home BPs: 150/90 here today, checked last night and BP was 139/87.  Prev was was 126/77 and 136/77.   He feels better separating amlodipine and ARB.  He felt nauseated taking them together but that resolved with dose separation.    ARB cautions d/w pt.  Has taking naproxen QD prn about 2 times a week with food.  D/w pt.  BMET pending.    Meds, vitals, and allergies reviewed.   ROS: Per HPI unless specifically indicated in ROS section   GEN: nad, alert and oriented HEENT: ncat NECK: supple w/o LA CV: rrr.   PULM: ctab, no inc wob ABD: soft, +bs EXT: no edema SKIN: well perfused.

## 2021-09-14 NOTE — Patient Instructions (Signed)
Go to the lab on the way out.   If you have mychart we'll likely use that to update you.    Take care.  Glad to see you. Update me as needed, if your BP is persistently >140/>90.

## 2021-09-16 NOTE — Assessment & Plan Note (Signed)
150/90 here today, checked last night and BP was 139/87.  Prev was was 126/77 and 136/77.   He feels better separating amlodipine and ARB.  He felt nauseated taking them together but that resolved with dose separation.    ARB cautions d/w pt.  Has taking naproxen QD prn about 2 times a week with food.  D/w pt.  BMET pending.  See notes on labs I asked him to update me as needed, if his BP is persistently >140/>90.

## 2021-10-26 ENCOUNTER — Other Ambulatory Visit: Payer: Self-pay | Admitting: *Deleted

## 2021-10-26 DIAGNOSIS — J9859 Other diseases of mediastinum, not elsewhere classified: Secondary | ICD-10-CM

## 2021-10-31 ENCOUNTER — Other Ambulatory Visit: Payer: Self-pay

## 2021-10-31 ENCOUNTER — Ambulatory Visit: Payer: 59 | Admitting: Family Medicine

## 2021-10-31 ENCOUNTER — Encounter: Payer: Self-pay | Admitting: Family Medicine

## 2021-10-31 ENCOUNTER — Encounter: Payer: Self-pay | Admitting: Nurse Practitioner

## 2021-10-31 VITALS — BP 160/96 | HR 71 | Temp 97.9°F | Ht 71.0 in | Wt 266.4 lb

## 2021-10-31 DIAGNOSIS — D126 Benign neoplasm of colon, unspecified: Secondary | ICD-10-CM

## 2021-10-31 DIAGNOSIS — D500 Iron deficiency anemia secondary to blood loss (chronic): Secondary | ICD-10-CM | POA: Diagnosis not present

## 2021-10-31 DIAGNOSIS — K5900 Constipation, unspecified: Secondary | ICD-10-CM | POA: Diagnosis not present

## 2021-10-31 DIAGNOSIS — K625 Hemorrhage of anus and rectum: Secondary | ICD-10-CM

## 2021-10-31 MED ORDER — HYDROCORTISONE ACETATE 25 MG RE SUPP: 25.0000 mg | Freq: Two times a day (BID) | RECTAL | 1 refills | Status: DC

## 2021-10-31 NOTE — Progress Notes (Signed)
Daniel Kimmey T. Lucan Riner, MD, [place] at [facility] [place], [address]  Phone: [phone]  FAX: [phone]  [name] - 60 y.o. male  MRN [id number]  Date of Birth: 04-Aug-1961  Date: 10/31/2021  PCP: Tonia Ghent, MD  Referral: Tonia Ghent, MD  Chief Complaint  Patient presents with   Blood In Stools   Constipation    This visit occurred during the SARS-CoV-2 public health emergency.  Safety protocols were in place, including screening questions prior to the visit, additional usage of staff PPE, and extensive cleaning of exam room while observing appropriate contact time as indicated for disinfecting solutions.   Subjective:   Daniel [name] is a 60 y.o. very pleasant male patient with Body mass index is 37.15 kg/m. who presents with the following:  Constipation with blood in stool.  He has never had anything like this that he can recall.  He is not sure if he is ever had hemorrhoids in the past.  No hemorrhoids were noted on his 2015 colonoscopy.  He had bright red blood on the toilet play part of this morning, and he has also noticed this other times in the week.  H/o large adenoma resected in 2013. Last colonoscopy in 2015. 02/2019, colon recall.  This AM.  A lot of blood on the toilet paper.  Got constipated earlier in the week.  This is been painful and painful with he has had a bowel movement this week.  Will sometimes have const, but this is worse compared to baseline. No black or tar color.  + blood on exam  In 2013 he did have a partial colectomy with anastomosis done by Dr. Dalbert Batman for a large adenomatous polyp.  Review of Systems is noted in the HPI, as appropriate  Objective:   BP (!) 160/96   Pulse 71   Temp 97.9 F (36.6 C) (Temporal)   Ht 5\' 11"  (1.803 m)   Wt 266 lb 6 oz (120.8 kg)   SpO2 96%   BMI 37.15 kg/m   GEN: No acute distress; alert,appropriate. PULM:  Breathing comfortably in no respiratory distress PSYCH: Normally interactive.  ABD: S, minimally tender on palpation, ND, + BS, No rebound, No HSM  Rectal shows gross blood mixed with stool, no external hemorrhoids are visualized, believe I do feel a superior small hemorrhoid.  When the glove was removed I did note some gross blood.  Laboratory and Imaging Data:  Assessment and Plan:     ICD-10-CM   1. BRBPR (bright red blood per rectum)  K62.5 Ambulatory referral to Gastroenterology    CBC with Differential/Platelet    IBC + Ferritin    2. Constipation, unspecified constipation type  K59.00 Ambulatory referral to Gastroenterology    3. Blood loss anemia  D50.0 Ambulatory referral to Gastroenterology    CBC with Differential/Platelet    IBC + Ferritin    4. Adenomatous polyp of colon, unspecified part of colon  D12.6 Ambulatory referral to Gastroenterology     Acute lower GI bleeding. He also does have constipation right now.  Clinical history is also significant for a large adenomatous polyp that required surgical partial colectomy in 2013.  His last colonoscopy was in 2015, and he is currently overdue.  Check CBC.  I do think that he needs to be seen by GI to further evaluate.  We discussed that 1 cannot assume that this is hemorrhoids, and colorectal cancer should be  evaluated as a possibility along with other lower GI pathology.  Patient Instructions  CONSTIPATION Difficult, uncomfortable, infrequent BM  1. Warm prune juice, hot water, tea, coffee, apple juice 2. Prevention: drink 8 glasses water daily, FIBER (raw fruit, veggies, bran cereal, whole grains), regular exercise 3. Bulk formers like Metamucil (psyllium), Citrucel (methylcellulose) usually help 4. Stool softeners (Docusate) occaisionally OK. 5. Occaisional over the counter Miralax usually safe 6. Overuse of stimulant  laxatives (Ex-Lax, Mag Citrate, Dulcolax, etc.) can be habit forming   SIMPLE CONSTIPATION  FORMULA: DOCUSATE 2 TABLETS TWICE A DAY MIRALAX. START WITH 1 DOSE A DAY, THEN INCREASE TO TWICE A DAY, THREE TIMES A DAY, ETC IF NO BOWEL MOVEMENT.    Meds ordered this encounter  Medications   hydrocortisone (ANUSOL-HC) 25 MG suppository    Sig: Place 1 suppository (25 mg total) rectally 2 (two) times daily.    Dispense:  24 suppository    Refill:  1   There are no discontinued medications. Orders Placed This Encounter  Procedures   CBC with Differential/Platelet   IBC + Ferritin   Ambulatory referral to Gastroenterology    Follow-up: No follow-ups on file.  Dr[employer] One speech-to-text software was used for transcription in this dictation.  Possible transcriptional errors can occur using Editor, commissioning.   Signed,  Maud Deed. Daymien Goth, MD   Outpatient Encounter Medications as of 10/31/2021  Medication Sig   amLODipine (NORVASC) 10 MG tablet Take 1 tablet (10 mg total) by mouth daily.   aspirin EC 81 MG tablet Take 81 mg by mouth daily. Swallow whole.   hydrocortisone (ANUSOL-HC) 25 MG suppository Place 1 suppository (25 mg total) rectally 2 (two) times daily.   losartan (COZAAR) 50 MG tablet Take 1 tablet (50 mg total) by mouth daily.   naproxen (NAPROSYN) 500 MG tablet Take 1 tablet (500 mg total) by mouth daily as needed.   pantoprazole (PROTONIX) 40 MG tablet Take 1 tablet (40 mg total) by mouth daily.   pravastatin (PRAVACHOL) 40 MG tablet Take 1 tablet (40 mg total) by mouth daily.   No facility-administered encounter medications on file as of 10/31/2021.

## 2021-10-31 NOTE — Patient Instructions (Signed)
CONSTIPATION Difficult, uncomfortable, infrequent BM  1. Warm prune juice, hot water, tea, coffee, apple juice 2. Prevention: drink 8 glasses water daily, FIBER (raw fruit, veggies, bran cereal, whole grains), regular exercise 3. Bulk formers like Metamucil (psyllium), Citrucel (methylcellulose) usually help 4. Stool softeners (Docusate) occaisionally OK. 5. Occaisional over the counter Miralax usually safe 6. Overuse of stimulant  laxatives (Ex-Lax, Mag Citrate, Dulcolax, etc.) can be habit forming   SIMPLE CONSTIPATION FORMULA: DOCUSATE 2 TABLETS TWICE A DAY MIRALAX. START WITH 1 DOSE A DAY, THEN INCREASE TO TWICE A DAY, THREE TIMES A DAY, ETC IF NO BOWEL MOVEMENT.

## 2021-11-01 LAB — CBC WITH DIFFERENTIAL/PLATELET
Basophils Absolute: 0.1 10*3/uL (ref 0.0–0.1)
Basophils Relative: 1.3 % (ref 0.0–3.0)
Eosinophils Absolute: 0.1 10*3/uL (ref 0.0–0.7)
Eosinophils Relative: 1.7 % (ref 0.0–5.0)
HCT: 42.8 % (ref 39.0–52.0)
Hemoglobin: 13.8 g/dL (ref 13.0–17.0)
Lymphocytes Relative: 36.7 % (ref 12.0–46.0)
Lymphs Abs: 2.5 10*3/uL (ref 0.7–4.0)
MCHC: 32.2 g/dL (ref 30.0–36.0)
MCV: 80.9 fl (ref 78.0–100.0)
Monocytes Absolute: 0.6 10*3/uL (ref 0.1–1.0)
Monocytes Relative: 8.5 % (ref 3.0–12.0)
Neutro Abs: 3.5 10*3/uL (ref 1.4–7.7)
Neutrophils Relative %: 51.8 % (ref 43.0–77.0)
Platelets: 238 10*3/uL (ref 150.0–400.0)
RBC: 5.29 Mil/uL (ref 4.22–5.81)
RDW: 15.1 % (ref 11.5–15.5)
WBC: 6.8 10*3/uL (ref 4.0–10.5)

## 2021-11-01 LAB — IBC + FERRITIN
Ferritin: 77.2 ng/mL (ref 22.0–322.0)
Iron: 61 ug/dL (ref 42–165)
Saturation Ratios: 14.9 % — ABNORMAL LOW (ref 20.0–50.0)
TIBC: 408.8 ug/dL (ref 250.0–450.0)
Transferrin: 292 mg/dL (ref 212.0–360.0)

## 2021-11-16 ENCOUNTER — Ambulatory Visit: Payer: 59 | Admitting: Nurse Practitioner

## 2021-11-16 ENCOUNTER — Encounter: Payer: Self-pay | Admitting: Nurse Practitioner

## 2021-11-16 VITALS — BP 158/92 | HR 76 | Ht 71.0 in | Wt 267.2 lb

## 2021-11-16 DIAGNOSIS — Z86010 Personal history of colonic polyps: Secondary | ICD-10-CM | POA: Diagnosis not present

## 2021-11-16 NOTE — Patient Instructions (Signed)
If you are age 60 or younger, your body mass index should be between 19-25. Your Body mass index is 37.27 kg/m. If this is out of the aformentioned range listed, please consider follow up with your Primary Care Provider.   The [facility] providers would like to encourage you to use [facility] to communicate with providers for non-urgent requests or questions.  Due to long hold times on the telephone, sending your provider a message by [facility] may be faster and more efficient way to get a response. Please allow 48 business hours for a response.  Please remember that this is for non-urgent requests/questions.  PROCEDURES: You have been scheduled for a colonoscopy. Please follow the written instructions given to you at your visit today. If you use inhalers (even only as needed), please bring them with you on the day of your procedure.  Start fiber as recommended by your primary doctor.  It was great seeing you today! Thank you for entrusting me with your care and choosing [facility].  Tye Savoy, NP

## 2021-11-16 NOTE — Progress Notes (Signed)
ASSESSMENT AND PLAN    # 60 year old male with history of adenomatous colon polyps.  He had a large TA in 2013, status post partial colectomy.  No polyps or cancers on last surveillance colonoscopy in 2015.  He is overdue for recommended 5-year interval colonoscopy (he had to cancel due to getting COVID) --Schedule for a colonoscopy. The risks and benefits of colonoscopy with possible polypectomy / biopsies were discussed and the patient agrees to proceed.   # Recent episode of rectal bleeding in setting of acute constipation.  Bleeding was probably secondary to hemorrhoids and now resolved after course of Anusol suppositories.  Additionally, no further constipation --Recommend patient try fiber supplement as recommended by PCP.  Okay to discontinue if unable to tolerate secondary to bloating, gas  # Acid reflux with occasional heartburn. Avoids spicy and greasy foods. Takes Pantoprazole as needed.  --Advised to take anti-reflux medication 30 minutes before meal(s) --Anti-reflux measures discussed including avoidance of trigger foods and evening meals / bedtime snacks. If able, elevate head of bed 6-8 inches. If unable to elevate the head of the bed consider a wedge pillow.   Weight reduction / maintaining a healthy BMI) as increased abdominal girth is associated with reflux.    HISTORY OF PRESENT ILLNESS     Chief Complaint : history of colon polyps, recent constipation / episode of rectal bleeding  Daniel [name] is a 60 y.o. male with a past medical history significant for acid reflux, adenomatous colon polyps ( large TA in 2013 s/p partial colectomy), prostate cancer s/p prostatectomy 2020, hyperlipidemia, hypertension, appendectomy.  See PMH below for any additional history.    Patient last seen by Dr.Gessner at time of last surveillance colonoscopy in 2015.  He had a large colon adenoma resected in 2013, no recurrent polyps on 2015 colonoscopy.  He was due for a 5-year polyp  surveillance colonoscopy in March 2020 but patient says he had to cancel due to getting COVID.    Patient saw PCP 10/31/2021.  He had an isolated episode of constipation with rectal bleeding earlier in the week.  Anusol suppositories were prescribed .  Fiber recommended along with stool softeners to treat constipation .  CBC showed a normal hemoglobin of 13.8 .  MCV was low normal at 80.9 . He is referred PCP for rectal bleeding / history of colon polyps.  Patient is unsure why he had isolated episode of of constipation. Normally he has no bowels with his bowel movements. Bowel movements are back to normal.  He hasn't yet started fiber supplements. He did complete course of Anusol Supp and no bleeding since.   Patient is followed by Dr. Kipp Brood for a mediastinal mass. He has been getting  surveillance scan but apparently there hasn't been any change in size of mass.Marland KitchenNext chest CT scheduled for later this month    PREVIOUS GI EVALUATIONS:   2014 EGD for dysphagia and chest pain --GE J stenosis, status post dilation with 54 French Maloney dilator --Esophageal biopsies unremarkable  February 2015 - most recent polyp surveillance colonoscopy --Excellent prep.  Extent of exam to the surgical anastomosis.  --Hemorrhoids, otherwise normal.  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  (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[employer], broadcasting/film/video)   Prostate cancer ([place])    Villous adenoma of right colon 03/12/2012   Laparoscopic right colectomy 02/25/2012      Past Surgical History:  Procedure Laterality Date   APPENDECTOMY  2013   COLON RESECTION  02/25/2012   Procedure: LAPAROSCOPIC RIGHT COLON RESECTION;  Surgeon: Adin Hector, MD;  Location: [facility];  Service: General;  Laterality: Right;  laparoscopic assisted right colectomy   COLONOSCOPY  11/21/11   LAPAROSCOPIC RIGHT COLON RESECTION  2013   adenomatous polyp   LEFT HEART CATH AND CORONARY ANGIOGRAPHY N/A 01/28/2019   Procedure: LEFT HEART CATH AND CORONARY ANGIOGRAPHY;  Surgeon: Leonie Man, MD;  Location: [place] [facility];  Service: Cardiovascular;  Laterality: N/A;   LYMPHADENECTOMY Bilateral 04/22/2019   Procedure: LYMPHADENECTOMY, PELVIC;  Surgeon: [name], MD;  Location: [facility];  Service: Urology;  Laterality: Bilateral;   PROSTATE BIOPSY     ROBOT ASSISTED LAPAROSCOPIC RADICAL PROSTATECTOMY N/A 04/22/2019   Procedure: XI ROBOTIC ASSISTED LAPAROSCOPIC RADICAL PROSTATECTOMY LEVEL 3;  Surgeon: [name], MD;  Location: [facility];  Service: Urology;  Laterality: N/A;  ONLY NEEDS 210 MIN FOR ALL PROCEDURES   Family History  Problem Relation Age of Onset   Heart disease Mother 63       MI, died at age 45   Hypertension Mother    Coronary artery disease Mother    Alcohol abuse Father        cirrhoisis of liver-- ETOH abuse   Heart disease Brother    Colon cancer Neg Hx    Prostate cancer Neg Hx    Esophageal cancer Neg Hx    Rectal cancer Neg Hx    Stomach cancer Neg Hx    Social History   Tobacco Use   Smoking status: Never   Smokeless tobacco: Never  Vaping Use    Vaping Use: Never used  Substance Use Topics   Alcohol use: No   Drug use: No   Current Outpatient Medications  Medication Sig Dispense Refill   amLODipine (NORVASC) 10 MG tablet Take 1 tablet (10 mg total) by mouth daily.     aspirin EC 81 MG tablet Take 81 mg by mouth daily. Swallow whole.     hydrocortisone (ANUSOL-HC) 25 MG suppository Place 1 suppository (25 mg total) rectally 2 (two) times daily. 24 suppository 1   losartan (COZAAR) 50 MG tablet Take 1 tablet (50 mg total) by mouth daily. 90 tablet 3   naproxen (NAPROSYN) 500 MG tablet Take 1 tablet (500 mg total) by mouth daily as needed.     pantoprazole (PROTONIX) 40 MG tablet Take 1 tablet (40 mg total) by mouth daily. 90 tablet 3   pravastatin (PRAVACHOL) 40 MG tablet Take  1 tablet (40 mg total) by mouth daily. 90 tablet 3   No current facility-administered medications for this visit.   Allergies  Allergen Reactions   Oxycodone Other (See Comments)    syncope   Lisinopril Cough     Review of Systems: Positive for back pain, cramps, urine leakage.  All other systems reviewed and negative except where noted in HPI.    PHYSICAL EXAM :    Wt Readings from Last 3 Encounters:  11/16/21 267 lb 4 oz (121.2 kg)  10/31/21 266 lb 6 oz (120.8 kg)  09/14/21 263 lb (119.3 kg)    Ht 5\' 11"  (1.803 m)   Wt 267 lb 4 oz (121.2 kg)   BMI 37.27 kg/m  Constitutional:  Generally well appearing male in no acute distress. Psychiatric: Pleasant. Normal mood and affect. Behavior is normal. EENT: Pupils normal.  Conjunctivae are normal. No scleral icterus. Neck supple.  Cardiovascular: Normal rate, regular rhythm. No edema Pulmonary/chest: Effort normal and breath sounds normal. No wheezing, rales or rhonchi. Abdominal: Soft, nondistended, nontender. Bowel sounds active throughout. There are no masses palpable. No hepatomegaly. Neurological: Alert and oriented to person place and time. Skin: Skin is warm and dry. No rashes  noted.  Tye Savoy, NP  11/16/2021, 9:58 AM  Cc:  Referring Provider Donella Stade, MD

## 2021-11-28 ENCOUNTER — Ambulatory Visit (AMBULATORY_SURGERY_CENTER): Payer: 59 | Admitting: Internal Medicine

## 2021-11-28 ENCOUNTER — Encounter: Payer: Self-pay | Admitting: Internal Medicine

## 2021-11-28 VITALS — BP 156/73 | HR 70 | Temp 98.4°F | Resp 16 | Ht 71.0 in | Wt 267.0 lb

## 2021-11-28 DIAGNOSIS — Z86010 Personal history of colonic polyps: Secondary | ICD-10-CM

## 2021-11-28 DIAGNOSIS — D125 Benign neoplasm of sigmoid colon: Secondary | ICD-10-CM

## 2021-11-28 DIAGNOSIS — D123 Benign neoplasm of transverse colon: Secondary | ICD-10-CM | POA: Diagnosis not present

## 2021-11-28 MED ORDER — SODIUM CHLORIDE 0.9 % IV SOLN: 500.0000 mL | Freq: Once | INTRAVENOUS | Status: DC

## 2021-11-28 NOTE — Progress Notes (Signed)
C.W. vital signs. 

## 2021-11-28 NOTE — Progress Notes (Signed)
Called to room to assist during endoscopic procedure.  Patient ID and intended procedure confirmed with present staff. Received instructions for my participation in the procedure from the performing physician.  

## 2021-11-28 NOTE — Op Note (Signed)
[place] Patient Name: Daniel [name] Procedure Date: 11/28/2021 3:45 PM MRN: [id number] Endoscopist: Gatha Mayer , MD Age: 61 Referring MD:  Date of Birth: 1961/02/22 Gender: Male Account #: [id number] Procedure:                Colonoscopy Indications:              Surveillance: Personal history of adenomatous                            polyps on last colonoscopy > 5 years ago Medicines:                Propofol per Anesthesia, Monitored Anesthesia Care Procedure:                Pre-Anesthesia Assessment:                           - Prior to the procedure, a History and Physical                            was performed, and patient medications and                            allergies were reviewed. The patient's tolerance of                            previous anesthesia was also reviewed. The risks                            and benefits of the procedure and the sedation                            options and risks were discussed with the patient.                            All questions were answered, and informed consent                            was obtained. Prior Anticoagulants: The patient has                            taken no previous anticoagulant or antiplatelet                            agents. ASA Grade Assessment: II - A patient with                            mild systemic disease. After reviewing the risks                            and benefits, the patient was deemed in                            satisfactory condition to undergo the procedure.  After obtaining informed consent, the colonoscope                            was passed under direct vision. Throughout the                            procedure, the patient's blood pressure, pulse, and                            oxygen saturations were monitored continuously. The                            Colonoscope was introduced through the anus and                             advanced to the the ileocolonic anastomosis. The                            colonoscopy was performed without difficulty. The                            patient tolerated the procedure well. The quality                            of the bowel preparation was good. The bowel                            preparation used was Miralax via split dose                            instruction. Scope In: 4:01:56 PM Scope Out: 4:22:50 PM Scope Withdrawal Time: 0 hours 15 minutes 25 seconds  Total Procedure Duration: 0 hours 20 minutes 54 seconds  Findings:                 The digital rectal exam findings include surgically                            absent prostate.                           There was evidence of a prior end-to-side                            ileo-colonic anastomosis in the transverse colon.                            This was patent and was characterized by healthy                            appearing mucosa.                           Five sessile polyps were found in the sigmoid colon  and transverse colon. The polyps were 1 to 6 mm in                            size. These polyps were removed with a cold snare.                            Resection and retrieval were complete. Verification                            of patient identification for the specimen was                            done. Estimated blood loss was minimal.                           The exam was otherwise without abnormality on                            direct and retroflexion views. Complications:            No immediate complications. Estimated Blood Loss:     Estimated blood loss was minimal. Impression:               - A surgically absent prostate found on digital                            rectal exam.                           - Patent end-to-side ileo-colonic anastomosis,                            characterized by healthy appearing mucosa.                           - Five 1  to 6 mm polyps in the sigmoid colon and in                            the transverse colon, removed with a cold snare.                            Resected and retrieved.                           - The examination was otherwise normal on direct                            and retroflexion views.                           - Personal history of colonic polyps.                           2012 3 tubular adenomas - 2 removed (largest 1.3  cm) and another large cecal adenoma not removed.                            Surgical referral re: abnormal appendix - right                            hemicolectomy Dr. Dalbert Batman 02/2012 - 4 cm cecal                            adenoma, benign appendix                           01/17/2014 no polyps Recommendation:           - Patient has a contact number available for                            emergencies. The signs and symptoms of potential                            delayed complications were discussed with the                            patient. Return to normal activities tomorrow.                            Written discharge instructions were provided to the                            patient.                           - Resume previous diet.                           - Continue present medications.                           - Repeat colonoscopy is recommended for                            surveillance. The colonoscopy date will be                            determined after pathology results from today's                            exam become available for review. Gatha Mayer, MD 11/28/2021 4:39:27 PM This report has been signed electronically.

## 2021-11-28 NOTE — Progress Notes (Signed)
Pt's states no medical or surgical changes since previsit or office visit. 

## 2021-11-28 NOTE — Progress Notes (Signed)
Report to PACU, RN, vss, BBS= Clear.  

## 2021-11-28 NOTE — Patient Instructions (Addendum)
I found and removed 5 little polyps today. I will let you know pathology results and when to have another routine colonoscopy by mail and/or My Chart.  I appreciate the opportunity to care for you. Gatha Mayer, MD, [facility]  Please read handouts provided. Continue present medications. Resume previous diet.     YOU HAD AN ENDOSCOPIC PROCEDURE TODAY AT [facility]:   Refer to the procedure report that was given to you for any specific questions about what was found during the examination.  If the procedure report does not answer your questions, please call your gastroenterologist to clarify.  If you requested that your care partner not be given the details of your procedure findings, then the procedure report has been included in a sealed envelope for you to review at your convenience later.  YOU SHOULD EXPECT: Some feelings of bloating in the abdomen. Passage of more gas than usual.  Walking can help get rid of the air that was put into your GI tract during the procedure and reduce the bloating. If you had a lower endoscopy (such as a colonoscopy or flexible sigmoidoscopy) you may notice spotting of blood in your stool or on the toilet paper. If you underwent a bowel prep for your procedure, you may not have a normal bowel movement for a few days.  Please Note:  You might notice some irritation and congestion in your nose or some drainage.  This is from the oxygen used during your procedure.  There is no need for concern and it should clear up in a day or so.  SYMPTOMS TO REPORT IMMEDIATELY:  Following lower endoscopy (colonoscopy or flexible sigmoidoscopy):  Excessive amounts of blood in the stool  Significant tenderness or worsening of abdominal pains  Swelling of the abdomen that is new, acute  Fever of 100F or higher   For urgent or emergent issues, a gastroenterologist can be reached at any hour by calling [phone]. Do not use MyChart messaging for urgent  concerns.    DIET:  We do recommend a small meal at first, but then you may proceed to your regular diet.  Drink plenty of fluids but you should avoid alcoholic beverages for 24 hours.  ACTIVITY:  You should plan to take it easy for the rest of today and you should NOT DRIVE or use heavy machinery until tomorrow (because of the sedation medicines used during the test).    FOLLOW UP: Our staff will call the number listed on your records 48-72 hours following your procedure to check on you and address any questions or concerns that you may have regarding the information given to you following your procedure. If we do not reach you, we will leave a message.  We will attempt to reach you two times.  During this call, we will ask if you have developed any symptoms of COVID 19. If you develop any symptoms (ie: fever, flu-like symptoms, shortness of breath, cough etc.) before then, please call [phone].  If you test positive for Covid 19 in the 2 weeks post procedure, please call and report this information to [place].    If any biopsies were taken you will be contacted by phone or by letter within the next 1-3 weeks.  Please call us at [phone] if you have not heard about the biopsies in 3 weeks.    SIGNATURES/CONFIDENTIALITY: You and/or your care partner have signed paperwork which will be entered into your electronic medical record.  These  signatures attest to the fact that that the information above on your After Visit Summary has been reviewed and is understood.  Full responsibility of the confidentiality of this discharge information lies with you and/or your care-partner.

## 2021-11-28 NOTE — Progress Notes (Signed)
History and Physical Interval Note:  11/28/2021 3:51 PM  Daniel [name]  has presented today for endoscopic procedure(s), with the diagnosis of  Encounter Diagnosis  Name Primary?   Personal history of colonic polyps Yes  .  The various methods of evaluation and treatment have been discussed with the patient and/or family. After consideration of risks, benefits and other options for treatment, the patient has consented to  the endoscopic procedure(s).   The patient's history has been reviewed, patient examined, no change in status, stable for endoscopic procedure(s).  I have reviewed the patient's chart and labs.  Questions were answered to the patient's satisfaction.     Gatha Mayer, MD, Daniel [name]

## 2021-11-30 ENCOUNTER — Other Ambulatory Visit: Payer: Self-pay

## 2021-11-30 ENCOUNTER — Ambulatory Visit (INDEPENDENT_AMBULATORY_CARE_PROVIDER_SITE_OTHER): Payer: 59 | Admitting: Thoracic Surgery (Cardiothoracic Vascular Surgery)

## 2021-11-30 ENCOUNTER — Ambulatory Visit
Admission: RE | Admit: 2021-11-30 | Discharge: 2021-11-30 | Disposition: A | Discharge status: Home / Self Care | Payer: 59 | Source: Ambulatory Visit | Attending: Thoracic Surgery (Cardiothoracic Vascular Surgery) | Admitting: Thoracic Surgery (Cardiothoracic Vascular Surgery)

## 2021-11-30 ENCOUNTER — Telehealth: Payer: Self-pay | Admitting: *Deleted

## 2021-11-30 DIAGNOSIS — J9859 Other diseases of mediastinum, not elsewhere classified: Secondary | ICD-10-CM

## 2021-11-30 NOTE — Telephone Encounter (Signed)
°  Follow up Call-  Call back number 11/28/2021  Post procedure Call Back phone  # [phone]  Permission to leave phone message Yes  Some recent data might be hidden     Patient questions:  Do you have a fever, pain , or abdominal swelling? No. Pain Score  0 *  Have you tolerated food without any problems? Yes.    Have you been able to return to your normal activities? Yes.    Do you have any questions about your discharge instructions: Diet   No. Medications  No. Follow up visit  No.  Do you have questions or concerns about your Care? No.  Actions: * If pain score is 4 or above: No action needed, pain <4.  Have you developed a fever since your procedure? no  2.   Have you had an respiratory symptoms (SOB or cough) since your procedure? no  3.   Have you tested positive for COVID 19 since your procedure no  4.   Have you had any family members/close contacts diagnosed with the COVID 19 since your procedure?  no   If yes to any of these questions please route to Joylene John, RN and Joella Prince, RN

## 2021-11-30 NOTE — Telephone Encounter (Signed)
Patient returned call for his follow up, stated that he was doing great.

## 2021-11-30 NOTE — Telephone Encounter (Signed)
°  Follow up Call-  Call back number 11/28/2021  Post procedure Call Back phone  # [phone]  Permission to leave phone message Yes  Some recent data might be hidden     Patient questions: Speaking with patient and cut off?

## 2021-11-30 NOTE — Progress Notes (Signed)
°   °  [address]       [place] [address]             [phone]       Patient: Home Provider: Office Consent for Telemedicine visit obtained.  Todays visit was completed via a real-time telehealth (see specific modality noted below). The patient/authorized person provided oral consent at the time of the visit to engage in a telemedicine encounter with the present provider at [facility]. The patient/authorized person was informed of the potential benefits, limitations, and risks of telemedicine. The patient/authorized person expressed understanding that the laws that protect confidentiality also apply to telemedicine. The patient/authorized person acknowledged understanding that telemedicine does not provide emergency services and that he or she would need to call 911 or proceed to the nearest hospital for help if such a need arose.   Total time spent in the clinical discussion 10 minutes.  Telehealth Modality: Phone visit (audio only)  I had a telephone visit with Daniel [name].  He is a 60 year old gentleman who follows up for an anterior mediastinal mass.  He has undergone surveillance CT scan at a year.  I personally reviewed the CT scan.  Mediastinal fullness has decreased somewhat in size.  It appears more consistent with hyperplasia at this point.  I discussed the results with Daniel [name] and informed him that he no longer needs any more follow-up with [place].  He will call us if there are any issues.

## 2021-12-04 ENCOUNTER — Encounter: Payer: Self-pay | Admitting: Internal Medicine

## 2022-02-26 ENCOUNTER — Encounter: Payer: Self-pay | Admitting: Family Medicine

## 2022-02-26 ENCOUNTER — Ambulatory Visit: Payer: 59 | Admitting: Family Medicine

## 2022-02-26 ENCOUNTER — Other Ambulatory Visit: Payer: Self-pay

## 2022-02-26 ENCOUNTER — Ambulatory Visit (INDEPENDENT_AMBULATORY_CARE_PROVIDER_SITE_OTHER)
Admission: RE | Admit: 2022-02-26 | Discharge: 2022-02-26 | Disposition: A | Discharge status: Home / Self Care | Payer: 59 | Source: Ambulatory Visit | Attending: Family Medicine | Admitting: Family Medicine

## 2022-02-26 VITALS — BP 154/100 | HR 91 | Temp 98.7°F | Ht 71.0 in | Wt 266.0 lb

## 2022-02-26 DIAGNOSIS — R059 Cough, unspecified: Secondary | ICD-10-CM | POA: Diagnosis not present

## 2022-02-26 NOTE — Progress Notes (Signed)
This visit occurred during the SARS-CoV-2 public health emergency.  Safety protocols were in place, including screening questions prior to the visit, additional usage of staff PPE, and extensive cleaning of exam room while observing appropriate contact time as indicated for disinfecting solutions. ? ?Cough x1 week.  Cough was initially dry, then some occ sputum, and now cough is drier.  Chest is sore, left sided, potentially from coughing, some better now.  Neg covid test at home.  No FCNAVD.  No ear pain.  No ST.   He feels a "cracking" sensation occ in the upper airway but not now.  Some sternal pain with cough.  Can still take a deep breath.  No wheeze.  No hemoptysis.   ? ?Prev CT with lungs/Pleura: No pleural effusion or pneumothorax. The lungs are clear. ? ?Meds, vitals, and allergies reviewed.  ? ?ROS: Per HPI unless specifically indicated in ROS section  ? ?GEN: nad, alert and oriented ?HEENT: mucous membranes moist. OP wnl TM wnl B ?NECK: supple w/o LA, no stridor or UAN ?CV: rrr.  ?PULM: ctab, no inc wob ?ABD: soft, +bs ?EXT: no edema ?SKIN: no acute rash ?Sternum ttp  ?

## 2022-02-26 NOTE — Patient Instructions (Signed)
Go to the lab on the way out.   If you have mychart we'll likely use that to update you.    ?Likely benign irritation causing the cough and the pain.   ?Take aleve if needed with food.   ?If worse, then let me know.   ?Take care.  Glad to see you. ?

## 2022-02-27 NOTE — Assessment & Plan Note (Signed)
Cough with likely benign chest wall pain.  I suspect that he had a benign viral process that should gradually resolve.  That could have caused upper airway noise that he appreciated is "crackling". ?Take aleve if needed with food for pain. ?If worse, then he will let me know.   ?Check chest x-ray in the meantime.  Lungs are clear and still okay for outpatient follow-up. ?

## 2022-03-06 ENCOUNTER — Other Ambulatory Visit: Payer: Self-pay | Admitting: Family Medicine

## 2022-03-06 DIAGNOSIS — R059 Cough, unspecified: Secondary | ICD-10-CM

## 2022-04-03 ENCOUNTER — Ambulatory Visit (INDEPENDENT_AMBULATORY_CARE_PROVIDER_SITE_OTHER)
Admission: RE | Admit: 2022-04-03 | Discharge: 2022-04-03 | Disposition: A | Discharge status: Home / Self Care | Payer: 59 | Source: Ambulatory Visit | Attending: Family Medicine | Admitting: Family Medicine

## 2022-04-03 ENCOUNTER — Other Ambulatory Visit: Payer: 59

## 2022-04-03 DIAGNOSIS — R059 Cough, unspecified: Secondary | ICD-10-CM

## 2022-08-01 ENCOUNTER — Ambulatory Visit (INDEPENDENT_AMBULATORY_CARE_PROVIDER_SITE_OTHER)
Admission: RE | Admit: 2022-08-01 | Discharge: 2022-08-01 | Disposition: A | Discharge status: Home / Self Care | Payer: 59 | Source: Ambulatory Visit | Attending: Nurse Practitioner | Admitting: Nurse Practitioner

## 2022-08-01 ENCOUNTER — Ambulatory Visit: Payer: 59 | Admitting: Nurse Practitioner

## 2022-08-01 ENCOUNTER — Encounter: Payer: Self-pay | Admitting: Nurse Practitioner

## 2022-08-01 VITALS — BP 162/104 | HR 86 | Temp 99.1°F | Resp 12 | Ht 71.0 in | Wt 273.2 lb

## 2022-08-01 DIAGNOSIS — R051 Acute cough: Secondary | ICD-10-CM

## 2022-08-01 DIAGNOSIS — R0789 Other chest pain: Secondary | ICD-10-CM

## 2022-08-01 DIAGNOSIS — R0602 Shortness of breath: Secondary | ICD-10-CM | POA: Diagnosis not present

## 2022-08-01 DIAGNOSIS — I10 Essential (primary) hypertension: Secondary | ICD-10-CM

## 2022-08-01 LAB — COMPREHENSIVE METABOLIC PANEL
ALT: 14 U/L (ref 0–53)
AST: 15 U/L (ref 0–37)
Albumin: 4.2 g/dL (ref 3.5–5.2)
Alkaline Phosphatase: 58 U/L (ref 39–117)
BUN: 16 mg/dL (ref 6–23)
CO2: 28 mEq/L (ref 19–32)
Calcium: 9 mg/dL (ref 8.4–10.5)
Chloride: 103 mEq/L (ref 96–112)
Creatinine, Ser: 1.05 mg/dL (ref 0.40–1.50)
GFR: 76.75 mL/min (ref >60.00)
Glucose, Bld: 96 mg/dL (ref 70–99)
Potassium: 4.1 mEq/L (ref 3.5–5.1)
Sodium: 140 mEq/L (ref 135–145)
Total Bilirubin: 0.6 mg/dL (ref 0.2–1.2)
Total Protein: 7.4 g/dL (ref 6.0–8.3)

## 2022-08-01 LAB — CBC
HCT: 45.5 % (ref 39.0–52.0)
Hemoglobin: 14.6 g/dL (ref 13.0–17.0)
MCHC: 32.2 g/dL (ref 30.0–36.0)
MCV: 79.9 fl (ref 78.0–100.0)
Platelets: 216 10*3/uL (ref 150.0–400.0)
RBC: 5.69 Mil/uL (ref 4.22–5.81)
RDW: 15.3 % (ref 11.5–15.5)
WBC: 5.9 10*3/uL (ref 4.0–10.5)

## 2022-08-01 LAB — TSH: TSH: 1.4 u[IU]/mL (ref 0.35–5.50)

## 2022-08-01 NOTE — Assessment & Plan Note (Signed)
Continue over-the-counter treatment as needed.  Pending labs and chest x-ray

## 2022-08-01 NOTE — Assessment & Plan Note (Signed)
Patient's blood pressure elevated in office on initial check and at recheck.  Patient does check his blood pressure at home states last week he was within normal limits this week has been elevated likely due to its going on currently patient encouraged to check his blood pressure several times throughout the week and report back if being high if so he may need to titrate his blood pressure medication up.

## 2022-08-01 NOTE — Assessment & Plan Note (Signed)
Seems exertional in nature.  Along with reproducible MSK chest pain.  Auscultation of lungs within normal limits in office.  Pending lab results and chest x-ray

## 2022-08-01 NOTE — Patient Instructions (Addendum)
Nice to see you today I will be in touch with the labs once I have them and chest xray You can take some over the counter mucinex for the cough and phlegm production  Drink plenty of fluid. You can take some over the counter tylenol for the chest discomfort. If the symptoms do not improve let us know

## 2022-08-01 NOTE — Progress Notes (Signed)
Acute Office Visit  Subjective:     Patient ID: Daniel [name], male    DOB: 05-08-1961, 61 y.o.   MRN: [id number]  Chief Complaint  Patient presents with   Chest Pain    Sx about 3 days ago. Chest pain And SOB and coughing up clear phlegm. Covid test negative at home on 07/31/22. Headache present. NO fever or chills.     Patient is in today for Chest pain  States that it happened approx 3-4 days ago. States that he is having chest pain. That is all the time described as aching. Coughing and feels like he cannot get a good breath. No sick Covid test at home that was negative yesterday Has not tried anything for the chest pain or cough  States that he went to move the yard, normally no trouble but tried this week and felt tired and not able to finish it.  See cardiology but no longer. Has a heart cath in 2020 that showed normal EF and minimal CAD.   No personal history of DVT or PE.  Patient states no recent surgery.  Patient also denies any recent car, plane, train travel of 4+ hours without taking breaks.  No longer followed by cardiology. Is followed by Cardiothoracic surgery for a mediastinal mass Review of Systems  Constitutional:  Positive for malaise/fatigue. Negative for chills and fever.  HENT:  Negative for ear discharge, ear pain, sinus pain and sore throat.   Respiratory:  Positive for cough. Negative for shortness of breath.   Cardiovascular:  Positive for chest pain. Negative for palpitations and leg swelling.  Gastrointestinal:  Positive for nausea (long standing). Negative for vomiting.  Neurological:  Positive for headaches. Negative for dizziness.        Objective:    BP (!) 162/104   Pulse 86   Temp 99.1 F (37.3 C)   Resp 12   Ht '5\' 11"'$  (1.803 m)   Wt 273 lb 4 oz (123.9 kg)   SpO2 96%   BMI 38.11 kg/m    Physical Exam Vitals reviewed.  Constitutional:      Appearance: He is well-developed. He is obese.  HENT:     Right Ear: Tympanic  membrane, ear canal and external ear normal.     Left Ear: Tympanic membrane, ear canal and external ear normal.     Mouth/Throat:     Mouth: Mucous membranes are moist.     Pharynx: Oropharynx is clear.  Eyes:     Extraocular Movements: Extraocular movements intact.     Pupils: Pupils are equal, round, and reactive to light.  Cardiovascular:     Rate and Rhythm: Normal rate and regular rhythm.     Heart sounds: Normal heart sounds.  Pulmonary:     Effort: Pulmonary effort is normal.     Breath sounds: Normal breath sounds.  Chest:     Chest wall: Tenderness present.  Musculoskeletal:     Right lower leg: 1+ Pitting Edema present.     Left lower leg: 1+ Pitting Edema present.  Lymphadenopathy:     Cervical: No cervical adenopathy.  Neurological:     Mental Status: He is alert.     No results found for any visits on 08/01/22.      Assessment & Plan:   Problem List Items Addressed This Visit       Cardiovascular and Mediastinum   Hypertension    Patient's blood pressure elevated in office on initial check and at  recheck.  Patient does check his blood pressure at home states last week he was within normal limits this week has been elevated likely due to its going on currently patient encouraged to check his blood pressure several times throughout the week and report back if being high if so he may need to titrate his blood pressure medication up.      Relevant Orders   TSH     Other   Atypical chest pain - Primary    Reproducible on palpation likely MSK related.  EKG within normal limits when compared to previous ones.  Pending labs and chest x-ray      Relevant Orders   CBC   Comprehensive metabolic panel   TSH   DG Chest 2 View   EKG 12-Lead (Completed)   Cough    Continue over-the-counter treatment as needed.  Pending labs and chest x-ray      Relevant Orders   DG Chest 2 View   Shortness of breath    Seems exertional in nature.  Along with reproducible  MSK chest pain.  Auscultation of lungs within normal limits in office.  Pending lab results and chest x-ray      Relevant Orders   CBC   Comprehensive metabolic panel   TSH   DG Chest 2 View   EKG 12-Lead (Completed)    No orders of the defined types were placed in this encounter.   Return if symptoms worsen or fail to improve.  Romilda Garret, NP

## 2022-08-01 NOTE — Assessment & Plan Note (Signed)
Reproducible on palpation likely MSK related.  EKG within normal limits when compared to previous ones.  Pending labs and chest x-ray

## 2022-08-05 ENCOUNTER — Telehealth: Payer: Self-pay | Admitting: Nurse Practitioner

## 2022-08-05 NOTE — Telephone Encounter (Signed)
If blood pressure stays elevated then he will need to make an appointment with Dr. Damita Dunnings to discuss medication titrations or changes

## 2022-08-05 NOTE — Telephone Encounter (Signed)
-----   Message from Verdi sent at 08/05/2022  4:36 PM EDT ----- Patient advised. Patient feels much better-normal. Reading over the weekend one time was 153/80. No new symptoms or any other symptoms he was having.

## 2022-08-06 NOTE — Telephone Encounter (Signed)
Left message to call back  

## 2022-08-08 NOTE — Telephone Encounter (Signed)
Left message to call back  

## 2022-08-12 NOTE — Telephone Encounter (Signed)
Left message to call back  

## 2022-08-13 NOTE — Telephone Encounter (Signed)
Multiple attempts have been made without success. Letter mailed

## 2022-09-03 ENCOUNTER — Other Ambulatory Visit: Payer: Self-pay | Admitting: Family Medicine

## 2022-09-03 NOTE — Telephone Encounter (Signed)
Noted  

## 2022-09-03 NOTE — Telephone Encounter (Signed)
Called pt & scheduled cpe for 09/20/22

## 2022-09-03 NOTE — Telephone Encounter (Signed)
Please call patient and schedule lab work and annual physical.

## 2022-09-07 IMAGING — CT CT CHEST W/O CM
2 of 4 series · 11 of 36 positions shown, 13 images · non-contrast
Comparison: Chest CT June 08, 2020, chest CT February 10, 2020, and
chest CT January 27, 2019

CLINICAL DATA: Anterior mediastinal mass

EXAM:
CT CHEST WITHOUT CONTRAST
TECHNIQUE: Multidetector CT imaging of the chest was performed following the
standard protocol without IV contrast.

[Series 2: chest 2.00 br40 s3 · axial · 0.65mm/px · z∈[+1608,+1854]mm · 8 of 147 slices shown, 10 images (1 of 2)]
[im 12/147  mediastinal]
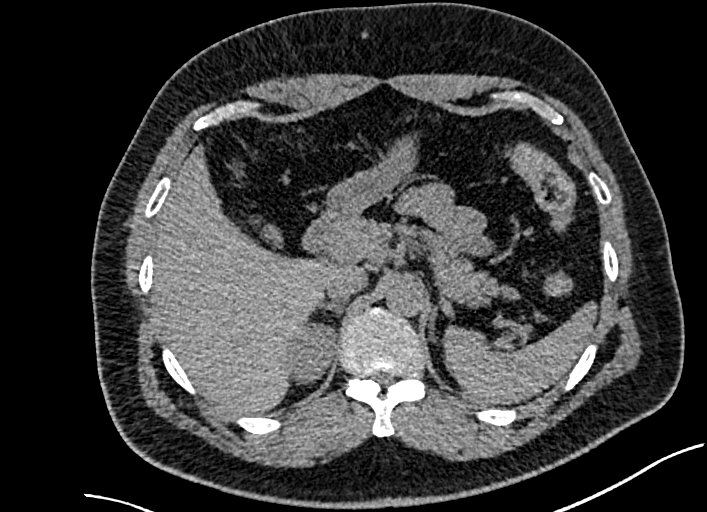
[im 12/147  lung]
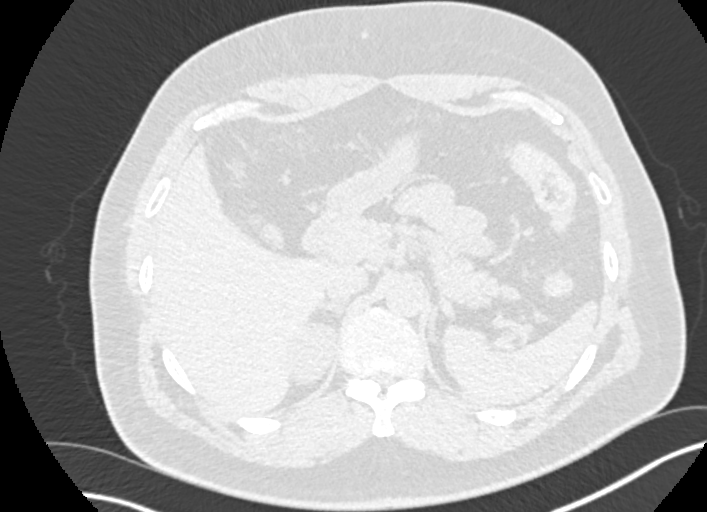
[im 34/147  lung]
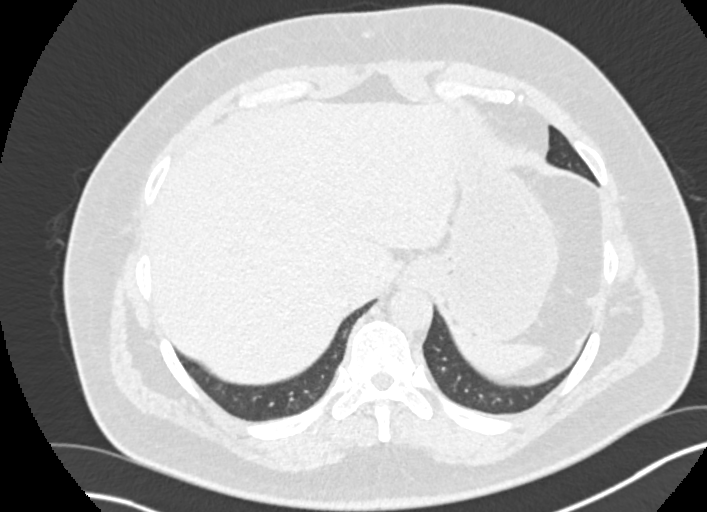
[im 45/147  lung]
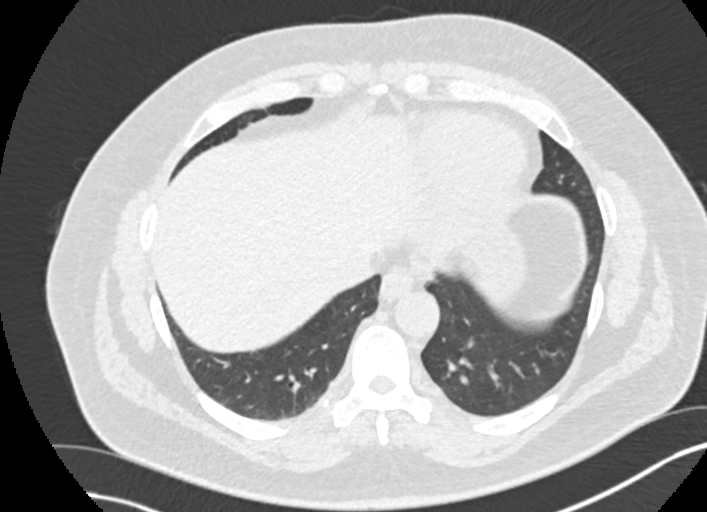
[im 68/147  lung]
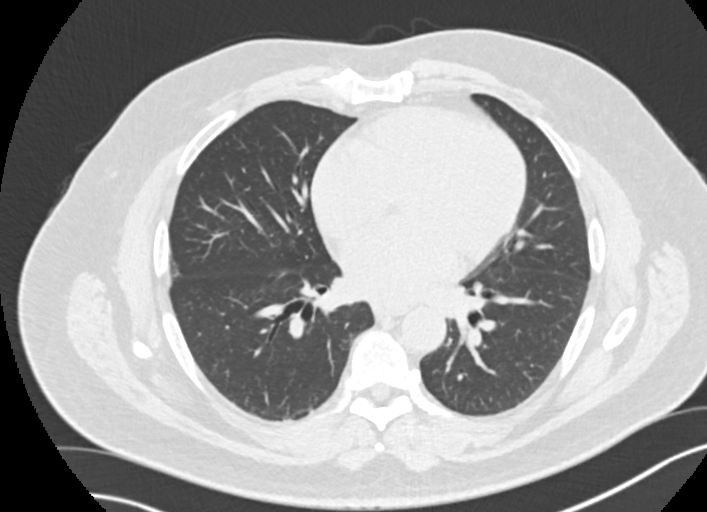
[im 79/147  mediastinal]
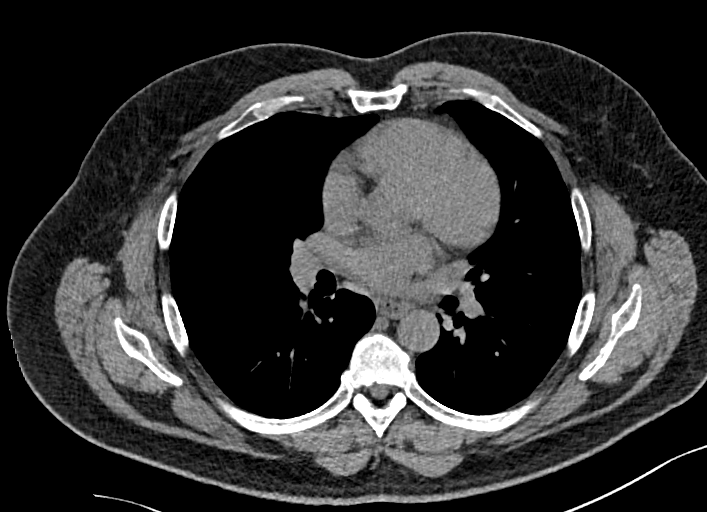
[im 79/147  lung]
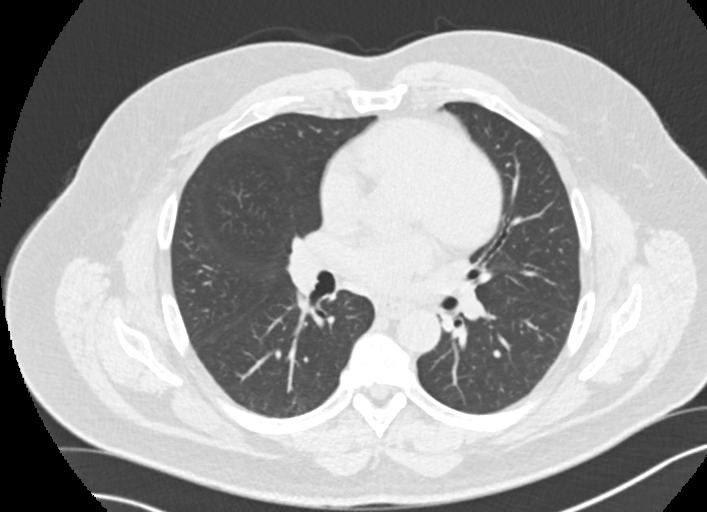
[im 102/147  lung]
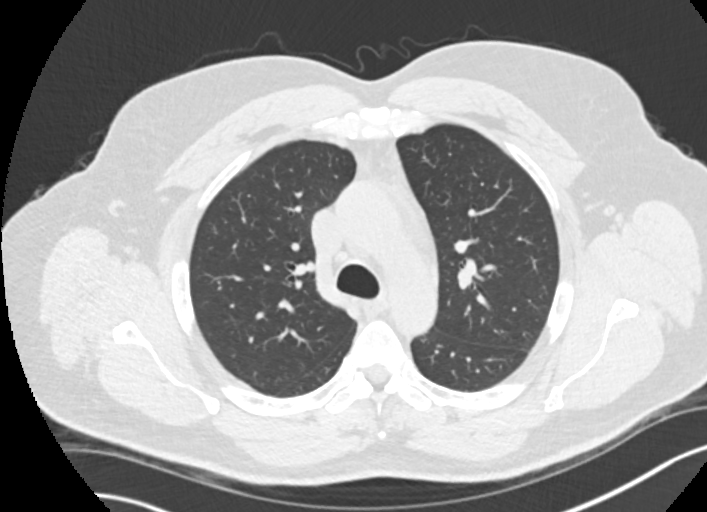
[im 113/147  lung]
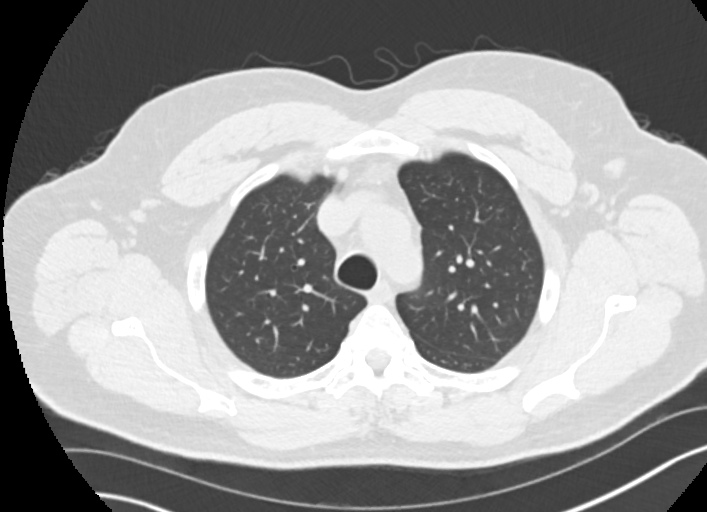
[im 135/147  lung]
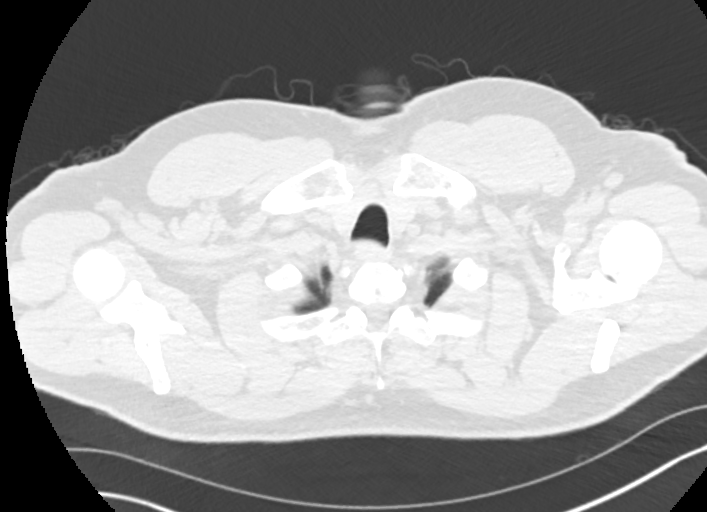

[Series 4: chest 2.00 br40 s3 · coronal · 0.58mm/px · 3 of 165 slices shown (2 of 2)]
[im 33/165  lung]
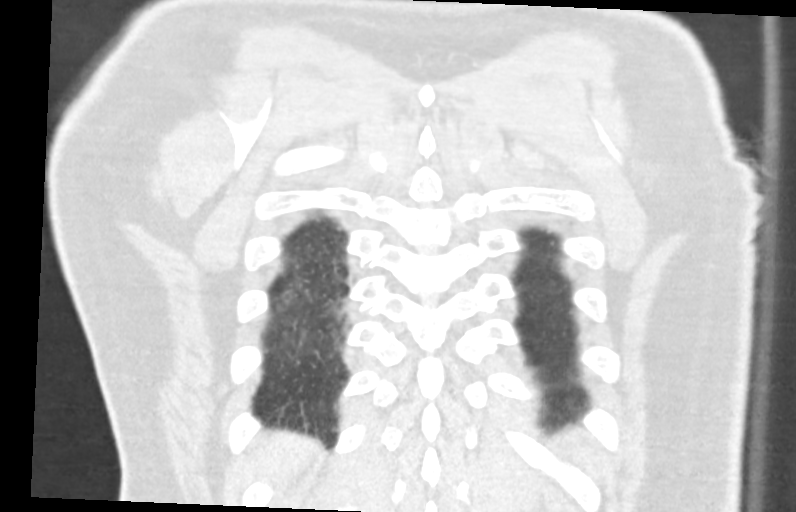
[im 66/165  lung]
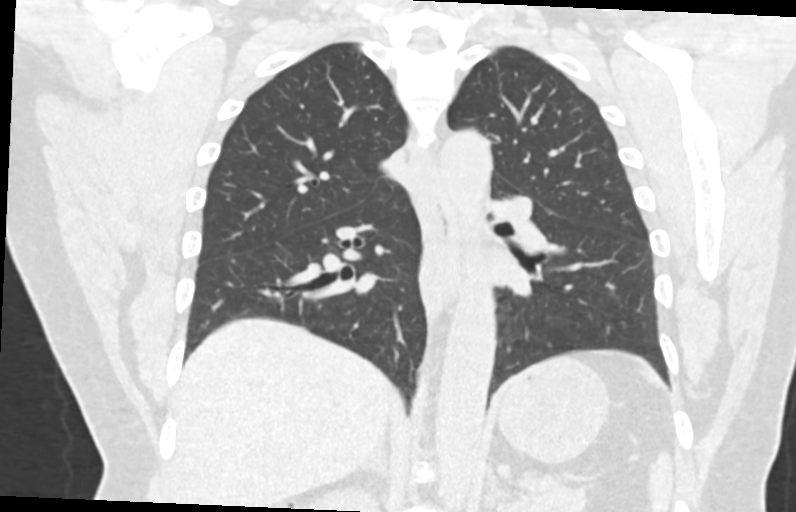
[im 99/165  lung]
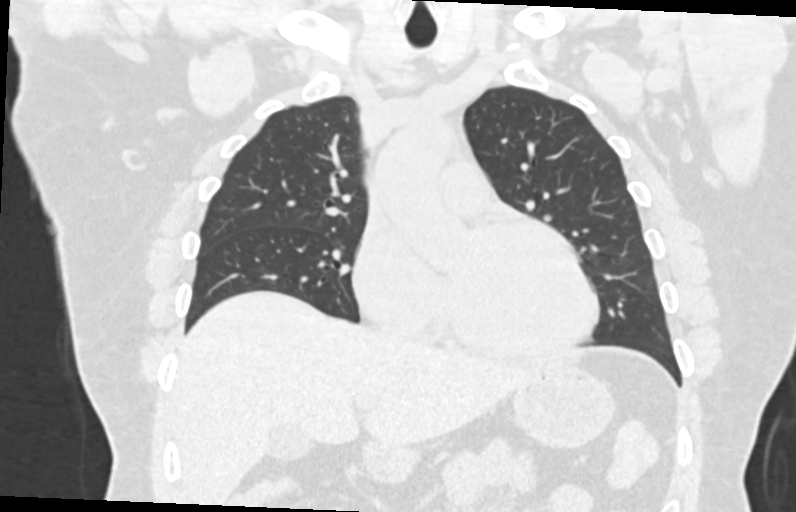

[11 of 36 positions shown; findings below may reference images not displayed]

FINDINGS: Cardiovascular: There is no appreciable thoracic aortic aneurysm.
Visualized great vessels appear unremarkable. Note that the right
innominate and left common carotid arteries arise as a common trunk,
an anatomic variant. No pericardial effusion or pericardial
thickening is evident.

Mediastinum/Nodes: Visualized thyroid appears normal. The previously
noted soft tissue fullness in the anterior mediastinum is again
noted, currently measuring 2.0 x 1.4 cm, stable compared to prior
study. Suggestion of fat within this structure. No progression of
soft tissue fullness in this area or change in configuration.
Elsewhere, there is no evident thoracic adenopathy. There is a small
hiatal hernia.

Lungs/Pleura: Trachea and major bronchial structures appear
unremarkable. 2 mm nodular opacity in the posterior segment of the
right upper lobe is seen on axial slice 76 series 8 and is stable.
There is mild right base atelectasis. No edema or airspace opacity.
No evident pleural effusions. No pneumothorax.

Upper Abdomen: Visualized upper abdominal structures appear
unremarkable.

Musculoskeletal: There are no blastic or lytic bone lesions. No
chest wall lesions evident.
IMPRESSION: 1. Stable focus of soft tissue fullness measuring 2.0 x 1.4 cm in
the anterior mediastinum with suggestion of fat within this area
compared to prior studies from May 2020 and January 2020. This
structure was not appreciable on January 2019 study. Suspect thymic
hyperplasia given this appearance; appearance suggests benign
etiology. It may be reasonable to consider a follow-up study in an
additional 6-12 months to confirm stability and 2 provide greater
than 2 year stability surveillance. No new focus of opacity in the
anterior mediastinum. Myopathy elsewhere noted.

2. 2 mm nodular opacity posterior segment right upper lobe, stable.
Slight right base atelectasis. Lungs otherwise clear.

## 2022-09-08 ENCOUNTER — Other Ambulatory Visit: Payer: Self-pay | Admitting: Family Medicine

## 2022-09-08 DIAGNOSIS — Z125 Encounter for screening for malignant neoplasm of prostate: Secondary | ICD-10-CM

## 2022-09-08 DIAGNOSIS — I10 Essential (primary) hypertension: Secondary | ICD-10-CM

## 2022-09-08 DIAGNOSIS — R739 Hyperglycemia, unspecified: Secondary | ICD-10-CM

## 2022-09-13 ENCOUNTER — Other Ambulatory Visit (INDEPENDENT_AMBULATORY_CARE_PROVIDER_SITE_OTHER): Payer: 59

## 2022-09-13 DIAGNOSIS — R739 Hyperglycemia, unspecified: Secondary | ICD-10-CM

## 2022-09-13 DIAGNOSIS — Z125 Encounter for screening for malignant neoplasm of prostate: Secondary | ICD-10-CM | POA: Diagnosis not present

## 2022-09-13 DIAGNOSIS — I10 Essential (primary) hypertension: Secondary | ICD-10-CM | POA: Diagnosis not present

## 2022-09-13 LAB — LIPID PANEL
Cholesterol: 148 mg/dL (ref 0–200)
HDL: 44 mg/dL (ref >39.00)
LDL Cholesterol: 88 mg/dL (ref 0–99)
NonHDL: 103.68
Total CHOL/HDL Ratio: 3
Triglycerides: 80 mg/dL (ref 0.0–149.0)
VLDL: 16 mg/dL (ref 0.0–40.0)

## 2022-09-13 LAB — PSA: PSA: 0 ng/mL — ABNORMAL LOW (ref 0.10–4.00)

## 2022-09-13 LAB — HEMOGLOBIN A1C: Hgb A1c MFr Bld: 6.3 % (ref 4.6–6.5)

## 2022-09-20 ENCOUNTER — Encounter: Payer: Self-pay | Admitting: Family Medicine

## 2022-09-20 ENCOUNTER — Ambulatory Visit (INDEPENDENT_AMBULATORY_CARE_PROVIDER_SITE_OTHER): Payer: 59 | Admitting: Family Medicine

## 2022-09-20 VITALS — BP 150/80 | HR 88 | Temp 99.0°F | Ht 71.0 in | Wt 276.0 lb

## 2022-09-20 DIAGNOSIS — E782 Mixed hyperlipidemia: Secondary | ICD-10-CM

## 2022-09-20 DIAGNOSIS — I10 Essential (primary) hypertension: Secondary | ICD-10-CM

## 2022-09-20 DIAGNOSIS — Z23 Encounter for immunization: Secondary | ICD-10-CM

## 2022-09-20 DIAGNOSIS — Z0000 Encounter for general adult medical examination without abnormal findings: Secondary | ICD-10-CM

## 2022-09-20 DIAGNOSIS — Z7189 Other specified counseling: Secondary | ICD-10-CM

## 2022-09-20 MED ORDER — LOSARTAN POTASSIUM 50 MG PO TABS: 50.0000 mg | ORAL_TABLET | Freq: Every day | ORAL | 3 refills | Status: DC

## 2022-09-20 MED ORDER — PANTOPRAZOLE SODIUM 40 MG PO TBEC: 40.0000 mg | DELAYED_RELEASE_TABLET | Freq: Every day | ORAL | 3 refills | Status: DC

## 2022-09-20 MED ORDER — AMLODIPINE BESYLATE 10 MG PO TABS: 10.0000 mg | ORAL_TABLET | Freq: Every day | ORAL | 3 refills | Status: DC

## 2022-09-20 MED ORDER — PRAVASTATIN SODIUM 40 MG PO TABS: 40.0000 mg | ORAL_TABLET | Freq: Every day | ORAL | 3 refills | Status: DC

## 2022-09-20 NOTE — Patient Instructions (Addendum)
Flu shot today.  Increase losartan to 2 tabs if needed. Update me as needed.  Take care.  Glad to see you.

## 2022-09-20 NOTE — Progress Notes (Signed)
CPE- See plan.  Routine anticipatory guidance given to patient.  See health maintenance.  The possibility exists that previously documented standard health maintenance information may have been brought forward from a previous encounter into this note.  If needed, that same information has been updated to reflect the current situation based on today's encounter.    Tetanus 2014 Flu shot 2023 PNA due at 65.   Shingles d/w pt.   covid vaccine d/w pt.  Done 2021.  Moderna 02/25/20 and 03/24/20.   HIV and HCV neg 2014 per patient report with the needle stick event 2014 Colonoscopy 2022 PSA zero.  Living will.  Would have his daughter designated if he were incapacitated.   Diet and exercise d/w pt.  Walking at work.    Hypertension:    Using medication without problems or lightheadedness: yes Chest pain with exertion:no Edema:no Short of breath:no Average home BPs: similar on home checks.    Elevated Cholesterol: Using medications without problems: yes Muscle aches: no Diet compliance: d/w pt.   Exercise: yes  Prev chest sx resolved.   PMH and SH reviewed  Meds, vitals, and allergies reviewed.   ROS: Per HPI.  Unless specifically indicated otherwise in HPI, the patient denies:  General: fever. Eyes: acute vision changes ENT: sore throat Cardiovascular: chest pain Respiratory: SOB GI: vomiting GU: dysuria Musculoskeletal: acute back pain Derm: acute rash Neuro: acute motor dysfunction Psych: worsening mood Endocrine: polydipsia Heme: bleeding Allergy: hayfever  GEN: nad, alert and oriented HEENT: ncat NECK: supple w/o LA CV: rrr. PULM: ctab, no inc wob ABD: soft, +bs EXT: no edema SKIN: no acute rash

## 2022-09-22 NOTE — Assessment & Plan Note (Signed)
Living will.  Would have his daughter designated if he were incapacitated.

## 2022-09-22 NOTE — Assessment & Plan Note (Signed)
Tetanus 2014 Flu shot 2023 PNA due at 65.   Shingles d/w pt.   covid vaccine d/w pt.  Done 2021.  Moderna 02/25/20 and 03/24/20.   HIV and HCV neg 2014 per patient report with the needle stick event 2014 Colonoscopy 2022 PSA zero.  Living will.  Would have his daughter designated if he were incapacitated.   Diet and exercise d/w pt.  Walking at work.

## 2022-09-22 NOTE — Assessment & Plan Note (Signed)
Labs discussed with patient.  Continue pravastatin.

## 2022-09-22 NOTE — Assessment & Plan Note (Signed)
Continue amlodipine and losartan.  If he has persistent blood pressure elevation then he can increase the losartan up to 100 mg a day.  He will update me as needed.

## 2022-12-31 ENCOUNTER — Encounter: Payer: Self-pay | Admitting: Family Medicine

## 2022-12-31 ENCOUNTER — Ambulatory Visit: Payer: 59 | Admitting: Family Medicine

## 2022-12-31 VITALS — BP 132/70 | HR 94 | Temp 98.1°F | Ht 71.0 in | Wt 273.0 lb

## 2022-12-31 DIAGNOSIS — R109 Unspecified abdominal pain: Secondary | ICD-10-CM

## 2022-12-31 MED ORDER — LOSARTAN POTASSIUM 100 MG PO TABS: 100.0000 mg | ORAL_TABLET | Freq: Every day | ORAL | 3 refills | Status: DC

## 2022-12-31 NOTE — Progress Notes (Signed)
Has been taking amlodipine '10mg'$  and losartan '100mg'$  a day.   D/w pt about rx update (had been taking '50mg'$  x2 of losartan).  Rx sent.    BP 132/70 on home check.   He had prostate surgery 2020.    He has had episodic pain, after sneezing.  Going on for about 6 months but worse recently.  Nausea with recent episode and that was atypical.  No vomiting.  He had to massage his stomach after the event.  No exertional CP.  Not SOB.  No heartburn.  He didn't feel a lump or a bulge but felt a cramp in the lower abd muscles B, not in midline or inguinal area.  Some occ leg cramp in the AM, upon waking.  No cramping with walking.  No FCNAVD.  No blood in urine or stools.  He had mild, not sharp, pain near the sternum in the meantime- that isn't exertional.    Meds, vitals, and allergies reviewed.   ROS: Per HPI unless specifically indicated in ROS section   GEN: nad, alert and oriented HEENT: ncat NECK: supple w/o LA CV: rrr. PULM: ctab, no inc wob ABD: soft, +bs, nontender.  Midline abdominal scar noted.  No hernia noted on exam. EXT: no edema SKIN: no acute rash

## 2022-12-31 NOTE — Patient Instructions (Addendum)
Possible muscle spasm in the abdominal wall after sneezing.  If worse/not better or recurrent, reasonable to consider imaging to exclude a hernia.    Possible reflux, would add on TUMS for a few days.  Update me if not better in a few days.    Take care.  Glad to see you.

## 2023-01-01 DIAGNOSIS — R109 Unspecified abdominal pain: Secondary | ICD-10-CM | POA: Insufficient documentation

## 2023-01-01 NOTE — Assessment & Plan Note (Signed)
Benign exam currently. Possible muscle spasm in the abdominal wall after sneezing.  If worse/not better or recurrent, reasonable to consider imaging to exclude a hernia.  Discussed.  He will update me as needed.  Also with possible reflux, would add on TUMS for a few days.  He can update me if not better in a few days.

## 2023-06-17 ENCOUNTER — Encounter: Payer: Self-pay | Admitting: Family Medicine

## 2023-06-17 ENCOUNTER — Ambulatory Visit: Payer: 59 | Admitting: Family Medicine

## 2023-06-17 VITALS — BP 124/84 | HR 80 | Temp 98.1°F | Ht 71.0 in | Wt 273.0 lb

## 2023-06-17 DIAGNOSIS — R609 Edema, unspecified: Secondary | ICD-10-CM | POA: Diagnosis not present

## 2023-06-17 LAB — BASIC METABOLIC PANEL
BUN: 15 mg/dL (ref 6–23)
CO2: 26 mEq/L (ref 19–32)
Calcium: 9.4 mg/dL (ref 8.4–10.5)
Chloride: 103 mEq/L (ref 96–112)
Creatinine, Ser: 1.09 mg/dL (ref 0.40–1.50)
GFR: 72.93 mL/min (ref >60.00)
Glucose, Bld: 93 mg/dL (ref 70–99)
Potassium: 3.9 mEq/L (ref 3.5–5.1)
Sodium: 137 mEq/L (ref 135–145)

## 2023-06-17 MED ORDER — AMLODIPINE BESYLATE 10 MG PO TABS: 5.0000 mg | ORAL_TABLET | Freq: Every day | ORAL | Status: DC

## 2023-06-17 NOTE — Patient Instructions (Addendum)
Keep drinking enough water to keep your urine clear or light colored.  Go to the lab on the way out.   If you have mychart we'll likely use that to update you.    Cut amlodipine back to 5mg  a day- 1/2 tab.   Update me in about 1 week about your BP and swelling.  Take care.  Glad to see you.

## 2023-06-17 NOTE — Progress Notes (Unsigned)
Episodic BLE edema. Rare use of naproxen.  He noted a sock line mid calf, bilateral.  No CP.  Not SOB.  Swelling comes and goes, more at the end of the day.  Not SOB supine.  Minimal salt.    He has B but R>L knee arthritis, has injection with some relief.    Meds, vitals, and allergies reviewed.   ROS: Per HPI unless specifically indicated in ROS section   Not pitting edema no BLE edema.

## 2023-06-18 DIAGNOSIS — R609 Edema, unspecified: Secondary | ICD-10-CM | POA: Insufficient documentation

## 2023-06-18 NOTE — Assessment & Plan Note (Signed)
Advised to drinking enough water to keep his urine clear or light colored.  See notes on labs.  Discussed that amlodipine could contribute. Cut amlodipine back to 5mg  a day- 1/2 tab.  He can update me in about 1 week about his BP and swelling.

## 2023-08-29 ENCOUNTER — Ambulatory Visit
Admission: RE | Admit: 2023-08-29 | Discharge: 2023-08-29 | Disposition: A | Discharge status: Home / Self Care | Payer: 59 | Source: Ambulatory Visit | Attending: Internal Medicine | Admitting: Internal Medicine

## 2023-08-29 ENCOUNTER — Ambulatory Visit: Payer: 59 | Admitting: Internal Medicine

## 2023-08-29 ENCOUNTER — Encounter: Payer: Self-pay | Admitting: Internal Medicine

## 2023-08-29 VITALS — BP 138/88 | HR 77 | Temp 98.1°F | Ht 71.0 in | Wt 273.0 lb

## 2023-08-29 DIAGNOSIS — R1084 Generalized abdominal pain: Secondary | ICD-10-CM | POA: Insufficient documentation

## 2023-08-29 DIAGNOSIS — K828 Other specified diseases of gallbladder: Secondary | ICD-10-CM | POA: Diagnosis not present

## 2023-08-29 LAB — CBC
HCT: 45 % (ref 39.0–52.0)
Hemoglobin: 14.2 g/dL (ref 13.0–17.0)
MCHC: 31.7 g/dL (ref 30.0–36.0)
MCV: 80.3 fl (ref 78.0–100.0)
Platelets: 243 10*3/uL (ref 150.0–400.0)
RBC: 5.6 Mil/uL (ref 4.22–5.81)
RDW: 15.3 % (ref 11.5–15.5)
WBC: 5.6 10*3/uL (ref 4.0–10.5)

## 2023-08-29 LAB — COMPREHENSIVE METABOLIC PANEL
ALT: 11 U/L (ref 0–53)
AST: 13 U/L (ref 0–37)
Albumin: 3.9 g/dL (ref 3.5–5.2)
Alkaline Phosphatase: 53 U/L (ref 39–117)
BUN: 14 mg/dL (ref 6–23)
CO2: 28 meq/L (ref 19–32)
Calcium: 9 mg/dL (ref 8.4–10.5)
Chloride: 104 meq/L (ref 96–112)
Creatinine, Ser: 1.08 mg/dL (ref 0.40–1.50)
GFR: 73.64 mL/min (ref >60.00)
Glucose, Bld: 88 mg/dL (ref 70–99)
Potassium: 4.2 meq/L (ref 3.5–5.1)
Sodium: 138 meq/L (ref 135–145)
Total Bilirubin: 0.6 mg/dL (ref 0.2–1.2)
Total Protein: 7.2 g/dL (ref 6.0–8.3)

## 2023-08-29 LAB — LIPASE: Lipase: 23 U/L (ref 11.0–59.0)

## 2023-08-29 NOTE — Assessment & Plan Note (Signed)
Did get ultrasound today and no stones but does have dilated gallbladder. His symptoms are most compatible with gallbladder disease--but certainly not clear cut Will set up soon with surgery for evaluation

## 2023-08-29 NOTE — Addendum Note (Signed)
Addended by: Tillman Abide I on: 08/29/2023 01:23 PM   Modules accepted: Orders

## 2023-08-29 NOTE — Assessment & Plan Note (Signed)
Points to mid abdomen but hurts at times all over Mostly post prandial Bowels are moving normal--makes partial SBO less likely (but still possible) Prominent RUQ findings make gallbladder disease most likely Could also be stomach (gastritis) but not likely Need to consider pancreatitis--though likely secondary to stone if present Will check labs today Set up for RUQ ultrasound on Monday--will surgical referral if gallbladder disease confirmed To ER if worsens

## 2023-08-29 NOTE — Progress Notes (Signed)
Subjective:    Patient ID: Daniel [name], male    DOB: 09-Jan-1961, 62 y.o.   MRN: [id number]  HPI Here due to abdominal pain  Having some abdominal pain---points broadly around mid abdomen --but lower and upper Started about a week ago--off and on After eating--will get nausea (though no vomiting) After salad recently, had shooting pain up chest  Bowels daily like usual---slightly loose No blood No fever No one else is sick in his household  Current Outpatient Medications on File Prior to Visit  Medication Sig Dispense Refill   amLODipine (NORVASC) 10 MG tablet Take 0.5 tablets (5 mg total) by mouth daily.     aspirin EC 81 MG tablet Take 81 mg by mouth daily. Swallow whole.     losartan (COZAAR) 100 MG tablet Take 1 tablet (100 mg total) by mouth daily. 90 tablet 3   naproxen (NAPROSYN) 500 MG tablet Take 1 tablet (500 mg total) by mouth daily as needed.     pantoprazole (PROTONIX) 40 MG tablet Take 1 tablet (40 mg total) by mouth daily. 90 tablet 3   pravastatin (PRAVACHOL) 40 MG tablet Take 1 tablet (40 mg total) by mouth daily. 90 tablet 3   No current facility-administered medications on file prior to visit.    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 every once in a while  (he is an exPsychologist, sport and exercise)   Prostate cancer (HCC)    Tubular adenoma of colon 11/2011   Villous adenoma of right colon 03/12/2012   Laparoscopic right colectomy 02/25/2012     Past Surgical History:  Procedure Laterality Date   APPENDECTOMY  2013   COLON RESECTION  02/25/2012   Procedure: LAPAROSCOPIC RIGHT COLON RESECTION;  Surgeon: Ernestene Mention, MD;  Location: [facility];  Service: General;  Laterality: Right;  laparoscopic assisted right colectomy   COLONOSCOPY  11/21/11   LAPAROSCOPIC RIGHT COLON RESECTION  2013   adenomatous polyp   LEFT HEART CATH AND CORONARY ANGIOGRAPHY N/A 01/28/2019   Procedure: LEFT HEART CATH AND CORONARY ANGIOGRAPHY;  Surgeon: Marykay Lex, MD;  Location: [facility] INVASIVE CV LAB;  Service: Cardiovascular;  Laterality: N/A;   LYMPHADENECTOMY Bilateral 04/22/2019   Procedure: LYMPHADENECTOMY, PELVIC;  Surgeon: Heloise Purpura, MD;  Location: [facility];  Service: Urology;  Laterality: Bilateral;   PROSTATE BIOPSY     ROBOT ASSISTED LAPAROSCOPIC RADICAL PROSTATECTOMY N/A  04/22/2019   Procedure: XI ROBOTIC ASSISTED LAPAROSCOPIC RADICAL PROSTATECTOMY LEVEL 3;  Surgeon: Heloise Purpura, MD;  Location: [facility];  Service: Urology;  Laterality: N/A;  ONLY NEEDS 210 MIN FOR ALL PROCEDURES    Family History  Problem Relation Age of Onset   Heart disease Mother 1       MI, died at age 65   Hypertension Mother    Coronary artery disease Mother    Alcohol abuse Father        cirrhoisis of liver-- ETOH abuse   Heart disease Brother    Colon cancer Neg Hx    Prostate cancer Neg Hx    Esophageal cancer Neg Hx    Rectal cancer Neg Hx    Stomach cancer Neg Hx     Social History   Socioeconomic History   Marital status: Married    Spouse name:  Not on file   Number of children: 2   Years of education: Not on file   Highest education level: Not on file  Occupational History   Occupation: [employer]    Employer: UNEMPLOYED  Tobacco Use   Smoking status: Never   Smokeless tobacco: Never  Vaping Use   Vaping status: Never Used  Substance and Sexual Activity   Alcohol use: No   Drug use: No   Sexual activity: Yes  Other Topics Concern   Not on file  Social History Narrative   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   Divorced 20+ years   Remarried 2017   Retired from [employer] 2019   Back working for city of [employer] as of 2022 [employer])   1 son/1 daughter   1 grandchild born 2019 Veterinary surgeon)   [employer] football, baseball, and basketball   Social Determinants of Health   Financial Resource Strain: Not on Ship broker Insecurity: Not on file  Transportation Needs: Not on file  Physical Activity: Not on file  Stress: Not on file  Social Connections: Not on file  Intimate Partner Violence: Not on file   Review of Systems No dysphagia No SOB Only cough was with the chest episode after salad---still takes the protonix daily    Objective:   Physical Exam Constitutional:      Appearance: He is well-developed.  Cardiovascular:     Rate and Rhythm: Normal rate and regular rhythm.     Heart sounds: No murmur heard.    No gallop.  Pulmonary:     Effort: Pulmonary effort is normal.     Breath sounds: Normal breath sounds. No wheezing or rales.  Abdominal:     General: Bowel sounds are normal.     Palpations: Abdomen is soft.     Tenderness: There is no guarding or rebound.     Comments: Mild mid abdominal pain but worse in RUQ with mildly positive Murphy's sign  Musculoskeletal:     Cervical back: Neck supple.     Right lower leg: No edema.     Left lower leg: No edema.  Lymphadenopathy:     Cervical: No cervical adenopathy.  Neurological:     Mental Status: He is alert.            Assessment &  Plan:

## 2023-09-01 ENCOUNTER — Ambulatory Visit: Payer: 59

## 2023-09-06 ENCOUNTER — Other Ambulatory Visit: Payer: Self-pay | Admitting: Family Medicine

## 2023-09-24 ENCOUNTER — Telehealth: Payer: 59 | Admitting: Family Medicine

## 2023-09-24 ENCOUNTER — Encounter: Payer: Self-pay | Admitting: Family Medicine

## 2023-09-24 VITALS — Temp 96.6°F | Ht 71.0 in | Wt 263.0 lb

## 2023-09-24 DIAGNOSIS — U071 COVID-19: Secondary | ICD-10-CM

## 2023-09-24 MED ORDER — DEXTROMETHORPHAN POLISTIREX ER 30 MG/5ML PO SUER: 15.0000 mg | Freq: Every evening | ORAL | 0 refills | Status: DC | PRN

## 2023-09-24 MED ORDER — NIRMATRELVIR/RITONAVIR (PAXLOVID)TABLET
3.0000 | ORAL_TABLET | Freq: Two times a day (BID) | ORAL | 0 refills | Status: AC
Start: 2023-09-24 — End: 2023-09-29

## 2023-09-24 NOTE — Patient Instructions (Signed)
It was a pleasure meeting you today. Thank you for allowing me to take part in your health care.  Our goals for today as we discussed include:  Symptomatic management for fever, muscle aches and headaches  -Tylenol 325-500 mg every 6 hours as needed -Ibuprofen 200 mg every 8 hours as needed  Stay well hydrated Rest as needed with frequent repositioning and ambulation.  Increase activity as soon as tolerated to help with recovery.  Continue wearing masks, hand washing and self isolation until symptom free.  If you have worsening symptoms, especially difficulty breathing please call 911 or have someone take you to the emergency department.    If you have any questions or concerns, please do not hesitate to call the office at [phone].  I look forward to our next visit and until then take care and stay safe.  Regards,   Carollee Leitz, MD   [facility]

## 2023-09-24 NOTE — Progress Notes (Signed)
Virtual Visit via Video note  I connected with Daniel [name] on 09/28/23 at 1059 by video and verified that I am speaking with the correct person using two identifiers. Daniel [name] is currently located at home and is currently with alone during visit. The provider, Dana Allan, MD is located in their home at time of visit.  I discussed the limitations, risks, security and privacy concerns of performing an evaluation and management service by video and the availability of in person appointments. I also discussed with the patient that there may be a patient responsible charge related to this service. The patient expressed understanding and agreed to proceed.  Subjective: PCP: Joaquim Nam, MD  Chief Complaint  Patient presents with   Covid Positive    Tested + 09/19/2023 cough, headache, and snuffy nose    HPI Discussed the use of AI scribe software for clinical note transcription with the patient, who gave verbal consent to proceed.  History of Present Illness The patient, a 61 year old individual with a history of COVID-19 infection, presented for a telehealth consultation due to a recent positive COVID-19 test. The patient first tested positive on a Friday and began experiencing symptoms the following day. Symptoms included a stuffy nose, dry cough, and headache. The patient denied experiencing any fevers on the day of the consultation, but did not specify if fevers were present on previous days. The patient's spouse also tested positive for COVID-19 on the Friday prior to the consultation.  The patient had been managing symptoms with over-the-counter Robitussin for cough suppression and Tylenol for headache relief, but reported that the Tylenol was not effectively alleviating the headache. The patient had previously received a COVID-19 vaccination when it first became available and had a prior COVID-19 infection, during which the only symptom was a dry cough that lasted one  day.  The patient reported a loss of taste and smell during the current infection, a symptom not experienced during the previous infection. The patient's oxygen saturation was 97% and pulse was 81 at the time of the consultation. The patient works part-time, primarily outdoors. The patient's medication list includes naproxen and aspirin, but the patient denied current use of naproxen.     ROS: Per HPI  Current Outpatient Medications:    amLODipine (NORVASC) 10 MG tablet, TAKE 1 TABLET BY MOUTH EVERY DAY, Disp: 30 tablet, Rfl: 3   aspirin EC 81 MG tablet, Take 81 mg by mouth daily. Swallow whole., Disp: , Rfl:    dextromethorphan (DELSYM) 30 MG/5ML liquid, Take 2.5 mLs (15 mg total) by mouth at bedtime as needed for cough., Disp: 89 mL, Rfl: 0   losartan (COZAAR) 100 MG tablet, Take 1 tablet (100 mg total) by mouth daily., Disp: 90 tablet, Rfl: 3   naproxen (NAPROSYN) 500 MG tablet, Take 1 tablet (500 mg total) by mouth daily as needed., Disp: , Rfl:    nirmatrelvir/ritonavir (PAXLOVID) 20 x 150 MG & 10 x 100MG  TABS, Take 3 tablets by mouth 2 (two) times daily for 5 days. (Take nirmatrelvir 150 mg two tablets twice daily for 5 days and ritonavir 100 mg one tablet twice daily for 5 days) Patient GFR is 73.64, Disp: 30 tablet, Rfl: 0   pantoprazole (PROTONIX) 40 MG tablet, Take 1 tablet (40 mg total) by mouth daily., Disp: 90 tablet, Rfl: 3   pravastatin (PRAVACHOL) 40 MG tablet, TAKE 1 TABLET BY MOUTH EVERY DAY, Disp: 30 tablet, Rfl: 3  Observations/Objective: Physical Exam Vitals reviewed.  Constitutional:  General: He is not in acute distress.    Appearance: Normal appearance. He is not toxic-appearing.  Eyes:     Conjunctiva/sclera: Conjunctivae normal.  Pulmonary:     Effort: Pulmonary effort is normal.  Neurological:     Mental Status: He is alert. Mental status is at baseline.  Psychiatric:        Mood and Affect: Mood normal.        Behavior: Behavior normal.        Thought  Content: Thought content normal.        Judgment: Judgment normal.    Assessment and Plan: Positive self-administered antigen test for COVID-19 Assessment & Plan: Positive test on 09/20/2023 with symptoms starting on 09/21/2023. Symptoms include stuffy nose, dry cough, and headache. No fever or respiratory distress. Oxygen saturation is 97%. Patient has been taking Robitussin for cough and Tylenol for headache with limited relief. Patient has had COVID-19 in the past but did not receive antiviral therapy. -Start Paxlovid, 3 tablets twice daily for 5 days. -Continue supportive care with Robitussin and Tylenol as needed. -Stay well hydrated and rest at home until symptom-free. -Continue to monitor symptoms and oxygen saturation at home. -Contact clinic if symptoms worsen or persist beyond expected course.     Orders: -     nirmatrelvir/ritonavir; Take 3 tablets by mouth 2 (two) times daily for 5 days. (Take nirmatrelvir 150 mg two tablets twice daily for 5 days and ritonavir 100 mg one tablet twice daily for 5 days) Patient GFR is 73.64  Dispense: 30 tablet; Refill: 0 -     Dextromethorphan Polistirex ER; Take 2.5 mLs (15 mg total) by mouth at bedtime as needed for cough.  Dispense: 89 mL; Refill: 0   Follow Up Instructions: Return if symptoms worsen or fail to improve, for PCP.   I discussed the assessment and treatment plan with the patient. The patient was provided an opportunity to ask questions and all were answered. The patient agreed with the plan and demonstrated an understanding of the instructions.   The patient was advised to call back or seek an in-person evaluation if the symptoms worsen or if the condition fails to improve as anticipated.  The above assessment and management plan was discussed with the patient. The patient verbalized understanding of and has agreed to the management plan. Patient is aware to call the clinic if symptoms persist or worsen. Patient is aware when to  return to the clinic for a follow-up visit. Patient educated on when it is appropriate to go to the emergency department.     Dana Allan, MD

## 2023-09-28 ENCOUNTER — Encounter: Payer: Self-pay | Admitting: Family Medicine

## 2023-09-28 DIAGNOSIS — U071 COVID-19: Secondary | ICD-10-CM | POA: Insufficient documentation

## 2023-09-28 DIAGNOSIS — R519 Headache, unspecified: Secondary | ICD-10-CM | POA: Insufficient documentation

## 2023-09-28 NOTE — Assessment & Plan Note (Addendum)
Positive test on 09/20/2023 with symptoms starting on 09/21/2023. Symptoms include stuffy nose, dry cough, and headache. No fever or respiratory distress. Oxygen saturation is 97%. Patient has been taking Robitussin for cough and Tylenol for headache with limited relief. Patient has had COVID-19 in the past but did not receive antiviral therapy. -Start Paxlovid, 3 tablets twice daily for 5 days. -Continue supportive care with Robitussin and Tylenol as needed. -Stay well hydrated and rest at home until symptom-free. -Continue to monitor symptoms and oxygen saturation at home. -Contact clinic if symptoms worsen or persist beyond expected course.

## 2023-09-28 NOTE — Assessment & Plan Note (Signed)
Persistent despite Tylenol use. Possible rebound headache from frequent Tylenol use. Likely from viral infection. -Limit Tylenol use to avoid rebound headaches.

## 2023-12-01 ENCOUNTER — Emergency Department (HOSPITAL_COMMUNITY)
Admission: EM | Admit: 2023-12-01 | Discharge: 2023-12-02 | Disposition: A | Discharge status: Home / Self Care | Payer: 59 | Attending: Emergency Medicine | Admitting: Emergency Medicine

## 2023-12-01 ENCOUNTER — Emergency Department (HOSPITAL_COMMUNITY): Payer: 59

## 2023-12-01 ENCOUNTER — Encounter (HOSPITAL_COMMUNITY): Payer: Self-pay

## 2023-12-01 ENCOUNTER — Other Ambulatory Visit: Payer: Self-pay

## 2023-12-01 ENCOUNTER — Telehealth: Payer: Self-pay | Admitting: Family Medicine

## 2023-12-01 DIAGNOSIS — M549 Dorsalgia, unspecified: Secondary | ICD-10-CM | POA: Insufficient documentation

## 2023-12-01 DIAGNOSIS — R109 Unspecified abdominal pain: Secondary | ICD-10-CM | POA: Insufficient documentation

## 2023-12-01 DIAGNOSIS — R079 Chest pain, unspecified: Secondary | ICD-10-CM | POA: Diagnosis present

## 2023-12-01 LAB — CBC
HCT: 45.4 % (ref 39.0–52.0)
Hemoglobin: 13.9 g/dL (ref 13.0–17.0)
MCH: 25.4 pg — ABNORMAL LOW (ref 26.0–34.0)
MCHC: 30.6 g/dL (ref 30.0–36.0)
MCV: 82.8 fL (ref 80.0–100.0)
Platelets: 238 10*3/uL (ref 150–400)
RBC: 5.48 MIL/uL (ref 4.22–5.81)
RDW: 14.8 % (ref 11.5–15.5)
WBC: 8.8 10*3/uL (ref 4.0–10.5)
nRBC: 0 % (ref 0.0–0.2)

## 2023-12-01 LAB — BASIC METABOLIC PANEL
Anion gap: 9 (ref 5–15)
BUN: 17 mg/dL (ref 8–23)
CO2: 23 mmol/L (ref 22–32)
Calcium: 8.8 mg/dL — ABNORMAL LOW (ref 8.9–10.3)
Chloride: 107 mmol/L (ref 98–111)
Creatinine, Ser: 1.19 mg/dL (ref 0.61–1.24)
GFR, Estimated: >60 mL/min (ref >60)
Glucose, Bld: 94 mg/dL (ref 70–99)
Potassium: 4 mmol/L (ref 3.5–5.1)
Sodium: 139 mmol/L (ref 135–145)

## 2023-12-01 LAB — TROPONIN I (HIGH SENSITIVITY)
Troponin I (High Sensitivity): 4 ng/L (ref <18)
Troponin I (High Sensitivity): 5 ng/L (ref <18)

## 2023-12-01 MED ORDER — ONDANSETRON HCL 4 MG/2ML IJ SOLN: 4.0000 mg | Freq: Once | INTRAMUSCULAR | Status: DC

## 2023-12-01 MED ORDER — IOHEXOL 350 MG/ML SOLN
100.0000 mL | Freq: Once | INTRAVENOUS | Status: AC | PRN
  Administered 2023-12-01: 100 mL via INTRAVENOUS

## 2023-12-01 MED ORDER — ACETAMINOPHEN 500 MG PO TABS: 1000.0000 mg | ORAL_TABLET | Freq: Once | ORAL | Status: DC

## 2023-12-01 NOTE — Telephone Encounter (Signed)
FYI: This call has been transferred to Access Nurse. Once the result note has been entered staff can address the message at that time.  Patient called in with the following symptoms:  Red Word:chest pain Patient called in and stated that he sneezed and caught a stomach cramp. He stated that he is now having chest and stomach pain, plus a headache. He stated that he had surgery recently.   Please advise at [facility] [phone]  Message is routed to Provider Pool and [facility] Triage

## 2023-12-01 NOTE — Discharge Instructions (Signed)
Given your description of umbilical and direct hernias that are occurring, I recommend you follow-up with a general surgery program phone number listed above for [place] surgery.

## 2023-12-01 NOTE — Telephone Encounter (Signed)
Will await ER report.  Thanks.  

## 2023-12-01 NOTE — ED Notes (Signed)
Patient transported to x-ray. ?

## 2023-12-01 NOTE — ED Triage Notes (Signed)
Patient BIB [facility] from home for CP. Patient sneezed this morning which usually causes abdominal pain since appendectomy. Today pain was much worse, caused him to be diaphoretic, nauseated, and caused midsternal chest pain. The abdominal pain resolved but the chest pain remained. Initial BP 200/100, 2 doses of nitroglycerin and 324mg  aspirin helped relieve pain and decreased BP to 166/84. Currently pain free.

## 2023-12-01 NOTE — ED Provider Notes (Signed)
[place] EMERGENCY DEPARTMENT AT [facility] Provider Note   CSN: [id number] Arrival date & time: 12/01/23  1330     History Chief Complaint  Patient presents with   Chest Pain    HPI Daniel [name] is a 62 y.o. male presenting for chief complaint of severe chest back abdominal pain. At work today and sneezed and suddenly full body pain.  Lasted 30 minutes. Chest/abdomen pain.  Got nauseated and diaphoretic.  Patient's recorded medical, surgical, social, medication list and allergies were reviewed in the Snapshot window as part of the initial history.   Review of Systems   Review of Systems  Constitutional:  Negative for chills and fever.  HENT:  Negative for ear pain and sore throat.   Eyes:  Negative for pain and visual disturbance.  Respiratory:  Positive for shortness of breath. Negative for cough.   Cardiovascular:  Positive for chest pain. Negative for palpitations.  Gastrointestinal:  Positive for abdominal pain, nausea and vomiting. Negative for diarrhea.  Genitourinary:  Negative for dysuria and hematuria.  Musculoskeletal:  Negative for arthralgias and back pain.  Skin:  Negative for color change and rash.  Neurological:  Negative for seizures and syncope.  All other systems reviewed and are negative.   Physical Exam Updated Vital Signs BP (!) 147/99 (BP Location: Right Arm)   Pulse 84   Resp 16   Ht 6' (1.829 m)   Wt 121.1 kg   SpO2 98%   BMI 36.21 kg/m  Physical Exam Vitals and nursing note reviewed.  Constitutional:      General: He is not in acute distress.    Appearance: He is well-developed.  HENT:     Head: Normocephalic and atraumatic.  Eyes:     Conjunctiva/sclera: Conjunctivae normal.  Cardiovascular:     Rate and Rhythm: Normal rate and regular rhythm.     Heart sounds: No murmur heard. Pulmonary:     Effort: Pulmonary effort is normal. No respiratory distress.     Breath sounds: Normal breath sounds.  Abdominal:      Palpations: Abdomen is soft.     Tenderness: There is no abdominal tenderness.  Musculoskeletal:        General: No swelling.     Cervical back: Neck supple.  Skin:    General: Skin is warm and dry.     Capillary Refill: Capillary refill takes less than 2 seconds.  Neurological:     Mental Status: He is alert.  Psychiatric:        Mood and Affect: Mood normal.      ED Course/ Medical Decision Making/ A&P    Procedures Procedures   Medications Ordered in ED Medications - No data to display  Medical Decision Making:    Daniel [name] is a 62 y.o. male who presented to the ED today with diffuse syndrome detailed above.     Additional history discussed with patient's family/caregivers.  Patient placed on continuous vitals and telemetry monitoring while in ED which was reviewed periodically.   Complete initial physical exam performed, notably the patient  was hemodynamically stable no acute distress currently asymptomatic.      Reviewed and confirmed nursing documentation for past medical history, family history, social history.    Initial Assessment:   Patient's history of present illness and physical exam findings are most consistent with nonspecific etiology either muscular or otherwise. However given the diffuse nature of his syndromes, aortic dissection would be on the differential since it involved  with chest abdomen and pelvis. ACS and PE are considered less likely based on the current presentation and spontaneous resolution Will evaluate broadly as below  Initial Plan:  Screening labs including CBC and Metabolic panel to evaluate for infectious or metabolic etiology of disease.  Urinalysis with reflex culture ordered to evaluate for UTI or relevant urologic/nephrologic pathology.  CXR to evaluate for structural/infectious intrathoracic pathology.  Troponins/EKG to evaluate for cardiac pathology. CT chest abdomen pelvis with contrast to evaluate for aortic disease,  hernia, small bowel obstruction, biliary pathology given his history. Objective evaluation as below reviewed with plan for close reassessment  Initial Study Results:   Laboratory  All laboratory results reviewed without evidence of clinically relevant pathology.     EKG EKG was reviewed independently. Rate, rhythm, axis, intervals all examined and without medically relevant abnormality. ST segments without concerns for elevations.    Radiology  All images reviewed independently. Agree with radiology report at this time.   DG Chest 2 View Result Date: 12/01/2023 CLINICAL DATA:  Chest pain. EXAM: CHEST - 2 VIEW COMPARISON:  08/01/2022. FINDINGS: Bilateral lung fields are clear. Bilateral costophrenic angles are clear. Normal cardio-mediastinal silhouette. No acute osseous abnormalities. The soft tissues are within normal limits. IMPRESSION: No active cardiopulmonary disease. Electronically Signed   By: Jules Schick M.D.   On: 12/01/2023 15:32      Reassessment and Plan:   Images with no focal pathology.  Patient is describing a direct hernia currently reduced not visible on images. Will refer to general surgery for outpatient evaluation.  No evidence of further pathology on extensive evaluation today.   Disposition:  I have considered need for hospitalization, however, considering all of the above, I believe this patient is stable for discharge at this time.  Patient/family educated about specific return precautions for given chief complaint and symptoms.  Patient/family educated about follow-up with PCP.     Patient/family expressed understanding of return precautions and need for follow-up. Patient spoken to regarding all imaging and laboratory results and appropriate follow up for these results. All education provided in verbal form with additional information in written form. Time was allowed for answering of patient questions. Patient discharged.    Emergency Department Medication  Summary:   Medications  iohexol (OMNIPAQUE) 350 MG/ML injection 100 mL (100 mLs Intravenous Contrast Given 12/01/23 2215)         Clinical Impression: No diagnosis found.   Data Unavailable   Final Clinical Impression(s) / ED Diagnoses Final diagnoses:  None    Rx / DC Orders ED Discharge Orders     None         Glyn Ade, MD 12/02/23 1504

## 2023-12-01 NOTE — Telephone Encounter (Signed)
Per access nurse note pt agreed to go to ED. Sending note to Dr Damita Dunnings and Damita Dunnings pool.

## 2023-12-13 ENCOUNTER — Other Ambulatory Visit: Payer: Self-pay | Admitting: Family Medicine

## 2023-12-15 NOTE — Telephone Encounter (Signed)
At office visit on 7/2 you requested patient to update in 1 week about blood pressure do not see that information. Ok to refill?

## 2023-12-16 NOTE — Telephone Encounter (Signed)
Unable to reach patient. Left voicemail to return call to our office.   

## 2023-12-16 NOTE — Telephone Encounter (Signed)
 Please get update on patient re: BP and schedule yearly visit.  Thanks.  Rx sent.

## 2023-12-19 NOTE — Telephone Encounter (Signed)
 Left voicemail for patient to return call to office.

## 2023-12-22 NOTE — Telephone Encounter (Signed)
 Please see if he can bring a list of readings to the visit and we'll go from there.  Thanks.

## 2023-12-30 ENCOUNTER — Ambulatory Visit (INDEPENDENT_AMBULATORY_CARE_PROVIDER_SITE_OTHER): Payer: 59 | Admitting: Family Medicine

## 2023-12-30 ENCOUNTER — Encounter: Payer: Self-pay | Admitting: Family Medicine

## 2023-12-30 VITALS — BP 160/80 | HR 77 | Temp 98.2°F | Ht 71.0 in | Wt 277.6 lb

## 2023-12-30 DIAGNOSIS — Z125 Encounter for screening for malignant neoplasm of prostate: Secondary | ICD-10-CM | POA: Diagnosis not present

## 2023-12-30 DIAGNOSIS — K219 Gastro-esophageal reflux disease without esophagitis: Secondary | ICD-10-CM

## 2023-12-30 DIAGNOSIS — Z0000 Encounter for general adult medical examination without abnormal findings: Secondary | ICD-10-CM | POA: Diagnosis not present

## 2023-12-30 DIAGNOSIS — I10 Essential (primary) hypertension: Secondary | ICD-10-CM

## 2023-12-30 DIAGNOSIS — E782 Mixed hyperlipidemia: Secondary | ICD-10-CM

## 2023-12-30 DIAGNOSIS — N289 Disorder of kidney and ureter, unspecified: Secondary | ICD-10-CM

## 2023-12-30 DIAGNOSIS — Z13220 Encounter for screening for lipoid disorders: Secondary | ICD-10-CM | POA: Diagnosis not present

## 2023-12-30 DIAGNOSIS — Z7189 Other specified counseling: Secondary | ICD-10-CM

## 2023-12-30 LAB — LIPID PANEL
Cholesterol: 141 mg/dL (ref 0–200)
HDL: 41.3 mg/dL (ref >39.00)
LDL Cholesterol: 89 mg/dL (ref 0–99)
NonHDL: 99.76
Total CHOL/HDL Ratio: 3
Triglycerides: 53 mg/dL (ref 0.0–149.0)
VLDL: 10.6 mg/dL (ref 0.0–40.0)

## 2023-12-30 LAB — HEPATIC FUNCTION PANEL
ALT: 14 U/L (ref 0–53)
AST: 14 U/L (ref 0–37)
Albumin: 4.1 g/dL (ref 3.5–5.2)
Alkaline Phosphatase: 60 U/L (ref 39–117)
Bilirubin, Direct: 0.1 mg/dL (ref 0.0–0.3)
Total Bilirubin: 0.4 mg/dL (ref 0.2–1.2)
Total Protein: 7.1 g/dL (ref 6.0–8.3)

## 2023-12-30 LAB — PSA: PSA: 0.01 ng/mL — ABNORMAL LOW (ref 0.10–4.00)

## 2023-12-30 MED ORDER — AMLODIPINE BESYLATE 10 MG PO TABS: 10.0000 mg | ORAL_TABLET | Freq: Every day | ORAL | 3 refills | Status: AC

## 2023-12-30 MED ORDER — PRAVASTATIN SODIUM 40 MG PO TABS: 40.0000 mg | ORAL_TABLET | Freq: Every day | ORAL | 3 refills | Status: DC

## 2023-12-30 MED ORDER — LOSARTAN POTASSIUM 100 MG PO TABS: 100.0000 mg | ORAL_TABLET | Freq: Every day | ORAL | 3 refills | Status: DC

## 2023-12-30 MED ORDER — HYDROCHLOROTHIAZIDE 12.5 MG PO CAPS: 12.5000 mg | ORAL_CAPSULE | Freq: Every day | ORAL | 3 refills | Status: DC

## 2023-12-30 MED ORDER — PANTOPRAZOLE SODIUM 40 MG PO TBEC: 40.0000 mg | DELAYED_RELEASE_TABLET | Freq: Every day | ORAL | 3 refills | Status: AC

## 2023-12-30 NOTE — Patient Instructions (Addendum)
 Go to the lab on the way out.   If you have mychart we'll likely use that to update you.    Take care.  Glad to see you. Tetanus shot today.   Add on hydrochlorothiazide and let me know if your BP isn't controlled in about 10 days.

## 2023-12-30 NOTE — Progress Notes (Signed)
 CPE- See plan.  Routine anticipatory guidance given to patient.  See health maintenance.  The possibility exists that previously documented standard health maintenance information may have been brought forward from a previous encounter into this note.  If needed, that same information has been updated to reflect the current situation based on today's encounter.    Tetanus due 2025- out of stock at [facility] and patient can return for RN visit or get at pharmacy.  D/w pt.  [facility]- [facility] PNA due at 65.   Shingles d/w pt.   covid vaccine d/w pt.  Done 2021.  Moderna 02/25/20 and 03/24/20.   HIV and HCV neg 2014 per patient report with the needle stick event 2014 Colonoscopy 2022 PSA pending 2025.  Living will.  Would have his daughter designated if he were incapacitated.   Diet and exercise d/w pt.  Walking at work.    Hypertension:    Using medication without problems or lightheadedness: yes Chest pain with exertion: no Edema:no Short of breath:no Labs pending.   Elevated Cholesterol: Using medications without problems:yes Muscle aches: no Diet compliance: d/w pt.  Exercise: d/w pt.    GERD. Controlled on PPI.  Diet d/w pt.    ER eval.  Abd pain after sneezing, and he felt unwell.   Reassuring exam overall with renal cyst noted.  Feels well now.    D/w pt about imaging. 11 mm hypodensity in the anterior left kidney may represent a cyst or complex cyst. Recommend follow-up renal ultrasound in 6 Months- due June 2025.  I put reminder in the EMR.     PMH and SH reviewed  Meds, vitals, and allergies reviewed.   ROS: Per HPI.  Unless specifically indicated otherwise in HPI, the patient denies:  General: fever. Eyes: acute vision changes ENT: sore throat Cardiovascular: chest pain Respiratory: SOB GI: vomiting GU: dysuria Musculoskeletal: acute back pain Derm: acute rash Neuro: acute motor dysfunction Psych: worsening mood Endocrine: polydipsia Heme:  bleeding Allergy: hayfever  GEN: nad, alert and oriented HEENT: ncat NECK: supple w/o LA CV: rrr. PULM: ctab, no inc wob ABD: soft, +bs EXT: no edema SKIN: no acute rash

## 2023-12-31 DIAGNOSIS — N289 Disorder of kidney and ureter, unspecified: Secondary | ICD-10-CM | POA: Insufficient documentation

## 2023-12-31 NOTE — Assessment & Plan Note (Signed)
 Controlled on PPI.  Diet d/w pt.   Continue as is.

## 2023-12-31 NOTE — Assessment & Plan Note (Signed)
 D/w pt about imaging. 11 mm hypodensity in the anterior left kidney may represent a cyst or complex cyst. Recommend follow-up renal ultrasound in 6 Months- due June 2025.  I put reminder in the EMR.

## 2023-12-31 NOTE — Assessment & Plan Note (Signed)
 See notes on labs.  Add on hydrochlorothiazide  and let me know if BP isn't controlled in about 10 days.

## 2023-12-31 NOTE — Assessment & Plan Note (Signed)
 Continue work on diet and exercise, continue pravastatin .

## 2023-12-31 NOTE — Assessment & Plan Note (Deleted)
Living will.  Would have his daughter designated if he were incapacitated.   

## 2023-12-31 NOTE — Assessment & Plan Note (Signed)
Living will.  Would have his daughter designated if he were incapacitated.   

## 2023-12-31 NOTE — Assessment & Plan Note (Signed)
 Tetanus due 2025- out of stock at [facility] and patient can return for RN visit or get at pharmacy.  D/w pt.  [facility] PNA due at 65.   Shingles d/w pt.   covid vaccine d/w pt.  Done 2021.  Moderna 02/25/20 and 03/24/20.   HIV and HCV neg 2014 per patient report with the needle stick event 2014 Colonoscopy 2022 PSA pending 2025.  Living will.  Would have his daughter designated if he were incapacitated.   Diet and exercise d/w pt.  Walking at work.

## 2024-01-01 ENCOUNTER — Ambulatory Visit (INDEPENDENT_AMBULATORY_CARE_PROVIDER_SITE_OTHER): Payer: 59

## 2024-01-01 DIAGNOSIS — Z23 Encounter for immunization: Secondary | ICD-10-CM | POA: Diagnosis not present

## 2024-01-01 NOTE — Progress Notes (Signed)
Per orders of Dr. Crawford Givens, injection of tdap given by Lewanda Rife in left deltoid. Patient tolerated injection well.

## 2024-01-04 ENCOUNTER — Encounter: Payer: Self-pay | Admitting: Family Medicine

## 2024-01-09 ENCOUNTER — Other Ambulatory Visit: Payer: Self-pay | Admitting: Family Medicine

## 2024-01-09 MED ORDER — HYDROCHLOROTHIAZIDE 12.5 MG PO CAPS: 25.0000 mg | ORAL_CAPSULE | Freq: Every day | ORAL | Status: DC

## 2024-01-22 ENCOUNTER — Telehealth: Payer: Self-pay | Admitting: Family Medicine

## 2024-01-22 ENCOUNTER — Telehealth: Payer: Self-pay

## 2024-01-22 MED ORDER — HYDROCHLOROTHIAZIDE 25 MG PO TABS: 25.0000 mg | ORAL_TABLET | Freq: Every day | ORAL | 3 refills | Status: AC

## 2024-01-22 NOTE — Telephone Encounter (Signed)
 Copied from CRM [id number]. Topic: Clinical - Prescription Issue >> Jan 22, 2024  3:05 PM Erling Hawthorne B wrote: Pt stated that he is needing a new prescription for hydrochlorothiazide  (MICROZIDE ) 12.5 MG capsule stating to take  2x a day instead of one.

## 2024-01-22 NOTE — Addendum Note (Signed)
 Addended by: Donnie Galea on: 01/22/2024 04:34 PM   Modules accepted: Orders

## 2024-01-22 NOTE — Telephone Encounter (Signed)
 Copied from CRM [id number]. Topic: General - Other >> Jan 22, 2024  3:08 PM Joanell B wrote: Reason for CRM: Pt stated that his PCP asked him to give him an update on how his blood pressure has been doing while on his fluid pills hydrochlorothiazide  (MICROZIDE ) 12.5 MG capsule. Pt stated that his blood pressure has been between 130/65 and 130/60.

## 2024-01-22 NOTE — Telephone Encounter (Signed)
 I sent it for 25mg  tab so he'll only have to take 1 a day but it is rx'd as the higher strength.  Thanks.  See other phone note.

## 2024-01-22 NOTE — Telephone Encounter (Signed)
 Thanks for the update.  Glad BP is controlled.  See other phone note re: refill.  Thanks.

## 2024-01-23 NOTE — Telephone Encounter (Signed)
 Patient notified

## 2024-02-23 ENCOUNTER — Ambulatory Visit (INDEPENDENT_AMBULATORY_CARE_PROVIDER_SITE_OTHER)
Admission: RE | Admit: 2024-02-23 | Discharge: 2024-02-23 | Disposition: A | Discharge status: Home / Self Care | Source: Ambulatory Visit | Attending: Family Medicine | Admitting: Family Medicine

## 2024-02-23 ENCOUNTER — Encounter: Payer: Self-pay | Admitting: Family Medicine

## 2024-02-23 ENCOUNTER — Ambulatory Visit: Admitting: Family Medicine

## 2024-02-23 VITALS — BP 152/102 | HR 88 | Temp 98.5°F | Ht 71.0 in | Wt 275.8 lb

## 2024-02-23 DIAGNOSIS — M2391 Unspecified internal derangement of right knee: Secondary | ICD-10-CM | POA: Diagnosis not present

## 2024-02-23 NOTE — Progress Notes (Unsigned)
 Right knee. Patient states that on Thursday his knee locked up, in extension.  No trigger, no trauma.  No fall but nearly fell when it happened.  It was stuck, painful, then eased back into a chair.  Stayed painful for about 15 minutes.  It eventually eased off.  Not puffy or bruised.  Was sore upon standing again.  He stayed off his leg for the rest of the day.  No pain in the meantime.  No pain now.  No locking in the meantime.   H/o steroid knee injection years ago.    BP has been max 140 systolic at home.    Meds, vitals, and allergies reviewed.   ROS: Per HPI unless specifically indicated in ROS section   He has normal ROM R knee now.

## 2024-02-23 NOTE — Patient Instructions (Signed)
Xray on the way out.  We'll be in touch . Take care.  Glad to see you.

## 2024-02-25 ENCOUNTER — Telehealth: Payer: Self-pay | Admitting: Family Medicine

## 2024-02-25 DIAGNOSIS — M2391 Unspecified internal derangement of right knee: Secondary | ICD-10-CM

## 2024-02-25 NOTE — Telephone Encounter (Signed)
 Please update patient.  It looks like he has some arthritic changes on the medial/inside portion of the knees bilaterally.  The medial section is where people tend to have arthritic changes first.  It looks like he has some mild arthritic changes at the knee.  I think there is a good chance he could have had knee locking related to meniscal changes.  If he wants to see orthopedics then I think we should go ahead and set this up.  He could wait and see how he did in the meantime, if he wants to do that.  If he has any further episodes then I think we need to send him to orthopedics at that point.  If he wants me to go ahead and set it up, I can get it started.  Please let me know about his thoughts.  Thanks.

## 2024-02-25 NOTE — Assessment & Plan Note (Signed)
 No symptoms now.  The concern is for meniscal abnormality.  General anatomy discussed with patient, using diagrams.  Reasonable to check plain films today.  See notes on imaging.  Okay for outpatient follow-up.  He agrees to plan.

## 2024-02-26 ENCOUNTER — Encounter: Payer: Self-pay | Admitting: Family Medicine

## 2024-02-26 NOTE — Telephone Encounter (Signed)
 Done. Thanks.

## 2024-02-26 NOTE — Telephone Encounter (Signed)
 Called and spoke with patient, reviewed all information. He verbalized understanding. Patient would like to go ahead and have orthopedic referral placed, any location is fine with him. Advised if he has not heard anything within 1-2 weeks to reach out to our clinic.

## 2024-03-11 ENCOUNTER — Encounter: Payer: Self-pay | Admitting: Orthopaedic Surgery

## 2024-03-11 ENCOUNTER — Ambulatory Visit: Admitting: Orthopaedic Surgery

## 2024-03-11 DIAGNOSIS — G8929 Other chronic pain: Secondary | ICD-10-CM

## 2024-03-11 DIAGNOSIS — M25561 Pain in right knee: Secondary | ICD-10-CM | POA: Diagnosis not present

## 2024-03-11 MED ORDER — LIDOCAINE HCL 1 % IJ SOLN
2.0000 mL | INTRAMUSCULAR | Status: AC | PRN
  Administered 2024-03-11: 2 mL

## 2024-03-11 MED ORDER — METHYLPREDNISOLONE ACETATE 40 MG/ML IJ SUSP
40.0000 mg | INTRAMUSCULAR | Status: AC | PRN
  Administered 2024-03-11: 40 mg via INTRA_ARTICULAR

## 2024-03-11 MED ORDER — BUPIVACAINE HCL 0.5 % IJ SOLN
2.0000 mL | INTRAMUSCULAR | Status: AC | PRN
  Administered 2024-03-11: 2 mL via INTRA_ARTICULAR

## 2024-03-11 NOTE — Progress Notes (Signed)
 Office Visit Note   Patient: Daniel [name]           Date of Birth: [birth year]           MRN: [id number] Visit Date: 03/11/2024              Requested by: Joaquim Nam, MD [address] [place],  [place] [address] PCP: Joaquim Nam, MD   Assessment & Plan: Visit Diagnoses:  1. Chronic pain of right knee     Plan: Mr. Heffner is a 63 year old gentleman with right knee pain probable osteoarthritis flare with effusion.  The effusion was aspirated cortisone injected.  30 cc was was aspirated and sent to the lab.  Patient tolerated this well.  Will see him back as needed.  Follow-Up Instructions: No follow-ups on file.   Orders:  Orders Placed This Encounter  Procedures   Synovial Fluid Analysis, Complete   No orders of the defined types were placed in this encounter.     Procedures: Large Joint Inj: R knee on 03/11/2024 2:38 PM Indications: pain Details: 22 G needle  Arthrogram: No  Medications: 40 mg methylPREDNISolone acetate 40 MG/ML; 2 mL lidocaine 1 %; 2 mL bupivacaine 0.5 % Aspirate: 30 mL; sent for lab analysis Outcome: tolerated well, no immediate complications Consent was given by the patient. Patient was prepped and draped in the usual sterile fashion.       Clinical Data: No additional findings.   Subjective: Chief Complaint  Patient presents with   Right Knee - Pain    HPI Daniel [name] is a very pleasant 63 year old gentleman comes in for right knee pain and swelling.  Reports swelling.  Denies any injuries.  Had x-rays by his PCP which showed moderate osteoarthritis. Review of Systems  Constitutional: Negative.   HENT: Negative.    Eyes: Negative.   Respiratory: Negative.    Cardiovascular: Negative.   Gastrointestinal: Negative.   Endocrine: Negative.   Genitourinary: Negative.   Skin: Negative.   Allergic/Immunologic: Negative.   Neurological: Negative.   Hematological: Negative.   Psychiatric/Behavioral: Negative.    All  other systems reviewed and are negative.    Objective: Vital Signs: There were no vitals taken for this visit.  Physical Exam Vitals and nursing note reviewed.  Constitutional:      Appearance: He is well-developed.  HENT:     Head: Normocephalic and atraumatic.  Eyes:     Pupils: Pupils are equal, round, and reactive to light.  Pulmonary:     Effort: Pulmonary effort is normal.  Abdominal:     Palpations: Abdomen is soft.  Musculoskeletal:        General: Normal range of motion.     Cervical back: Neck supple.  Skin:    General: Skin is warm.  Neurological:     Mental Status: He is alert and oriented to person, place, and time.  Psychiatric:        Behavior: Behavior normal.        Thought Content: Thought content normal.        Judgment: Judgment normal.     Ortho Exam Exam of the right knee shows large joint effusion.  Range of motion is mildly painful.  Collaterals and cruciates are stable. Specialty Comments:  No specialty comments available.  Imaging: No results found.   PMFS History: Patient Active Problem List   Diagnosis Date Noted   Knee locking, right 02/23/2024   Renal lesion 12/31/2023   Headache 09/28/2023  Generalized abdominal pain 08/29/2023   Dilated gallbladder 08/29/2023   Edema 06/18/2023   Shortness of breath 08/01/2022   Dysuria 07/15/2021   Prostate cancer (HCC) 02/14/2019   Hyperglycemia 02/07/2019   Pulmonary nodule 01/31/2019   Chest pain 01/27/2019   Cough 02/03/2018   Pain in both knees 09/15/2017   Advance care planning 12/25/2015   Sciatica 08/09/2014   Esophageal stricture 11/08/2013   Syncope 12/06/2011   Atypical chest pain 11/28/2011   Routine general medical examination at a health care facility 10/18/2011   History of colonic polyps 10/18/2011   GERD (gastroesophageal reflux disease) 06/23/2011   Obesity 04/27/2009   ERECTILE DYSFUNCTION 05/12/2008   HYPERLIPIDEMIA 12/31/2007   Hypertension 11/27/2007   Past  Medical History:  Diagnosis Date   Abnormal EKG    AC (acromioclavicular) joint bone spurs    degenerative hypertrophic spurs in the lower thoracic and mid lumbar spine   Chest pain    12/12:  Lexiscan Myoview demonstrated no ischemia or scar.  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 every once in a while  (he is an exPsychologist, sport and exercise)   Prostate cancer (HCC)    Tubular adenoma of colon 11/2011   Villous adenoma of right colon 03/12/2012   Laparoscopic right colectomy 02/25/2012     Family History  Problem Relation Age of Onset   Heart disease Mother 41       MI, died at age 77   Hypertension Mother    Coronary artery disease Mother    Alcohol abuse Father        cirrhoisis of liver-- ETOH abuse   Heart disease Brother    Colon cancer Neg Hx    Prostate cancer Neg Hx    Esophageal cancer Neg Hx    Rectal cancer Neg Hx    Stomach cancer Neg Hx     Past Surgical History:  Procedure Laterality Date   APPENDECTOMY  2013   COLON RESECTION  02/25/2012   Procedure: LAPAROSCOPIC RIGHT COLON RESECTION;  Surgeon: Ernestene Mention, MD;  Location: [facility];  Service: General;  Laterality: Right;  laparoscopic assisted right colectomy   COLONOSCOPY  11/21/11   LAPAROSCOPIC RIGHT COLON RESECTION  2013   adenomatous polyp   LEFT HEART CATH AND CORONARY ANGIOGRAPHY N/A 01/28/2019   Procedure: LEFT HEART CATH AND CORONARY  ANGIOGRAPHY;  Surgeon: Marykay Lex, MD;  Location: [facility] INVASIVE CV LAB;  Service: Cardiovascular;  Laterality: N/A;   LYMPHADENECTOMY Bilateral 04/22/2019   Procedure: LYMPHADENECTOMY, PELVIC;  Surgeon: Heloise Purpura, MD;  Location: [facility];  Service: Urology;  Laterality: Bilateral;   PROSTATE BIOPSY     ROBOT ASSISTED LAPAROSCOPIC RADICAL PROSTATECTOMY N/A 04/22/2019   Procedure: XI ROBOTIC ASSISTED LAPAROSCOPIC RADICAL PROSTATECTOMY LEVEL 3;  Surgeon: Heloise Purpura, MD;  Location:  [facility];  Service: Urology;  Laterality: N/A;  ONLY NEEDS 210 MIN FOR ALL PROCEDURES   Social History   Occupational History   Occupation: Water quality scientist AND REC    Employer: UNEMPLOYED  Tobacco Use   Smoking status: Never   Smokeless tobacco: Never  Vaping Use   Vaping status: Never Used  Substance and Sexual Activity   Alcohol use: No   Drug use: No   Sexual activity: Yes

## 2024-03-12 LAB — SYNOVIAL FLUID ANALYSIS, COMPLETE
Basophils, %: 0 %
Eosinophils-Synovial: 0 % (ref 0–2)
Lymphocytes-Synovial Fld: 32 % (ref 0–74)
Monocyte/Macrophage: 35 % (ref 0–69)
Neutrophil, Synovial: 29 % — ABNORMAL HIGH (ref 0–24)
Synoviocytes, %: 4 % (ref 0–15)
WBC, Synovial: 1176 {cells}/uL — ABNORMAL HIGH (ref <150)

## 2024-03-14 ENCOUNTER — Encounter: Payer: Self-pay | Admitting: Family Medicine

## 2024-04-06 ENCOUNTER — Ambulatory Visit: Payer: Self-pay

## 2024-04-06 ENCOUNTER — Ambulatory Visit (INDEPENDENT_AMBULATORY_CARE_PROVIDER_SITE_OTHER)
Admission: RE | Admit: 2024-04-06 | Discharge: 2024-04-06 | Disposition: A | Discharge status: Home / Self Care | Source: Ambulatory Visit | Attending: Family Medicine | Admitting: Family Medicine

## 2024-04-06 ENCOUNTER — Ambulatory Visit: Admitting: Family Medicine

## 2024-04-06 ENCOUNTER — Encounter: Payer: Self-pay | Admitting: Family Medicine

## 2024-04-06 VITALS — BP 146/94 | HR 73 | Temp 98.6°F | Ht 71.0 in | Wt 275.5 lb

## 2024-04-06 DIAGNOSIS — R051 Acute cough: Secondary | ICD-10-CM

## 2024-04-06 DIAGNOSIS — R0789 Other chest pain: Secondary | ICD-10-CM | POA: Diagnosis not present

## 2024-04-06 DIAGNOSIS — I10 Essential (primary) hypertension: Secondary | ICD-10-CM

## 2024-04-06 LAB — CBC WITH DIFFERENTIAL/PLATELET
Basophils Absolute: 0 10*3/uL (ref 0.0–0.1)
Basophils Relative: 0.6 % (ref 0.0–3.0)
Eosinophils Absolute: 0.2 10*3/uL (ref 0.0–0.7)
Eosinophils Relative: 2.2 % (ref 0.0–5.0)
HCT: 45.6 % (ref 39.0–52.0)
Hemoglobin: 14.7 g/dL (ref 13.0–17.0)
Lymphocytes Relative: 37.3 % (ref 12.0–46.0)
Lymphs Abs: 2.6 10*3/uL (ref 0.7–4.0)
MCHC: 32.3 g/dL (ref 30.0–36.0)
MCV: 79.9 fl (ref 78.0–100.0)
Monocytes Absolute: 0.6 10*3/uL (ref 0.1–1.0)
Monocytes Relative: 8.1 % (ref 3.0–12.0)
Neutro Abs: 3.6 10*3/uL (ref 1.4–7.7)
Neutrophils Relative %: 51.8 % (ref 43.0–77.0)
Platelets: 216 10*3/uL (ref 150.0–400.0)
RBC: 5.7 Mil/uL (ref 4.22–5.81)
RDW: 15.1 % (ref 11.5–15.5)
WBC: 6.9 10*3/uL (ref 4.0–10.5)

## 2024-04-06 LAB — COMPREHENSIVE METABOLIC PANEL WITH GFR
ALT: 16 U/L (ref 0–53)
AST: 14 U/L (ref 0–37)
Albumin: 4.3 g/dL (ref 3.5–5.2)
Alkaline Phosphatase: 55 U/L (ref 39–117)
BUN: 17 mg/dL (ref 6–23)
CO2: 31 meq/L (ref 19–32)
Calcium: 9.5 mg/dL (ref 8.4–10.5)
Chloride: 99 meq/L (ref 96–112)
Creatinine, Ser: 1.2 mg/dL (ref 0.40–1.50)
GFR: 64.62 mL/min (ref >60.00)
Glucose, Bld: 95 mg/dL (ref 70–99)
Potassium: 3.9 meq/L (ref 3.5–5.1)
Sodium: 137 meq/L (ref 135–145)
Total Bilirubin: 0.5 mg/dL (ref 0.2–1.2)
Total Protein: 7.8 g/dL (ref 6.0–8.3)

## 2024-04-06 LAB — SEDIMENTATION RATE: Sed Rate: 37 mm/h — ABNORMAL HIGH (ref 0–20)

## 2024-04-06 LAB — TSH: TSH: 1.49 u[IU]/mL (ref 0.35–5.50)

## 2024-04-06 LAB — C-REACTIVE PROTEIN: CRP: <1 mg/dL (ref 0.5–20.0)

## 2024-04-06 LAB — TROPONIN I (HIGH SENSITIVITY): High Sens Troponin I: 6 ng/L (ref 2–17)

## 2024-04-06 MED ORDER — BENZONATATE 100 MG PO CAPS: 100.0000 mg | ORAL_CAPSULE | Freq: Three times a day (TID) | ORAL | 1 refills | Status: AC | PRN

## 2024-04-06 NOTE — Progress Notes (Unsigned)
 Ph: [phone] Fax: [phone]   Patient ID: Daniel [name], male    DOB: 02-Apr-1961, 63 y.o.   MRN: [id number]  This visit was conducted in person.  BP (!) 146/94 (BP Location: Right Arm, Cuff Size: Large)   Pulse 73   Temp 98.6 F (37 C) (Oral)   Ht 5\' 11"  (1.803 m)   Wt 275 lb 8 oz (125 kg)   SpO2 96%   BMI 38.42 kg/m   BP Readings from Last 3 Encounters:  04/06/24 (!) 146/94  02/23/24 (!) 152/102  12/30/23 (!) 160/80    CC: cough, chest pain  Subjective:   HPI: Daniel [name] is a 63 y.o. male presenting on 04/06/2024 for Cough (C/o cough and chest pain. Seen on 12/01/23 at [facility] ED, dx chest pain. Pain is in mid chest. )   Sunday had episode of chest discomfort associated with diaphoresis while in kitchen, this again happened while at church. Again today has had intermittent chest discomfort throughout the day, but no sweating. Chest discomfort is associated with mildly productive cough, described as dull ache. No coughing otherwise. Discomfort reproducible with cough but no pleurisy. Pain brings on cough.  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 Pain does improve with rest. Pain is located substernal. No pain down left arm or jaw. No nausea with this.   Denies fevers/chills, ST, body aches, ear or tooth pain, sinus pressure, chest or head congestion. No reproducible chest wall pain. Denies inciting trauma/injury or falls. No pedal edema.    Daughter currently in the hospital for cardiac arrhythmia issues.   No known h/o asthma, COPD.  He is a non smoker.   Similar chest pain occurred 11/2023 - at that time evaluated at ER with reassuring CXR, EKG, troponins, CT angio chest/abd/pelvis - no abnormal musculoskeletal findings at that time.   H/o GERD overall well managed with pantoprazole  40mg  daily.  EGD 2014 - GEJ stricture s/p dilation, normal biopsies at that time.   HTN managed with amlodipine , losartan , hydrochlorothiazide .  HLD  managed with pravastatin  40mg  daily.  He also takes aspirin  81mg  daily.  Lab Results  Component Value Date   NA 137 04/06/2024   CL 99 04/06/2024   K 3.9 04/06/2024   CO2 31 04/06/2024   BUN 17 04/06/2024   CREATININE 1.20 04/06/2024   GFRNONAA >60 12/01/2023   CALCIUM 9.5 04/06/2024   ALBUMIN 4.3 04/06/2024   GLUCOSE 95 04/06/2024    Lab Results  Component Value Date   CHOL 141 12/30/2023   HDL 41.30 12/30/2023   LDLCALC 89 12/30/2023   LDLDIRECT 168.6 07/12/2013   TRIG 53.0 12/30/2023   CHOLHDL 3 12/30/2023    Lab Results  Component Value Date   TSH 1.49 04/06/2024       Relevant past medical, surgical, family and social history reviewed and updated as indicated. Interim medical history since our last visit reviewed. Allergies and medications reviewed and updated. Outpatient Medications Prior to Visit  Medication Sig Dispense Refill   amLODipine  (NORVASC ) 10 MG tablet Take 1 tablet (10 mg total) by mouth daily. 90 tablet 3   aspirin  EC 81 MG tablet Take 81 mg by mouth daily. Swallow whole.     hydrochlorothiazide  (HYDRODIURIL ) 25 MG tablet Take 1 tablet (25 mg total) by mouth daily. 90 tablet 3   losartan  (COZAAR ) 100 MG tablet Take 1 tablet (100 mg total) by mouth daily. 90 tablet 3   pantoprazole  (PROTONIX ) 40 MG  tablet Take 1 tablet (40 mg total) by mouth daily. 90 tablet 3   pravastatin  (PRAVACHOL ) 40 MG tablet Take 1 tablet (40 mg total) by mouth daily. 90 tablet 3   No facility-administered medications prior to visit.     Per HPI unless specifically indicated in ROS section below Review of Systems  Objective:  BP (!) 146/94 (BP Location: Right Arm, Cuff Size: Large)   Pulse 73   Temp 98.6 F (37 C) (Oral)   Ht 5\' 11"  (1.803 m)   Wt 275 lb 8 oz (125 kg)   SpO2 96%   BMI 38.42 kg/m   Wt Readings from Last 3 Encounters:  04/06/24 275 lb 8 oz (125 kg)  02/23/24 275 lb 12.8 oz (125.1 kg)  12/30/23 277 lb 9.6 oz (125.9 kg)      Physical Exam Vitals  and nursing note reviewed.  Constitutional:      Appearance: Normal appearance. He is not ill-appearing.  HENT:     Mouth/Throat:     Mouth: Mucous membranes are moist.     Pharynx: Oropharynx is clear. No oropharyngeal exudate or posterior oropharyngeal erythema.  Eyes:     Extraocular Movements: Extraocular movements intact.     Conjunctiva/sclera: Conjunctivae normal.     Pupils: Pupils are equal, round, and reactive to light.  Neck:     Thyroid : No thyroid  mass or thyromegaly.  Cardiovascular:     Rate and Rhythm: Normal rate and regular rhythm.     Pulses: Normal pulses.     Heart sounds: Normal heart sounds. No murmur heard. Pulmonary:     Effort: Pulmonary effort is normal. No respiratory distress.     Breath sounds: Normal breath sounds. No wheezing, rhonchi or rales.  Chest:     Chest wall: Tenderness present.       Comments: Reproducible chest wall pain to palpation at sternum Musculoskeletal:     Cervical back: Normal range of motion and neck supple.     Right lower leg: No edema.     Left lower leg: No edema.  Skin:    General: Skin is warm and dry.     Findings: No rash.  Neurological:     Mental Status: He is alert.  Psychiatric:        Mood and Affect: Mood normal.        Behavior: Behavior normal.       Results for orders placed or performed in visit on 04/06/24  Comprehensive metabolic panel with GFR   Collection Time: 04/06/24  2:54 PM  Result Value Ref Range   Sodium 137 135 - 145 mEq/L   Potassium 3.9 3.5 - 5.1 mEq/L   Chloride 99 96 - 112 mEq/L   CO2 31 19 - 32 mEq/L   Glucose, Bld 95 70 - 99 mg/dL   BUN 17 6 - 23 mg/dL   Creatinine, Ser 7.82 0.40 - 1.50 mg/dL   Total Bilirubin 0.5 0.2 - 1.2 mg/dL   Alkaline Phosphatase 55 39 - 117 U/L   AST 14 0 - 37 U/L   ALT 16 0 - 53 U/L   Total Protein 7.8 6.0 - 8.3 g/dL   Albumin 4.3 3.5 - 5.2 g/dL   GFR 95.62 >13.08 mL/min   Calcium 9.5 8.4 - 10.5 mg/dL  TSH   Collection Time: 04/06/24  2:54 PM   Result Value Ref Range   TSH 1.49 0.35 - 5.50 uIU/mL  CBC with Differential/Platelet   Collection Time: 04/06/24  2:54 PM  Result Value Ref Range   WBC 6.9 4.0 - 10.5 K/uL   RBC 5.70 4.22 - 5.81 Mil/uL   Hemoglobin 14.7 13.0 - 17.0 g/dL   HCT 16.1 09.6 - 04.5 %   MCV 79.9 78.0 - 100.0 fl   MCHC 32.3 30.0 - 36.0 g/dL   RDW 40.9 81.1 - 91.4 %   Platelets 216.0 150.0 - 400.0 K/uL   Neutrophils Relative % 51.8 43.0 - 77.0 %   Lymphocytes Relative 37.3 12.0 - 46.0 %   Monocytes Relative 8.1 3.0 - 12.0 %   Eosinophils Relative 2.2 0.0 - 5.0 %   Basophils Relative 0.6 0.0 - 3.0 %   Neutro Abs 3.6 1.4 - 7.7 K/uL   Lymphs Abs 2.6 0.7 - 4.0 K/uL   Monocytes Absolute 0.6 0.1 - 1.0 K/uL   Eosinophils Absolute 0.2 0.0 - 0.7 K/uL   Basophils Absolute 0.0 0.0 - 0.1 K/uL  Sedimentation rate   Collection Time: 04/06/24  2:54 PM  Result Value Ref Range   Sed Rate 37 (H) 0 - 20 mm/hr  C-reactive protein   Collection Time: 04/06/24  2:54 PM  Result Value Ref Range   CRP <1.0 0.5 - 20.0 mg/dL  Troponin I (High Sensitivity)   Collection Time: 04/06/24  3:23 PM  Result Value Ref Range   High Sens Troponin I 6 2 - 17 ng/L   EKG today - NSR rate 60, normal axis, intervals, no hypertrophy, nonspecific noncontiguous ST changes leads III and V1, overall unchanged from prior  Assessment & Plan:   Problem List Items Addressed This Visit     Hypertension   Chronic, stable to mildly elevated on current 3 drug regimen - continue.      Relevant Orders   EKG 12-Lead (Completed)   Cough   Lungs clear - update CXR.  See above.  Rx tessalon  perls for cough.       Chest pain - Primary   Overall atypical chest pain.  Reproducible chest wall pain points to MSK cause, exacerbated with coughing. ?sternalis syndrome. Recommend voltaren gel, ice/heat, tylenol  PRN.  Check chest and sternal xray, EKG today, labwork for further evaluation of inflammatory cause.  If not improved, rec f/u with PCP for  further evaluation. Pt agrees with plan.       Relevant Orders   DG Chest 2 View   DG Sternum   Comprehensive metabolic panel with GFR (Completed)   TSH (Completed)   CBC with Differential/Platelet (Completed)   Sedimentation rate (Completed)   C-reactive protein (Completed)   EKG 12-Lead (Completed)   Troponin I (High Sensitivity) (Completed)     Meds ordered this encounter  Medications   benzonatate  (TESSALON  PERLES) 100 MG capsule    Sig: Take 1 capsule (100 mg total) by mouth 3 (three) times daily as needed for cough (swallow, don't chew).    Dispense:  30 capsule    Refill:  1    Orders Placed This Encounter  Procedures   DG Chest 2 View    Standing Status:   Future    Number of Occurrences:   1    Expiration Date:   04/06/2025    Reason for Exam (SYMPTOM  OR DIAGNOSIS REQUIRED):   chest pain with cough    Preferred imaging location?:   [facility]   DG Sternum    Standing Status:   Future    Number of Occurrences:   1    Expiration  Date:   04/06/2025    Reason for Exam (SYMPTOM  OR DIAGNOSIS REQUIRED):   reproducible sternal pain    Preferred imaging location?:   [facility]   Comprehensive metabolic panel with GFR   TSH   CBC with Differential/Platelet   Sedimentation rate   C-reactive protein   EKG 12-Lead    Patient Instructions  EKG today Xrays today  Labs today  You are tender at sternum bone - possibly straining from coughing or possibly inflammation of the bone. Treat with topical voltaren gel over the counter - pea sized amount up to 3 times a day as needed for pain.  Gentle stretching of chest wall  Ice or heating pad covered in a towel applied to skin 15 min at a time.  If no better, return to see Dr Vallarie Gauze for re evaluation.   Follow up plan: Return if symptoms worsen or fail to improve.  Claire Crick, MD

## 2024-04-06 NOTE — Telephone Encounter (Signed)
 Noted and thanks.

## 2024-04-06 NOTE — Assessment & Plan Note (Addendum)
 Lungs clear - update CXR.  See above.  Rx tessalon  perls for cough.

## 2024-04-06 NOTE — Assessment & Plan Note (Addendum)
 Overall atypical chest pain.  Reproducible chest wall pain points to MSK cause, exacerbated with coughing. ?sternalis syndrome. Recommend voltaren gel, ice/heat, tylenol  PRN.  Check chest and sternal xray, EKG today, labwork for further evaluation of inflammatory cause.  If not improved, rec f/u with PCP for further evaluation. Pt agrees with plan.

## 2024-04-06 NOTE — Patient Instructions (Addendum)
 EKG today Xrays today  Labs today  You are tender at sternum bone - possibly straining from coughing or possibly inflammation of the bone. Treat with topical voltaren gel over the counter - pea sized amount up to 3 times a day as needed for pain.  Gentle stretching of chest wall  Ice or heating pad covered in a towel applied to skin 15 min at a time.  If no better, return to see Dr Vallarie Gauze for re evaluation.

## 2024-04-06 NOTE — Telephone Encounter (Signed)
  Chief Complaint: Chest pain/cough Symptoms: above Frequency: 1 month Pertinent Negatives: Patient denies radiation of pain Disposition: [] ED /[] Urgent Care (no appt availability in office) / [x] Appointment(In office/virtual)/ []  [place] [facility]/ [] Home Care/ [] Refused Recommended Disposition /[] [place] [facility]/ []  Follow-up with PCP Additional Notes: Pt reports chest pain followed bu cough. Pt states he has been seen for this in the past at ED and was released w/o further treatment. Pt does not want to go back to ED as he was there a very long time last time.  Scheduled appt in office.    Copied from CRM [id number]. Topic: Clinical - Red Word Triage >> Apr 06, 2024  8:06 AM Jorie Newness J wrote: [facility] that prompted transfer to Nurse Triage: Severe chest pains with a bad cough, fatigue Reason for Disposition  [1] Chest pain lasts > 5 minutes AND [2] occurred in past 3 days (72 hours) (Exception: Feels exactly the same as previously diagnosed heartburn and has accompanying sour taste in mouth.)  Answer Assessment - Initial Assessment Questions 1. LOCATION: "Where does it hurt?"       Center of chest 2. RADIATION: "Does the pain go anywhere else?" (e.g., into neck, jaw, arms, back)     no 3. ONSET: "When did the chest pain begin?" (Minutes, hours or days)      1 month a go 4. PATTERN: "Does the pain come and go, or has it been constant since it started?"  "Does it get worse with exertion?"      Comes and goes 5. DURATION: "How long does it last" (e.g., seconds, minutes, hours)     Pain 5-10 minutes 6. SEVERITY: "How bad is the pain?"  (e.g., Scale 1-10; mild, moderate, or severe)    - MILD (1-3): doesn't interfere with normal activities     - MODERATE (4-7): interferes with normal activities or awakens from sleep    - SEVERE (8-10): excruciating pain, unable to do any normal activities       7/10 7. CARDIAC RISK FACTORS: "Do you have any history of heart problems or risk  factors for heart disease?" (e.g., angina, prior heart attack; diabetes, high blood pressure, high cholesterol, smoker, or strong family history of heart disease)    htn 8. PULMONARY RISK FACTORS: "Do you have any history of lung disease?"  (e.g., blood clots in lung, asthma, emphysema, birth control pills)     unknown 9. CAUSE: "What do you think is causing the chest pain?"     No idea 10. OTHER SYMPTOMS: "Do you have any other symptoms?" (e.g., dizziness, nausea, vomiting, sweating, fever, difficulty breathing, cough)       Sweaty - cough from pain  Protocols used: Chest Pain-A-AH

## 2024-04-06 NOTE — Telephone Encounter (Signed)
 Will see today at 2pm

## 2024-04-06 NOTE — Assessment & Plan Note (Signed)
 Chronic, stable to mildly elevated on current 3 drug regimen - continue.

## 2024-04-07 ENCOUNTER — Encounter: Payer: Self-pay | Admitting: Family Medicine

## 2024-04-08 ENCOUNTER — Encounter: Payer: Self-pay | Admitting: Family Medicine

## 2024-04-09 ENCOUNTER — Ambulatory Visit: Payer: Self-pay

## 2024-04-09 ENCOUNTER — Emergency Department (HOSPITAL_BASED_OUTPATIENT_CLINIC_OR_DEPARTMENT_OTHER)
Admission: EM | Admit: 2024-04-09 | Discharge: 2024-04-09 | Disposition: A | Discharge status: Home / Self Care | Attending: Emergency Medicine | Admitting: Emergency Medicine

## 2024-04-09 ENCOUNTER — Other Ambulatory Visit: Payer: Self-pay

## 2024-04-09 ENCOUNTER — Emergency Department (HOSPITAL_BASED_OUTPATIENT_CLINIC_OR_DEPARTMENT_OTHER): Admitting: Radiology

## 2024-04-09 ENCOUNTER — Encounter (HOSPITAL_BASED_OUTPATIENT_CLINIC_OR_DEPARTMENT_OTHER): Payer: Self-pay

## 2024-04-09 DIAGNOSIS — R0602 Shortness of breath: Secondary | ICD-10-CM | POA: Insufficient documentation

## 2024-04-09 DIAGNOSIS — I10 Essential (primary) hypertension: Secondary | ICD-10-CM | POA: Diagnosis not present

## 2024-04-09 DIAGNOSIS — Z7982 Long term (current) use of aspirin: Secondary | ICD-10-CM | POA: Insufficient documentation

## 2024-04-09 DIAGNOSIS — R42 Dizziness and giddiness: Secondary | ICD-10-CM | POA: Diagnosis not present

## 2024-04-09 DIAGNOSIS — R0781 Pleurodynia: Secondary | ICD-10-CM | POA: Insufficient documentation

## 2024-04-09 DIAGNOSIS — Z79899 Other long term (current) drug therapy: Secondary | ICD-10-CM | POA: Diagnosis not present

## 2024-04-09 DIAGNOSIS — R079 Chest pain, unspecified: Secondary | ICD-10-CM

## 2024-04-09 LAB — BASIC METABOLIC PANEL WITH GFR
Anion gap: 13 (ref 5–15)
BUN: 19 mg/dL (ref 8–23)
CO2: 25 mmol/L (ref 22–32)
Calcium: 9.6 mg/dL (ref 8.9–10.3)
Chloride: 98 mmol/L (ref 98–111)
Creatinine, Ser: 1.36 mg/dL — ABNORMAL HIGH (ref 0.61–1.24)
GFR, Estimated: 59 mL/min — ABNORMAL LOW (ref >60)
Glucose, Bld: 128 mg/dL — ABNORMAL HIGH (ref 70–99)
Potassium: 4 mmol/L (ref 3.5–5.1)
Sodium: 136 mmol/L (ref 135–145)

## 2024-04-09 LAB — CBC
HCT: 48.1 % (ref 39.0–52.0)
Hemoglobin: 15.7 g/dL (ref 13.0–17.0)
MCH: 26.1 pg (ref 26.0–34.0)
MCHC: 32.6 g/dL (ref 30.0–36.0)
MCV: 80 fL (ref 80.0–100.0)
Platelets: 233 10*3/uL (ref 150–400)
RBC: 6.01 MIL/uL — ABNORMAL HIGH (ref 4.22–5.81)
RDW: 14.6 % (ref 11.5–15.5)
WBC: 7.6 10*3/uL (ref 4.0–10.5)
nRBC: 0 % (ref 0.0–0.2)

## 2024-04-09 LAB — TROPONIN T, HIGH SENSITIVITY
Troponin T High Sensitivity: <15 ng/L (ref <19)
Troponin T High Sensitivity: <15 ng/L (ref <19)

## 2024-04-09 MED ORDER — MORPHINE SULFATE (PF) 2 MG/ML IV SOLN
2.0000 mg | Freq: Once | INTRAVENOUS | Status: AC
  Administered 2024-04-09: 2 mg via INTRAVENOUS
  Filled 2024-04-09: qty 1

## 2024-04-09 MED ORDER — SODIUM CHLORIDE 0.9 % IV BOLUS
1000.0000 mL | Freq: Once | INTRAVENOUS | Status: AC
  Administered 2024-04-09: 1000 mL via INTRAVENOUS

## 2024-04-09 NOTE — Telephone Encounter (Signed)
 Copied from CRM [id number]. Topic: Clinical - Red Word Triage >> Apr 09, 2024  8:26 AM Clydene Darner H wrote: Red Word that prompted transfer to Nurse Triage: Patient called to schedule an appointment. He was previously prescribed medication for a cough related to his pain. This morning, after getting out of the shower, he continued to experience persistent chest pain. Additionally, he reported feeling dizzy last night while bending over. He checked his blood pressure, which was 136/86.   Chief Complaint: chest pain Symptom: dizziness and shortness of breath Frequency: constant Pertinent Negatives: Patient denies fever Disposition: [] ED /[] Urgent Care (no appt availability in office) / [] Appointment(In office/virtual)/ []  [place] [facility]/ [] Home Care/ [] Refused Recommended Disposition /[] [place] [facility]/ []  Follow-up with PCP Additional Notes: Pt reports central chest pain  that is worse when taking deep breaths. Seen in office for the same 3 days ago, but reports pain has worsened and is more persistent. RN advising ED. Pt is agreeable.    Reason for Disposition  Taking a deep breath makes pain worse  Answer Assessment - Initial Assessment Questions 1. LOCATION: "Where does it hurt?"       Middle of chest  2. RADIATION: "Does the pain go anywhere else?" (e.g., into neck, jaw, arms, back)     No  3. ONSET: "When did the chest pain begin?" (Minutes, hours or days)      Days  4. PATTERN: "Does the pain come and go, or has it been constant since it started?"  "Does it get worse with exertion?"      Off and on  5. DURATION: "How long does it last" (e.g., seconds, minutes, hours)     *No Answer* 6. SEVERITY: "How bad is the pain?"  (e.g., Scale 1-10; mild, moderate, or severe)    - MILD (1-3): doesn't interfere with normal activities     - MODERATE (4-7): interferes with normal activities or awakens from sleep    - SEVERE (8-10): excruciating pain, unable to do any normal  activities       7-8/10  7. CARDIAC RISK FACTORS: "Do you have any history of heart problems or risk factors for heart disease?" (e.g., angina, prior heart attack; diabetes, high blood pressure, high cholesterol, smoker, or strong family history of heart disease)     High cholersterol and high blood pressure  8. PULMONARY RISK FACTORS: "Do you have any history of lung disease?"  (e.g., blood clots in lung, asthma, emphysema, birth control pills)     No  9. CAUSE: "What do you think is causing the chest pain?"     Unsure  10. OTHER SYMPTOMS: "Do you have any other symptoms?" (e.g., dizziness, nausea, vomiting, sweating, fever, difficulty breathing, cough)       Dizziness, fatigue, cough  \line  Protocols used: Chest Pain-A-AH

## 2024-04-09 NOTE — ED Provider Notes (Signed)
 [place] EMERGENCY DEPARTMENT AT [facility] Provider Note   CSN: [id number] Arrival date & time: 04/09/24  6213     History HTN Chief Complaint  Patient presents with   Chest Pain   Shortness of Breath   Dizziness    Daniel [name] is a 63 y.o. male.  63 year old male with a past medical history of HTN presents to the ED with a chief complaint of substernal chest pain that is been ongoing for the past 3 days.  He describes this as intermittent, waxing and waning in nature.  He was evaluated by his PCP a few days ago, started on diclofenac, he reports he has been taking this medication without much improvement in symptoms.  He had 2 separate episodes where he stood up in the middle the kitchen and got very dizzy.  Subsequently, on Sunday he was at church when suddenly he felt clammy all over, he was sweating throughout and had to sit down because he felt like he was going to pass out.  This morning he endorses the pain worsening along with having a hard time taking a deep breath.  Denies any fever, no prior history of pulmonary [place], no prior history of CAD.  The history is provided by the patient.  Chest Pain Pain location:  Substernal area Pain quality: aching   Pain radiates to:  Does not radiate Pain severity:  Moderate Onset quality:  Sudden Duration:  3 days Timing:  Intermittent Progression:  Unchanged Chronicity:  New Context: breathing   Relieved by:  Nothing Worsened by:  Nothing Ineffective treatments:  None tried Associated symptoms: dizziness and shortness of breath   Associated symptoms: no abdominal pain, no back pain, no fever, no nausea and no vomiting   Shortness of Breath Associated symptoms: chest pain   Associated symptoms: no abdominal pain, no fever, no sore throat and no vomiting   Dizziness Associated symptoms: chest pain and shortness of breath   Associated symptoms: no nausea and no vomiting        Home Medications Prior to  Admission medications   Medication Sig Start Date End Date Taking? Authorizing Provider  amLODipine  (NORVASC ) 10 MG tablet Take 1 tablet (10 mg total) by mouth daily. 12/30/23   Donnie Galea, MD  aspirin  EC 81 MG tablet Take 81 mg by mouth daily. Swallow whole.    [provider]  benzonatate  (TESSALON  PERLES) 100 MG capsule Take 1 capsule (100 mg total) by mouth 3 (three) times daily as needed for cough (swallow, don't chew). 04/06/24   Claire Crick, MD  hydrochlorothiazide  (HYDRODIURIL ) 25 MG tablet Take 1 tablet (25 mg total) by mouth daily. 01/22/24   Donnie Galea, MD  losartan  (COZAAR ) 100 MG tablet Take 1 tablet (100 mg total) by mouth daily. 12/30/23   Donnie Galea, MD  pantoprazole  (PROTONIX ) 40 MG tablet Take 1 tablet (40 mg total) by mouth daily. 12/30/23   Donnie Galea, MD  pravastatin  (PRAVACHOL ) 40 MG tablet Take 1 tablet (40 mg total) by mouth daily. 12/30/23   Donnie Galea, MD      Allergies    Oxycodone and Lisinopril    Review of Systems   Review of Systems  Constitutional:  Negative for chills and fever.  HENT:  Negative for sore throat.   Respiratory:  Positive for shortness of breath.   Cardiovascular:  Positive for chest pain.  Gastrointestinal:  Negative for abdominal pain, nausea and vomiting.  Genitourinary:  Negative for  flank pain.  Musculoskeletal:  Negative for back pain.  Neurological:  Positive for dizziness.  All other systems reviewed and are negative.   Physical Exam Updated Vital Signs BP (!) 141/100   Pulse 68   Temp 98.5 F (36.9 C)   Resp 18   Ht 5\' 11"  (1.803 m)   Wt 125 kg   SpO2 100%   BMI 38.43 kg/m  Physical Exam Vitals and nursing note reviewed.  Constitutional:      Appearance: He is well-developed.  HENT:     Head: Normocephalic and atraumatic.  Eyes:     General: No scleral icterus.    Pupils: Pupils are equal, round, and reactive to light.  Cardiovascular:     Rate and Rhythm: Normal rate.      Heart sounds: Normal heart sounds.     Comments: No signs of pitting edema.  No calf tenderness. Pulmonary:     Effort: Pulmonary effort is normal.     Breath sounds: Normal breath sounds. No wheezing.     Comments: No wheezing, rhonchi or rales. Chest:     Chest wall: No tenderness.  Abdominal:     General: Bowel sounds are normal. There is no distension.     Palpations: Abdomen is soft.     Tenderness: There is no abdominal tenderness.  Musculoskeletal:        General: No tenderness or deformity.     Cervical back: Normal range of motion.  Skin:    General: Skin is warm and dry.  Neurological:     Mental Status: He is alert and oriented to person, place, and time.     ED Results / Procedures / Treatments   Labs (all labs ordered are listed, but only abnormal results are displayed) Labs Reviewed  BASIC METABOLIC PANEL WITH GFR - Abnormal; Notable for the following components:      Result Value   Glucose, Bld 128 (*)    Creatinine, Ser 1.36 (*)    GFR, Estimated 59 (*)    All other components within normal limits  CBC - Abnormal; Notable for the following components:   RBC 6.01 (*)    All other components within normal limits  TROPONIN T, HIGH SENSITIVITY  TROPONIN T, HIGH SENSITIVITY    EKG EKG Interpretation Date/Time:  Friday April 09 2024 09:32:49 EDT Ventricular Rate:  96 PR Interval:  154 QRS Duration:  102 QT Interval:  334 QTC Calculation: 422 R Axis:   76  Text Interpretation: Sinus rhythm Consider left atrial enlargement  Repola abnormalities seen on prior tracings, T wave inversions inferior leads unchanged Confirmed by Jerald Molly [phone]) on 04/09/2024 11:40:05 AM  Radiology DG Chest 2 View Result Date: 04/09/2024 CLINICAL DATA:  Chest pain, shortness of breath. EXAM: CHEST - 2 VIEW COMPARISON:  April 06, 2024. FINDINGS: The heart size and mediastinal contours are within normal limits. Both lungs are clear. The visualized skeletal structures are  unremarkable. IMPRESSION: No active cardiopulmonary disease. Electronically Signed   By: Rosalene Colon M.D.   On: 04/09/2024 11:49    Procedures Procedures    Medications Ordered in ED Medications  morphine  (PF) 2 MG/ML injection 2 mg (2 mg Intravenous Given 04/09/24 1048)  sodium chloride  0.9 % bolus 1,000 mL (0 mLs Intravenous Stopped 04/09/24 1439)    ED Course/ Medical Decision Making/ A&P Clinical Course as of 04/09/24 1524  Fri Apr 09, 2024  1510 Creatinine(!): 1.36 [JS]    Clinical Course User Index [  JS] Brande Uncapher, PA-C                                 Medical Decision Making Amount and/or Complexity of Data Reviewed Labs: ordered. Radiology: ordered.  Risk Prescription drug management.    This patient presents to the ED for concern of chest pain, this involves a number of treatment options, and is a complaint that carries with it a high risk of complications and morbidity.  The differential diagnosis includes ACS, PE versus dissection versus MSK.    Co morbidities: Discussed in HPI   Brief History:  See HPI.   EMR reviewed including pt PMHx, past surgical history and past visits to ER.   See HPI for more details   Lab Tests:  I ordered and independently interpreted labs.  The pertinent results include:    I personally reviewed all laboratory work and imaging. Metabolic panel without any acute abnormality specifically kidney function within normal limits and no significant electrolyte abnormalities. CBC without leukocytosis or significant anemia.   Imaging Studies:  NAD. I personally reviewed all imaging studies and no acute abnormality found. I agree with radiology interpretation.  Cardiac Monitoring:  The patient was maintained on a cardiac monitor.  I personally viewed and interpreted the cardiac monitored which showed an underlying rhythm of: EKG non-ischemic  Medicines ordered:  I ordered medication including BOLUS  for mild elevated  creatine Reevaluation of the patient after these medicines showed that the patient improved I have reviewed the patients home medicines and have made adjustments as needed  Reevaluation:  After the interventions noted above I re-evaluated patient and found that they have :improved  Social Determinants of Health:  The patient's social determinants of health were a factor in the care of this patient  Problem List / ED Course:  Patient presents to the ED with ongoing chest pain for the past 3 days, evaluated by PCP and placed on diclofenac for MSK pain.  He is currently employed as a Chief Technology Officer, does do a good amount of labor but reports there has not been any increase in activities but he is pretty active.  He has had 2 separate episodes where he is experienced some lightheadedness, feeling woozy, has broke into a sweat and felt clammy.  He reports both times he broke into a generalized sweat.  He did have a prior visit to the emergency department with chest pain in the month of December he had multiple studies including a CT angio that revealed no pulmonary embolism, dissection study was also performed which did not show any signs of dissection.  He does have a prior left heart cath from 5 years ago which showed I will coronary disease.  He has not follow-up with a cardiologist since. In the emergency department today his blood work has been unremarkable, CBC with no leukocytosis, hemoglobin is stable.  BMP without any electrolyte derangement, creatinine level is slightly elevated.  Troponin x 2 has been negative.  Chest x-ray without any signs of pneumonia, or pleural effusion. EKG appears nonischemic, has pain has improved since arrival to the emergency department.  He did receive hydration.  His heart score is approximately 1.  I discussed this case with my attending Dr. Lavonne Prairie does not feel that patient warrants further workup at this time.   Patient does feel pain has now subsided, he  is going to follow-up with his primary care  physician. Patient is agreeable to plan treatment, return precautions discussed at length, patient stable for discharge.  Dispostion:  After consideration of the diagnostic results and the patients response to treatment, I feel that the patent would benefit from outpatient follow-up cardiology.    Portions of this note were generated with Scientist, clinical (histocompatibility and immunogenetics). Dictation errors may occur despite best attempts at proofreading.   Final Clinical Impression(s) / ED Diagnoses Final diagnoses:  Chest pain, unspecified type    Rx / DC Orders ED Discharge Orders     None         Luellen Sages, PA-C 04/09/24 1524    Arvilla Birmingham, MD 04/10/24 [phone]

## 2024-04-09 NOTE — ED Notes (Signed)
 Steady gait, up to b/r.

## 2024-04-09 NOTE — ED Triage Notes (Signed)
 Arrives POV with complaints of worsening chest pain, dizziness, and shortness of breath x3 days. Patient rates pain a 8/10. Seen earlier this week at his PCP for the same with no findings.

## 2024-04-09 NOTE — Telephone Encounter (Signed)
 Noted, will await ER eval.  Thanks.

## 2024-04-09 NOTE — Discharge Instructions (Signed)
 Your laboratories also are within normal limits today, please follow-up with your cardiologist at your earliest convenience.

## 2024-04-12 ENCOUNTER — Other Ambulatory Visit: Payer: Self-pay | Admitting: Family Medicine

## 2024-04-12 ENCOUNTER — Encounter: Payer: Self-pay | Admitting: Family Medicine

## 2024-04-12 DIAGNOSIS — R0602 Shortness of breath: Secondary | ICD-10-CM

## 2024-04-12 DIAGNOSIS — R0789 Other chest pain: Secondary | ICD-10-CM

## 2024-04-24 ENCOUNTER — Other Ambulatory Visit: Payer: Self-pay | Admitting: Family Medicine

## 2024-05-13 ENCOUNTER — Ambulatory Visit: Payer: Self-pay | Admitting: Family Medicine

## 2024-05-13 ENCOUNTER — Encounter: Payer: Self-pay | Admitting: Family Medicine

## 2024-05-13 ENCOUNTER — Ambulatory Visit: Admitting: Family Medicine

## 2024-05-13 ENCOUNTER — Ambulatory Visit
Admission: RE | Admit: 2024-05-13 | Discharge: 2024-05-13 | Disposition: A | Discharge status: Home / Self Care | Source: Ambulatory Visit | Attending: Family Medicine | Admitting: Family Medicine

## 2024-05-13 ENCOUNTER — Encounter: Payer: Self-pay | Admitting: *Deleted

## 2024-05-13 VITALS — BP 146/94 | HR 91 | Temp 97.9°F | Ht 71.0 in | Wt 274.4 lb

## 2024-05-13 DIAGNOSIS — R1031 Right lower quadrant pain: Secondary | ICD-10-CM

## 2024-05-13 LAB — COMPREHENSIVE METABOLIC PANEL WITH GFR
ALT: 15 U/L (ref 0–53)
AST: 13 U/L (ref 0–37)
Albumin: 4.4 g/dL (ref 3.5–5.2)
Alkaline Phosphatase: 52 U/L (ref 39–117)
BUN: 24 mg/dL — ABNORMAL HIGH (ref 6–23)
CO2: 31 meq/L (ref 19–32)
Calcium: 9.5 mg/dL (ref 8.4–10.5)
Chloride: 99 meq/L (ref 96–112)
Creatinine, Ser: 1.4 mg/dL (ref 0.40–1.50)
GFR: 53.67 mL/min — ABNORMAL LOW (ref >60.00)
Glucose, Bld: 101 mg/dL — ABNORMAL HIGH (ref 70–99)
Potassium: 4 meq/L (ref 3.5–5.1)
Sodium: 137 meq/L (ref 135–145)
Total Bilirubin: 0.6 mg/dL (ref 0.2–1.2)
Total Protein: 8.2 g/dL (ref 6.0–8.3)

## 2024-05-13 LAB — CBC WITH DIFFERENTIAL/PLATELET
Basophils Absolute: 0 10*3/uL (ref 0.0–0.1)
Basophils Relative: 0.6 % (ref 0.0–3.0)
Eosinophils Absolute: 0.2 10*3/uL (ref 0.0–0.7)
Eosinophils Relative: 2.7 % (ref 0.0–5.0)
HCT: 46.8 % (ref 39.0–52.0)
Hemoglobin: 15.1 g/dL (ref 13.0–17.0)
Lymphocytes Relative: 32.3 % (ref 12.0–46.0)
Lymphs Abs: 2.2 10*3/uL (ref 0.7–4.0)
MCHC: 32.3 g/dL (ref 30.0–36.0)
MCV: 79.2 fl (ref 78.0–100.0)
Monocytes Absolute: 0.7 10*3/uL (ref 0.1–1.0)
Monocytes Relative: 9.6 % (ref 3.0–12.0)
Neutro Abs: 3.7 10*3/uL (ref 1.4–7.7)
Neutrophils Relative %: 54.8 % (ref 43.0–77.0)
Platelets: 249 10*3/uL (ref 150.0–400.0)
RBC: 5.91 Mil/uL — ABNORMAL HIGH (ref 4.22–5.81)
RDW: 15.9 % — ABNORMAL HIGH (ref 11.5–15.5)
WBC: 6.8 10*3/uL (ref 4.0–10.5)

## 2024-05-13 MED ORDER — IOHEXOL 300 MG/ML  SOLN
100.0000 mL | Freq: Once | INTRAMUSCULAR | Status: AC | PRN
Start: 2024-05-13 — End: 2024-05-13
  Administered 2024-05-13: 100 mL via INTRAVENOUS

## 2024-05-13 NOTE — Progress Notes (Signed)
 Patient ID: Daniel [name], male    DOB: [birth year], 63 y.o.   MRN: [id number]  This visit was conducted in person.  BP (!) 146/94 (BP Location: Left Arm, Patient Position: Sitting, Cuff Size: Large)   Pulse 91   Temp 97.9 F (36.6 C) (Oral)   Ht 5\' 11"  (1.803 m)   Wt 274 lb 6 oz (124.5 kg)   SpO2 99%   BMI 38.27 kg/m    CC:  Chief Complaint  Patient presents with   Abdominal Pain   Bad Taste in Mouth    Subjective:   HPI: Daniel [name] is a 64 y.o. male patient of Dr. Harrel Lim with history of  GERD, esophageal stricturepresenting on 05/13/2024 for Abdominal Pain and Bad Taste in Mouth    Starting 2 days ago... after lunch.. started pain in right  lower abdomen 10/10.. fried chicken  Constant pain for 3-4 hours.  Had diarrhea, no blood in stool... 3 times that day.  Next morning pain felt better... but still ache in RLQ... current pain 3-4 out of 10.  Has  chalky taste in mouth.   BMs back to being regular.   No GERD on pantoprazole    Having spells of similar pain once a week.    He has had similar issue  several  months ago. 08/2023  US  showed no stones but distended.  CBC, lipase and  CMET nml.    CT chest for chest pain ..  11/2024 Hepatobiliary: No focal liver abnormality is seen. No gallstones, gallbladder wall thickening, or biliary dilatation.   Pancreas: Unremarkable. No pancreatic ductal dilatation or surrounding inflammatory changes.   Spleen: Normal in size without focal abnormality.   Adrenals/Urinary Tract: There is an 11 mm hypodensity in the anterior left kidney which may represent a cyst or complex cysts. Otherwise, the kidneys, adrenal glands and bladder are within normal limits.   Stomach/Bowel: Stomach is within normal limits. Appendix is not visualized. No evidence of bowel wall thickening, distention, or inflammatory changes.  2022  colonoscopy hx of cecal adenoma.. s/p  hemicolectomy and appendectomy for this.   Relevant past  medical, surgical, family and social history reviewed and updated as indicated. Interim medical history since our last visit reviewed. Allergies and medications reviewed and updated. Outpatient Medications Prior to Visit  Medication Sig Dispense Refill   amLODipine  (NORVASC ) 10 MG tablet Take 1 tablet (10 mg total) by mouth daily. 90 tablet 3   aspirin  EC 81 MG tablet Take 81 mg by mouth daily. Swallow whole.     benzonatate  (TESSALON  PERLES) 100 MG capsule Take 1 capsule (100 mg total) by mouth 3 (three) times daily as needed for cough (swallow, don't chew). 30 capsule 1   hydrochlorothiazide  (HYDRODIURIL ) 25 MG tablet Take 1 tablet (25 mg total) by mouth daily. 90 tablet 3   losartan  (COZAAR ) 100 MG tablet Take 1 tablet (100 mg total) by mouth daily. 90 tablet 3   pantoprazole  (PROTONIX ) 40 MG tablet Take 1 tablet (40 mg total) by mouth daily. 90 tablet 3   pravastatin  (PRAVACHOL ) 40 MG tablet TAKE 1 TABLET BY MOUTH EVERY DAY 30 tablet 3   No facility-administered medications prior to visit.     Per HPI unless specifically indicated in ROS section below Review of Systems  Constitutional:  Negative for fatigue and fever.  HENT:  Negative for ear pain.   Eyes:  Negative for pain.  Respiratory:  Negative for cough and shortness of breath.   Cardiovascular:  Negative for chest pain, palpitations and leg swelling.  Gastrointestinal:  Positive for abdominal pain and nausea. Negative for constipation and vomiting.  Genitourinary:  Negative for dysuria.  Musculoskeletal:  Negative for arthralgias.  Neurological:  Negative for syncope, light-headedness and headaches.  Psychiatric/Behavioral:  Negative for dysphoric mood.    Objective:  BP (!) 146/94 (BP Location: Left Arm, Patient Position: Sitting, Cuff Size: Large)   Pulse 91   Temp 97.9 F (36.6 C) (Oral)   Ht 5\' 11"  (1.803 m)   Wt 274 lb 6 oz (124.5 kg)   SpO2 99%   BMI 38.27 kg/m   Wt Readings from Last 3 Encounters:  05/13/24  274 lb 6 oz (124.5 kg)  04/09/24 275 lb 9.2 oz (125 kg)  04/06/24 275 lb 8 oz (125 kg)      Physical Exam Vitals reviewed.  Constitutional:      Appearance: He is well-developed.  HENT:     Head: Normocephalic.     Right Ear: Hearing normal.     Left Ear: Hearing normal.     Nose: Nose normal.  Neck:     Thyroid : No thyroid  mass or thyromegaly.     Vascular: No carotid bruit.     Trachea: Trachea normal.  Cardiovascular:     Rate and Rhythm: Normal rate and regular rhythm.     Pulses: Normal pulses.     Heart sounds: Heart sounds not distant. No murmur heard.    No friction rub. No gallop.     Comments: No peripheral edema Pulmonary:     Effort: Pulmonary effort is normal. No respiratory distress.     Breath sounds: Normal breath sounds.  Abdominal:     Tenderness: There is abdominal tenderness in the right lower quadrant. There is no right CVA tenderness, left CVA tenderness, guarding or rebound. Negative signs include Murphy's sign and obturator sign.     Hernia: No hernia is present.  Skin:    General: Skin is warm and dry.     Findings: No rash.  Psychiatric:        Speech: Speech normal.        Behavior: Behavior normal.        Thought Content: Thought content normal.       Results for orders placed or performed during the hospital encounter of 04/09/24  Basic metabolic panel   Collection Time: 04/09/24  9:36 AM  Result Value Ref Range   Sodium 136 135 - 145 mmol/L   Potassium 4.0 3.5 - 5.1 mmol/L   Chloride 98 98 - 111 mmol/L   CO2 25 22 - 32 mmol/L   Glucose, Bld 128 (H) 70 - 99 mg/dL   BUN 19 8 - 23 mg/dL   Creatinine, Ser 1.61 (H) 0.61 - 1.24 mg/dL   Calcium 9.6 8.9 - 09.6 mg/dL   GFR, Estimated 59 (L) >60 mL/min   Anion gap 13 5 - 15  CBC   Collection Time: 04/09/24  9:36 AM  Result Value Ref Range   WBC 7.6 4.0 - 10.5 K/uL   RBC 6.01 (H) 4.22 - 5.81 MIL/uL   Hemoglobin 15.7 13.0 - 17.0 g/dL   HCT 04.5 40.9 - 81.1 %   MCV 80.0 80.0 - 100.0 fL    MCH 26.1 26.0 - 34.0 pg   MCHC 32.6 30.0 - 36.0 g/dL   RDW 91.4 78.2 - 95.6 %   Platelets 233 150 - 400 K/uL   nRBC 0.0 0.0 - 0.2 %  Troponin T, High Sensitivity   Collection Time: 04/09/24  9:36 AM  Result Value Ref Range   Troponin T High Sensitivity <15 <19 ng/L  Troponin T, High Sensitivity   Collection Time: 04/09/24 11:32 AM  Result Value Ref Range   Troponin T High Sensitivity <15 <19 ng/L    Assessment and Plan  RLQ abdominal pain  Right lower quadrant abdominal pain Assessment & Plan: Acute, recurrent Severe pain following meals but right lower quadrant History of cecal adenoma status post hemicolectomy and appendectomy for removal of this. In evaluation of this chronic pain previously lab work has been normal but ultrasound did show distended gallbladder.  No stones Additional CT showed normal gallbladder earlier in 2025.  Differential includes atypical diverticulitis versus atypical gallbladder disease versus other colitis etc. Will reevaluate with lab work and move forward with stat CT abdomen and pelvis to visualize right lower quadrant. If CT scan is unremarkable we can evaluate gallbladder further with HIDA scan.  Return and ER precautions provided   Orders: -     CBC with Differential/Platelet -     Comprehensive metabolic panel with GFR -     CT ABDOMEN PELVIS W CONTRAST; Future    No follow-ups on file.   Herby Lolling, MD

## 2024-05-13 NOTE — Assessment & Plan Note (Signed)
 Acute, recurrent Severe pain following meals but right lower quadrant History of cecal adenoma status post hemicolectomy and appendectomy for removal of this. In evaluation of this chronic pain previously lab work has been normal but ultrasound did show distended gallbladder.  No stones Additional CT showed normal gallbladder earlier in 2025.  Differential includes atypical diverticulitis versus atypical gallbladder disease versus other colitis etc. Will reevaluate with lab work and move forward with stat CT abdomen and pelvis to visualize right lower quadrant. If CT scan is unremarkable we can evaluate gallbladder further with HIDA scan.  Return and ER precautions provided

## 2024-05-25 ENCOUNTER — Ambulatory Visit: Payer: Self-pay | Admitting: Family Medicine

## 2024-05-25 ENCOUNTER — Ambulatory Visit
Admission: RE | Admit: 2024-05-25 | Discharge: 2024-05-25 | Disposition: A | Discharge status: Home / Self Care | Source: Ambulatory Visit | Attending: Family Medicine | Admitting: Family Medicine

## 2024-05-25 DIAGNOSIS — R1031 Right lower quadrant pain: Secondary | ICD-10-CM | POA: Diagnosis not present

## 2024-05-25 MED ORDER — TECHNETIUM TC 99M MEBROFENIN IV KIT
5.0000 | PACK | Freq: Once | INTRAVENOUS | Status: AC | PRN
  Administered 2024-05-25: 5.41 via INTRAVENOUS

## 2024-06-13 ENCOUNTER — Telehealth: Payer: Self-pay | Admitting: Family Medicine

## 2024-06-13 NOTE — Telephone Encounter (Signed)
 Please update patient.  Good news.  He had a possible cyst on his kidney that was seen back in December.  He was due for a follow-up ultrasound in June, but I do not think he needs to do that since he had a CT of his abdomen and pelvis done at the end of May.  That CT showed simple left kidney cysts and it does not appear he needs to do anything else about that.  I did not order the follow-up ultrasound.  FYI to patient.  Thanks.

## 2024-06-15 NOTE — Telephone Encounter (Signed)
 Patient notified

## 2024-08-27 NOTE — Progress Notes (Signed)
 Cardiology Office Note:    Date:  09/02/2024   ID:  Daniel [name], DOB 1961-03-04, MRN [id number]  PCP:  Daniel Arlyss RAMAN, MD   [facility] Providers Cardiologist:  None     Referring MD: Daniel Arlyss RAMAN, MD   Chief Complaint  Patient presents with   Shortness of Breath    History of Present Illness:    Daniel [name] is a 63 y.o. male seen at the request of Daniel Daniel for evaluation of dyspnea on exertion. He has a history of HTN, HLD, prediabetes. Myoview  in 2012 showed normal perfusion but EF 41%. Subsequent Echo in 2013 showed normal EF. Repeat Echo in 2018 also showed normal EF, mild LAE,normal valves. He had cardiac cath in 2020 showing minimal CAD, normal EF, normal LVEDP. Was seen in past by Daniel [name] for mediastinal mass felt to be hyperplastic. No surgery indicated.   He was seen in ED in April with atypical chest pain. Troponins, Ecg and CXR normal. He denies any chest pain currently other than a pinched nerve in his neck that is giving him a lot of should pain and numbness in his left arm. He has noted some SOB at times especially in shower. Note occasional cough in am. No swelling. He is fairly active at work. Nonsmoker. Notes BP at home is usually normal but has gone up with his shoulder pain.  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 every once in a while  (he is an exPsychologist, sport and exercise)   Prostate cancer (HCC)    Tubular adenoma of colon 11/2011   Villous adenoma of right colon 03/12/2012   Laparoscopic right colectomy 02/25/2012     Past Surgical History:  Procedure Laterality Date   APPENDECTOMY  2013   COLON RESECTION  02/25/2012   Procedure: LAPAROSCOPIC RIGHT COLON RESECTION;  Surgeon: Daniel CHRISTELLA Pacini, MD;  Location: [facility];  Service: General;  Laterality: Right;  laparoscopic assisted right colectomy   COLONOSCOPY  11/21/11   LAPAROSCOPIC RIGHT COLON RESECTION  2013   adenomatous polyp   LEFT HEART CATH AND CORONARY ANGIOGRAPHY N/A 01/28/2019   Procedure: LEFT HEART CATH AND CORONARY ANGIOGRAPHY;  Surgeon: Daniel Alm ORN, MD;  Location: [facility] INVASIVE CV LAB;  Service: Cardiovascular;  Laterality: N/A;   LYMPHADENECTOMY Bilateral 04/22/2019   Procedure: LYMPHADENECTOMY, PELVIC;  Surgeon: Daniel Glance, MD;  Location: [facility];  Service: Urology;  Laterality: Bilateral;   PROSTATE BIOPSY     ROBOT ASSISTED LAPAROSCOPIC RADICAL PROSTATECTOMY N/A 04/22/2019  Procedure: XI ROBOTIC ASSISTED LAPAROSCOPIC RADICAL PROSTATECTOMY LEVEL 3;  Surgeon: Daniel Glance, MD;  Location: [facility];  Service: Urology;  Laterality: N/A;  ONLY NEEDS 210 MIN FOR ALL PROCEDURES    Current Medications: Current Meds  Medication Sig   amLODipine  (NORVASC ) 10 MG tablet Take 1 tablet (10 mg total) by mouth daily.   aspirin  EC 81 MG tablet Take 81 mg by mouth daily. Swallow whole.   benzonatate  (TESSALON  PERLES) 100 MG capsule Take 1 capsule (100 mg total) by mouth 3 (three) times daily as needed for cough (swallow, don't chew).   hydrochlorothiazide  (HYDRODIURIL ) 25 MG tablet Take 1 tablet (25 mg total) by mouth daily.   losartan  (COZAAR ) 100  MG tablet Take 1 tablet (100 mg total) by mouth daily.   pantoprazole  (PROTONIX ) 40 MG tablet Take 1 tablet (40 mg total) by mouth daily.   pravastatin  (PRAVACHOL ) 40 MG tablet TAKE 1 TABLET BY MOUTH EVERY DAY     Allergies:   Oxycodone and Lisinopril   Social History   Socioeconomic History   Marital status: Married    Spouse name: Not on file   Number of children: 2   Years of education: Not on file   Highest education level: Not on file  Occupational History   Occupation: [employer]    Employer: UNEMPLOYED  Tobacco Use   Smoking status: Never   Smokeless tobacco: Never  Vaping Use   Vaping status: Never Used  Substance and Sexual Activity   Alcohol use: No   Drug use: No   Sexual activity: Yes  Other Topics Concern   Not on file  Social History Narrative   Worked for [employer] until 2014   Divorced 20+ years   Remarried 2017   Retired from [employer] 2019   Back working for [employer] as of 2022 [employer])   1 son/1 daughter   1 grandchild born 2019 Veterinary surgeon)   [employer] football, baseball, and basketball   Social Drivers of Corporate investment banker Strain: Not on file  Food Insecurity: Not on file  Transportation Needs: Not on file  Physical Activity: Not on file  Stress: Not on file  Social Connections: Not on file     Family History: The patient's family history includes Alcohol abuse in his father; Coronary artery disease in his mother; Heart disease in his brother; Heart disease (age of onset: 44) in his mother; Hypertension in his mother. There is no history of Colon cancer, Prostate cancer, Esophageal cancer, Rectal cancer, or Stomach cancer.  ROS:   Please see the history of present illness.     All other systems reviewed and are negative.  EKGs/Labs/Other Studies Reviewed:    The following studies were reviewed today: EKG Interpretation Date/Time:  Thursday September 02 2024 08:14:59 EDT Ventricular Rate:  78 PR  Interval:  172 QRS Duration:  92 QT Interval:  348 QTC Calculation: 396 R Axis:   66  Text Interpretation: Normal sinus rhythm ST & T wave abnormality, consider inferior ischemia When compared with ECG of 09-Apr-2024 09:32, ST depression is less in inferior leads. Confirmed by [place], Lita Flynn [phone]) on 09/02/2024 8:18:39 AM   Recent Labs: 04/06/2024: TSH 1.49 05/13/2024: ALT 15; BUN 24; Creatinine, Ser 1.40; Hemoglobin 15.1; Platelets 249.0; Potassium 4.0; Sodium 137  Recent Lipid Panel    Component Value Date/Time   CHOL 141 12/30/2023 0925   TRIG 53.0 12/30/2023 0925   HDL 41.30 12/30/2023 0925  CHOLHDL 3 12/30/2023 0925   VLDL 10.6 12/30/2023 0925   LDLCALC 89 12/30/2023 0925   LDLDIRECT 168.6 07/12/2013 0938     Risk Assessment/Calculations:           Physical Exam:    VS:  BP (!) 150/78   Pulse 78   Ht 5' 9 (1.753 m)   Wt 278 lb (126.1 kg)   SpO2 95%   BMI 41.05 kg/m     Wt Readings from Last 3 Encounters:  09/02/24 278 lb (126.1 kg)  05/13/24 274 lb 6 oz (124.5 kg)  04/09/24 275 lb 9.2 oz (125 kg)     GEN:  Well nourished, well developed in no acute distress HEENT: Normal NECK: No JVD; No carotid bruits LYMPHATICS: No lymphadenopathy CARDIAC: RRR, no murmurs, rubs, gallops RESPIRATORY:  Clear to auscultation without rales, wheezing or rhonchi  ABDOMEN: Soft, non-tender, non-distended MUSCULOSKELETAL:  No edema; No deformity  SKIN: Warm and dry NEUROLOGIC:  Alert and oriented x 3 PSYCHIATRIC:  Normal affect   ASSESSMENT:    1. SOB (shortness of breath)   2. Hypertension, unspecified type    PLAN:    In order of problems listed above:  SOB. Suspect this is more related to his weight and deconditioning. We will update Echo. Low suspicion for ischemia given prior cardiac cath results and lack of coronary calcification on CT last year. If Echo is Ok recommend increased aerobic activity and weight loss.  HTN. Reports good control at home. On losartan ,  HCT, amlodipine .           Medication Adjustments/Labs and Tests Ordered: Current medicines are reviewed at length with the patient today.  Concerns regarding medicines are outlined above.  Orders Placed This Encounter  Procedures   EKG 12-Lead   No orders of the defined types were placed in this encounter.   There are no Patient Instructions on file for this visit.   Signed, Ryann Pauli [place], MD  09/02/2024 8:30 AM    [facility]

## 2024-09-02 ENCOUNTER — Encounter: Payer: Self-pay | Admitting: Cardiology

## 2024-09-02 ENCOUNTER — Ambulatory Visit: Attending: Cardiology | Admitting: Cardiology

## 2024-09-02 VITALS — BP 150/78 | HR 78 | Ht 69.0 in | Wt 278.0 lb

## 2024-09-02 DIAGNOSIS — I10 Essential (primary) hypertension: Secondary | ICD-10-CM | POA: Diagnosis not present

## 2024-09-02 DIAGNOSIS — R0602 Shortness of breath: Secondary | ICD-10-CM

## 2024-09-02 NOTE — Patient Instructions (Signed)
 Medication Instructions:  Continue same medications  Lab Work: None ordered   Testing/Procedures: Echo  first available    Follow-Up: At [facility], you and your health needs are our priority.  As part of our continuing mission to provide you with exceptional heart care, our providers are all part of one team.  This team includes your primary Cardiologist (physician) and Advanced Practice Providers or APPs (Physician Assistants and Nurse Practitioners) who all work together to provide you with the care you need, when you need it.  Your next appointment:  To Be Determined    Provider:  Dr.Jordan    We recommend signing up for the patient portal called MyChart.  Sign up information is provided on this After Visit Summary.  MyChart is used to connect with patients for Virtual Visits (Telemedicine).  Patients are able to view lab/test results, encounter notes, upcoming appointments, etc.  Non-urgent messages can be sent to your provider as well.   To learn more about what you can do with MyChart, go to [email or web address].

## 2024-09-29 ENCOUNTER — Other Ambulatory Visit (INDEPENDENT_AMBULATORY_CARE_PROVIDER_SITE_OTHER)

## 2024-09-29 ENCOUNTER — Ambulatory Visit: Payer: Self-pay | Admitting: Cardiology

## 2024-09-29 DIAGNOSIS — R0602 Shortness of breath: Secondary | ICD-10-CM

## 2024-09-29 LAB — ECHOCARDIOGRAM COMPLETE
Area-P 1/2: 3.72 cm2
S' Lateral: 2.85 cm

## 2024-10-06 NOTE — Telephone Encounter (Signed)
 Pt returning call to a nurse

## 2024-10-18 ENCOUNTER — Encounter: Payer: Self-pay | Admitting: Radiology

## 2024-11-01 ENCOUNTER — Ambulatory Visit: Payer: Self-pay

## 2024-11-01 NOTE — Telephone Encounter (Signed)
Noted. Thanks. Will see at OV.  

## 2024-11-01 NOTE — Telephone Encounter (Signed)
 FYI Only or Action Required?: Action required by provider: request for appointment.  Patient was last seen in primary care on 05/13/2024 by Avelina Greig BRAVO, MD.  Called Nurse Triage reporting Abdominal Cramping and Abdominal Pain.  Symptoms began several days ago.  Interventions attempted: Nothing.  Symptoms are: gradually worsening.Pain lower belly. Comes and goes.  Triage Disposition: See Physician Within 24 Hours  Patient/caregiver understands and will follow disposition?: Yes     Copied from CRM #[id number]. Topic: Clinical - Red Word Triage >> Nov 01, 2024  8:11 AM Harlene ORN wrote: Red Word that prompted transfer to Nurse Triage: last three to four days abdominal pains in lower stomach and headaches at night, low BP 139 over 91 this morning, and dizziness Reason for Disposition  [1] MODERATE pain (e.g., interferes with normal activities) AND [2] pain comes and goes (cramps) AND [3] present > 24 hours  (Exception: Pain with Vomiting or Diarrhea - see that Guideline.)  Answer Assessment - Initial Assessment Questions 1. LOCATION: Where does it hurt?      Lower, middle 2. RADIATION: Does the pain shoot anywhere else? (e.g., chest, back)     no 3. ONSET: When did the pain begin? (Minutes, hours or days ago)      3-4 days 4. SUDDEN: Gradual or sudden onset?     gradual 5. PATTERN Does the pain come and go, or is it constant?     Comes and goes 6. SEVERITY: How bad is the pain?  (e.g., Scale 1-10; mild, moderate, or severe)     7 7. RECURRENT SYMPTOM: Have you ever had this type of stomach pain before? If Yes, ask: When was the last time? and What happened that time?      yea 8. CAUSE: What do you think is causing the stomach pain? (e.g., gallstones, recent abdominal surgery)     unsure 9. RELIEVING/AGGRAVATING FACTORS: What makes it better or worse? (e.g., antacids, bending or twisting motion, bowel movement)     no 10. OTHER SYMPTOMS: Do you have any  other symptoms? (e.g., back pain, diarrhea, fever, urination pain, vomiting)       Not eating well, dizziness, headache  139/91  Protocols used: Abdominal Pain - Male-A-AH

## 2024-11-02 ENCOUNTER — Encounter: Payer: Self-pay | Admitting: Family Medicine

## 2024-11-02 ENCOUNTER — Ambulatory Visit: Admitting: Family Medicine

## 2024-11-02 VITALS — BP 140/82 | HR 85 | Temp 98.1°F | Ht 69.0 in | Wt 279.8 lb

## 2024-11-02 DIAGNOSIS — R109 Unspecified abdominal pain: Secondary | ICD-10-CM

## 2024-11-02 NOTE — Progress Notes (Unsigned)
 Sx started 10/29/24.  Had an occipital HA.  Then had lower abd pain.  Nauseated.  Pain got better after getting out of bed and moving around.  Then had HA PM on 10/30/24 and got dizzy when standing/getting out of bed.  Had lower BP at the time, 100s/50, similar on recheck.    Sunday 10/31/24 AM, lower abd pain again but better after getting out of bed.  He felt lightheaded at church.  BP was 130s/80s on check at that point.    No syncope.    No abd pain in the evenings.  No pain yesterday.    No fevers.  No vomiting.  He usually doesn't eat breakfast.  His foods/meals have been at baseline in the meantime.  No bloody or black stools.  No dysuria.    He feels better today.  No clear trigger for HA.    Prev colonoscopy and abd CT d/w pt.    Abd pain was all below the navel, ie no RUQ pain.   Meds, vitals, and allergies reviewed.   ROS: Per HPI unless specifically indicated in ROS section   CN 2-12 wnl B, S/S wnl x4'

## 2024-11-02 NOTE — Patient Instructions (Addendum)
 Go to the lab on the way out.   If you have mychart we'll likely use that to update you.    Take care.  Glad to see you. If you have any more episodes with low BP, then cut amlodipine  in half.   Update me as needed.

## 2024-11-03 LAB — URINALYSIS, ROUTINE W REFLEX MICROSCOPIC
Bilirubin Urine: NEGATIVE
Hgb urine dipstick: NEGATIVE
Ketones, ur: NEGATIVE
Leukocytes,Ua: NEGATIVE
Nitrite: NEGATIVE
RBC / HPF: NONE SEEN (ref 0–?)
Specific Gravity, Urine: 1.015 (ref 1.000–1.030)
Total Protein, Urine: NEGATIVE
Urine Glucose: NEGATIVE
Urobilinogen, UA: 1 (ref 0.0–1.0)
WBC, UA: NONE SEEN (ref 0–?)
pH: 6 (ref 5.0–8.0)

## 2024-11-03 LAB — CBC WITH DIFFERENTIAL/PLATELET
Basophils Absolute: 0.1 K/uL (ref 0.0–0.1)
Basophils Relative: 1.2 % (ref 0.0–3.0)
Eosinophils Absolute: 0.2 K/uL (ref 0.0–0.7)
Eosinophils Relative: 2.6 % (ref 0.0–5.0)
HCT: 43.3 % (ref 39.0–52.0)
Hemoglobin: 14.1 g/dL (ref 13.0–17.0)
Lymphocytes Relative: 38.7 % (ref 12.0–46.0)
Lymphs Abs: 2.5 K/uL (ref 0.7–4.0)
MCHC: 32.4 g/dL (ref 30.0–36.0)
MCV: 79.4 fl (ref 78.0–100.0)
Monocytes Absolute: 0.6 K/uL (ref 0.1–1.0)
Monocytes Relative: 9 % (ref 3.0–12.0)
Neutro Abs: 3.1 K/uL (ref 1.4–7.7)
Neutrophils Relative %: 48.5 % (ref 43.0–77.0)
Platelets: 235 K/uL (ref 150.0–400.0)
RBC: 5.46 Mil/uL (ref 4.22–5.81)
RDW: 15.1 % (ref 11.5–15.5)
WBC: 6.4 K/uL (ref 4.0–10.5)

## 2024-11-03 LAB — COMPREHENSIVE METABOLIC PANEL WITH GFR
ALT: 12 U/L (ref 0–53)
AST: 15 U/L (ref 0–37)
Albumin: 4.1 g/dL (ref 3.5–5.2)
Alkaline Phosphatase: 50 U/L (ref 39–117)
BUN: 18 mg/dL (ref 6–23)
CO2: 31 meq/L (ref 19–32)
Calcium: 8.9 mg/dL (ref 8.4–10.5)
Chloride: 100 meq/L (ref 96–112)
Creatinine, Ser: 1.37 mg/dL (ref 0.40–1.50)
GFR: 54.89 mL/min — ABNORMAL LOW (ref >60.00)
Glucose, Bld: 77 mg/dL (ref 70–99)
Potassium: 4 meq/L (ref 3.5–5.1)
Sodium: 138 meq/L (ref 135–145)
Total Bilirubin: 0.4 mg/dL (ref 0.2–1.2)
Total Protein: 7.7 g/dL (ref 6.0–8.3)

## 2024-11-03 NOTE — Assessment & Plan Note (Signed)
 Unclear source.  See notes on labs.  Benign abdominal exam today.  Unclear if the issue causing the abdominal pain caused him to have transiently lower blood pressure.  I asked him to let me know if he has any future episodes.  At this point still okay for outpatient follow-up. If any more episodes with low BP, then cut amlodipine  in half and update me.  He agrees to plan.

## 2024-11-07 ENCOUNTER — Ambulatory Visit: Payer: Self-pay | Admitting: Family Medicine

## 2024-11-29 NOTE — Telephone Encounter (Signed)
 Copied from CRM #[id number]. Topic: Clinical - Medical Advice >> Nov 26, 2024  3:02 PM Winona R wrote: Pt returning office call and states he's feeling pretty good no complaints

## 2024-12-30 ENCOUNTER — Other Ambulatory Visit: Payer: Self-pay | Admitting: Family Medicine

## 2025-01-20 ENCOUNTER — Other Ambulatory Visit: Payer: Self-pay | Admitting: Family Medicine
